# Patient Record
Sex: Female | Born: 1950
Health system: Southern US, Community
[De-identification: ages and names within clinical notes are randomized; demographics above are authoritative.]

## PROBLEM LIST (undated history)

## (undated) DIAGNOSIS — N200 Calculus of kidney: Secondary | ICD-10-CM

## (undated) DIAGNOSIS — F32A Depression, unspecified: Secondary | ICD-10-CM

## (undated) DIAGNOSIS — I10 Essential (primary) hypertension: Secondary | ICD-10-CM

## (undated) DIAGNOSIS — G4700 Insomnia, unspecified: Secondary | ICD-10-CM

## (undated) DIAGNOSIS — G43909 Migraine, unspecified, not intractable, without status migrainosus: Secondary | ICD-10-CM

## (undated) DIAGNOSIS — C801 Malignant (primary) neoplasm, unspecified: Secondary | ICD-10-CM

## (undated) DIAGNOSIS — S0300XA Dislocation of jaw, unspecified side, initial encounter: Secondary | ICD-10-CM

## (undated) DIAGNOSIS — C4490 Unspecified malignant neoplasm of skin, unspecified: Secondary | ICD-10-CM

## (undated) DIAGNOSIS — T7840XA Allergy, unspecified, initial encounter: Secondary | ICD-10-CM

## (undated) DIAGNOSIS — Z87442 Personal history of urinary calculi: Secondary | ICD-10-CM

## (undated) DIAGNOSIS — F419 Anxiety disorder, unspecified: Secondary | ICD-10-CM

## (undated) DIAGNOSIS — C50919 Malignant neoplasm of unspecified site of unspecified female breast: Secondary | ICD-10-CM

## (undated) HISTORY — DX: Malignant (primary) neoplasm, unspecified: C80.1

## (undated) HISTORY — PX: TUBAL LIGATION: SHX77

## (undated) HISTORY — DX: Insomnia, unspecified: G47.00

## (undated) HISTORY — DX: Dislocation of jaw, unspecified side, initial encounter: S03.00XA

## (undated) HISTORY — DX: Depression, unspecified: F32.A

## (undated) HISTORY — PX: SKIN LESION EXCISION: SHX2412

## (undated) HISTORY — DX: Calculus of kidney: N20.0

## (undated) HISTORY — DX: Allergy, unspecified, initial encounter: T78.40XA

## (undated) HISTORY — DX: Unspecified malignant neoplasm of skin, unspecified: C44.90

## (undated) HISTORY — PX: COSMETIC SURGERY: SHX468

## (undated) HISTORY — DX: Essential (primary) hypertension: I10

## (undated) HISTORY — DX: Anxiety disorder, unspecified: F41.9

## (undated) HISTORY — DX: Migraine, unspecified, not intractable, without status migrainosus: G43.909

---

## 2000-01-27 ENCOUNTER — Other Ambulatory Visit: Admission: RE | Admit: 2000-01-27 | Discharge: 2000-01-27 | Payer: Self-pay | Admitting: Family Medicine

## 2001-06-13 ENCOUNTER — Other Ambulatory Visit: Admission: RE | Admit: 2001-06-13 | Discharge: 2001-06-13 | Payer: Self-pay | Admitting: Family Medicine

## 2003-07-30 ENCOUNTER — Other Ambulatory Visit: Admission: RE | Admit: 2003-07-30 | Discharge: 2003-07-30 | Payer: Self-pay | Admitting: Family Medicine

## 2003-11-04 HISTORY — PX: HEMORRHOID SURGERY: SHX153

## 2003-11-09 ENCOUNTER — Ambulatory Visit (HOSPITAL_COMMUNITY): Admission: RE | Admit: 2003-11-09 | Discharge: 2003-11-09 | Payer: Self-pay | Admitting: Gastroenterology

## 2003-11-09 ENCOUNTER — Encounter: Payer: Self-pay | Admitting: Family Medicine

## 2003-11-09 ENCOUNTER — Encounter (INDEPENDENT_AMBULATORY_CARE_PROVIDER_SITE_OTHER): Payer: Self-pay | Admitting: Specialist

## 2003-11-10 ENCOUNTER — Encounter: Payer: Self-pay | Admitting: Family Medicine

## 2004-08-26 ENCOUNTER — Other Ambulatory Visit: Admission: RE | Admit: 2004-08-26 | Discharge: 2004-08-26 | Payer: Self-pay | Admitting: Family Medicine

## 2004-08-26 ENCOUNTER — Ambulatory Visit: Payer: Self-pay | Admitting: Family Medicine

## 2004-11-12 ENCOUNTER — Ambulatory Visit: Payer: Self-pay | Admitting: Family Medicine

## 2005-06-15 ENCOUNTER — Ambulatory Visit: Payer: Self-pay | Admitting: Family Medicine

## 2006-02-15 ENCOUNTER — Ambulatory Visit: Payer: Self-pay | Admitting: Family Medicine

## 2006-02-15 ENCOUNTER — Other Ambulatory Visit: Admission: RE | Admit: 2006-02-15 | Discharge: 2006-02-15 | Payer: Self-pay | Admitting: Family Medicine

## 2006-02-15 ENCOUNTER — Encounter: Payer: Self-pay | Admitting: Family Medicine

## 2007-01-28 ENCOUNTER — Encounter: Payer: Self-pay | Admitting: Family Medicine

## 2007-01-28 DIAGNOSIS — E7800 Pure hypercholesterolemia, unspecified: Secondary | ICD-10-CM | POA: Insufficient documentation

## 2007-01-28 DIAGNOSIS — J309 Allergic rhinitis, unspecified: Secondary | ICD-10-CM | POA: Insufficient documentation

## 2007-01-28 DIAGNOSIS — M26609 Unspecified temporomandibular joint disorder, unspecified side: Secondary | ICD-10-CM | POA: Insufficient documentation

## 2007-01-28 DIAGNOSIS — F419 Anxiety disorder, unspecified: Secondary | ICD-10-CM | POA: Insufficient documentation

## 2007-01-28 DIAGNOSIS — J45909 Unspecified asthma, uncomplicated: Secondary | ICD-10-CM | POA: Insufficient documentation

## 2007-01-28 DIAGNOSIS — Z87898 Personal history of other specified conditions: Secondary | ICD-10-CM | POA: Insufficient documentation

## 2007-02-17 ENCOUNTER — Ambulatory Visit: Payer: Self-pay | Admitting: Family Medicine

## 2007-02-28 LAB — CONVERTED CEMR LAB
ALT: 25 units/L (ref 0–35)
Albumin: 4.5 g/dL (ref 3.5–5.2)
Alkaline Phosphatase: 85 units/L (ref 39–117)
Basophils Absolute: 0 10*3/uL (ref 0.0–0.1)
Cholesterol: 234 mg/dL (ref 0–200)
Eosinophils Absolute: 0.1 10*3/uL (ref 0.0–0.6)
Eosinophils Relative: 1.4 % (ref 0.0–5.0)
HDL: 42.8 mg/dL (ref >39.0)
Lymphocytes Relative: 28 % (ref 12.0–46.0)
MCHC: 34.7 g/dL (ref 30.0–36.0)
MCV: 90.6 fL (ref 78.0–100.0)
Monocytes Relative: 11.3 % — ABNORMAL HIGH (ref 3.0–11.0)
Neutro Abs: 2.6 10*3/uL (ref 1.4–7.7)
Platelets: 326 10*3/uL (ref 150–400)
RBC: 4.33 M/uL (ref 3.87–5.11)
TSH: 0.95 microintl units/mL (ref 0.35–5.50)
Triglycerides: 158 mg/dL — ABNORMAL HIGH (ref 0–149)
WBC: 4.5 10*3/uL (ref 4.5–10.5)

## 2007-03-15 ENCOUNTER — Encounter: Payer: Self-pay | Admitting: Family Medicine

## 2007-03-18 ENCOUNTER — Encounter (INDEPENDENT_AMBULATORY_CARE_PROVIDER_SITE_OTHER): Payer: Self-pay | Admitting: *Deleted

## 2007-03-21 ENCOUNTER — Encounter (INDEPENDENT_AMBULATORY_CARE_PROVIDER_SITE_OTHER): Payer: Self-pay | Admitting: *Deleted

## 2007-04-04 ENCOUNTER — Telehealth: Payer: Self-pay | Admitting: Family Medicine

## 2007-05-05 ENCOUNTER — Ambulatory Visit: Payer: Self-pay | Admitting: Internal Medicine

## 2007-05-09 ENCOUNTER — Telehealth: Payer: Self-pay | Admitting: Internal Medicine

## 2007-05-09 ENCOUNTER — Telehealth: Payer: Self-pay | Admitting: Family Medicine

## 2007-05-09 LAB — CONVERTED CEMR LAB
Basophils Relative: 0.3 % (ref 0.0–1.0)
Eosinophils Absolute: 0.1 10*3/uL (ref 0.0–0.6)
Eosinophils Relative: 2.1 % (ref 0.0–5.0)
HCT: 39.7 % (ref 36.0–46.0)
Hemoglobin: 13.8 g/dL (ref 12.0–15.0)
Lymphocytes Relative: 29.8 % (ref 12.0–46.0)
MCV: 91 fL (ref 78.0–100.0)
Neutro Abs: 3.5 10*3/uL (ref 1.4–7.7)
Neutrophils Relative %: 58.5 % (ref 43.0–77.0)
WBC: 5.8 10*3/uL (ref 4.5–10.5)

## 2007-07-14 ENCOUNTER — Telehealth: Payer: Self-pay | Admitting: Family Medicine

## 2007-09-07 ENCOUNTER — Telehealth: Payer: Self-pay | Admitting: Family Medicine

## 2007-10-18 ENCOUNTER — Encounter: Payer: Self-pay | Admitting: Family Medicine

## 2007-10-31 ENCOUNTER — Telehealth: Payer: Self-pay | Admitting: Family Medicine

## 2007-11-01 ENCOUNTER — Ambulatory Visit: Payer: Self-pay | Admitting: Family Medicine

## 2007-11-08 ENCOUNTER — Ambulatory Visit: Payer: Self-pay | Admitting: Licensed Clinical Social Worker

## 2007-11-18 ENCOUNTER — Ambulatory Visit: Payer: Self-pay | Admitting: Licensed Clinical Social Worker

## 2008-01-02 ENCOUNTER — Ambulatory Visit: Payer: Self-pay | Admitting: Licensed Clinical Social Worker

## 2008-01-10 ENCOUNTER — Ambulatory Visit: Payer: Self-pay | Admitting: Licensed Clinical Social Worker

## 2008-01-12 ENCOUNTER — Encounter: Payer: Self-pay | Admitting: Family Medicine

## 2008-01-12 ENCOUNTER — Emergency Department (HOSPITAL_COMMUNITY): Admission: EM | Admit: 2008-01-12 | Discharge: 2008-01-12 | Payer: Self-pay | Admitting: Emergency Medicine

## 2008-01-15 ENCOUNTER — Telehealth: Payer: Self-pay | Admitting: Family Medicine

## 2008-01-16 ENCOUNTER — Telehealth: Payer: Self-pay | Admitting: Family Medicine

## 2008-01-20 ENCOUNTER — Ambulatory Visit: Payer: Self-pay | Admitting: Family Medicine

## 2008-01-30 ENCOUNTER — Telehealth: Payer: Self-pay | Admitting: Family Medicine

## 2008-02-01 ENCOUNTER — Telehealth (INDEPENDENT_AMBULATORY_CARE_PROVIDER_SITE_OTHER): Payer: Self-pay | Admitting: *Deleted

## 2008-03-13 ENCOUNTER — Telehealth (INDEPENDENT_AMBULATORY_CARE_PROVIDER_SITE_OTHER): Payer: Self-pay | Admitting: *Deleted

## 2008-03-17 ENCOUNTER — Encounter: Payer: Self-pay | Admitting: Family Medicine

## 2008-06-04 ENCOUNTER — Telehealth: Payer: Self-pay | Admitting: Family Medicine

## 2008-08-14 ENCOUNTER — Other Ambulatory Visit: Admission: RE | Admit: 2008-08-14 | Discharge: 2008-08-14 | Payer: Self-pay | Admitting: Family Medicine

## 2008-08-14 ENCOUNTER — Ambulatory Visit: Payer: Self-pay | Admitting: Family Medicine

## 2008-08-14 ENCOUNTER — Encounter: Payer: Self-pay | Admitting: Family Medicine

## 2008-08-14 DIAGNOSIS — R1030 Lower abdominal pain, unspecified: Secondary | ICD-10-CM | POA: Insufficient documentation

## 2008-08-14 DIAGNOSIS — M545 Low back pain, unspecified: Secondary | ICD-10-CM | POA: Insufficient documentation

## 2008-08-14 DIAGNOSIS — R109 Unspecified abdominal pain: Secondary | ICD-10-CM

## 2008-08-14 LAB — HM PAP SMEAR

## 2008-08-15 ENCOUNTER — Encounter: Admission: RE | Admit: 2008-08-15 | Discharge: 2008-08-15 | Payer: Self-pay | Admitting: Family Medicine

## 2008-08-17 ENCOUNTER — Encounter: Payer: Self-pay | Admitting: Family Medicine

## 2008-08-17 ENCOUNTER — Encounter (INDEPENDENT_AMBULATORY_CARE_PROVIDER_SITE_OTHER): Payer: Self-pay | Admitting: *Deleted

## 2008-10-01 ENCOUNTER — Encounter: Payer: Self-pay | Admitting: Family Medicine

## 2009-01-03 ENCOUNTER — Telehealth: Payer: Self-pay | Admitting: Family Medicine

## 2009-05-06 ENCOUNTER — Telehealth: Payer: Self-pay | Admitting: Family Medicine

## 2009-07-09 ENCOUNTER — Telehealth: Payer: Self-pay | Admitting: Family Medicine

## 2009-07-30 ENCOUNTER — Encounter: Payer: Self-pay | Admitting: Family Medicine

## 2009-08-05 ENCOUNTER — Encounter (INDEPENDENT_AMBULATORY_CARE_PROVIDER_SITE_OTHER): Payer: Self-pay | Admitting: *Deleted

## 2009-09-26 ENCOUNTER — Ambulatory Visit: Payer: Self-pay | Admitting: Family Medicine

## 2009-09-26 DIAGNOSIS — D485 Neoplasm of uncertain behavior of skin: Secondary | ICD-10-CM | POA: Insufficient documentation

## 2009-09-30 LAB — HM COLONOSCOPY

## 2009-10-01 LAB — CONVERTED CEMR LAB
ALT: 28 units/L (ref 0–35)
Albumin: 4.7 g/dL (ref 3.5–5.2)
Alkaline Phosphatase: 89 units/L (ref 39–117)
Basophils Relative: 0.7 % (ref 0.0–3.0)
Bilirubin, Direct: 0 mg/dL (ref 0.0–0.3)
CO2: 31 meq/L (ref 19–32)
Calcium: 9.6 mg/dL (ref 8.4–10.5)
Chloride: 102 meq/L (ref 96–112)
Cholesterol: 215 mg/dL — ABNORMAL HIGH (ref 0–200)
Creatinine, Ser: 0.7 mg/dL (ref 0.4–1.2)
Eosinophils Relative: 1.5 % (ref 0.0–5.0)
Hemoglobin: 13.9 g/dL (ref 12.0–15.0)
Lymphocytes Relative: 29.5 % (ref 12.0–46.0)
MCHC: 33.4 g/dL (ref 30.0–36.0)
MCV: 94 fL (ref 78.0–100.0)
Neutro Abs: 2.9 10*3/uL (ref 1.4–7.7)
Neutrophils Relative %: 58.1 % (ref 43.0–77.0)
RBC: 4.42 M/uL (ref 3.87–5.11)
Sodium: 141 meq/L (ref 135–145)
Total CHOL/HDL Ratio: 4
Total Protein: 7.5 g/dL (ref 6.0–8.3)
VLDL: 34 mg/dL (ref 0.0–40.0)
WBC: 5 10*3/uL (ref 4.5–10.5)

## 2010-01-28 ENCOUNTER — Telehealth: Payer: Self-pay | Admitting: Family Medicine

## 2010-04-07 ENCOUNTER — Ambulatory Visit: Payer: Self-pay | Admitting: Family Medicine

## 2010-04-08 LAB — CONVERTED CEMR LAB
ALT: 50 units/L — ABNORMAL HIGH (ref 0–35)
AST: 46 units/L — ABNORMAL HIGH (ref 0–37)
Direct LDL: 132.7 mg/dL
Total CHOL/HDL Ratio: 5
VLDL: 33.4 mg/dL (ref 0.0–40.0)

## 2010-04-23 ENCOUNTER — Telehealth (INDEPENDENT_AMBULATORY_CARE_PROVIDER_SITE_OTHER): Payer: Self-pay | Admitting: *Deleted

## 2010-05-23 ENCOUNTER — Telehealth: Payer: Self-pay | Admitting: Family Medicine

## 2010-08-05 NOTE — Miscellaneous (Signed)
Summary: mammogram results  Clinical Lists Changes  Observations: Added new observation of MAMMO DUE: 08/2010 (08/05/2009 9:21) Added new observation of MAMMOGRAM: normal (08/05/2009 9:21)      Preventive Care Screening  Mammogram:    Date:  08/05/2009    Next Due:  08/2010    Results:  normal

## 2010-08-05 NOTE — Progress Notes (Signed)
Summary: Zolpidem 10mg   Phone Note Refill Request Call back at [phone] Message from:  [facility] # [id number] on May 23, 2010 10:15 AM  Refills Requested: Medication #1:  AMBIEN 10 MG  TABS 1/2 by mouth at bedtime   Last Refilled: 04/14/2010 [facility] [address] [phone] electronically request refill on Zolpidem 10mg . Pt was given refill for 1 yr at 09/26/09. Denise at [facility] said since controlled med can only refill for 6 months at a time.Please advise.    Method Requested: Telephone to Pharmacy Initial call taken by: Lewanda Rife LPN,  May 23, 2010 10:17 AM  Follow-up for Phone Call        px written on EMR for call in  Follow-up by: Judith Part MD,  May 23, 2010 11:38 AM  Additional Follow-up for Phone Call Additional follow up Details #1::        Medication phoned to [facility] [phone] pharmacy as instructed. Lewanda Rife LPN  May 23, 2010 12:06 PM     Prescriptions: AMBIEN 10 MG  TABS (ZOLPIDEM TARTRATE) 1/2 by mouth at bedtime  #15 x 5   Entered and Authorized by:   Judith Part MD   Signed by:   Lewanda Rife LPN on 27/25/3664   Method used:   Telephoned to ...       [facility]  [address]  [phone]* (retail)       [address]       [place], [place]  [address]       Ph: [phone] or [phone]       Fax: [phone]   RxID:   [id number]

## 2010-08-05 NOTE — Assessment & Plan Note (Signed)
Summary: CPX   Vital Signs:  Patient profile:   60 year old female Height:      64.75 inches Weight:      140.75 pounds BMI:     23.69 Temp:     97.7 degrees F oral Pulse rate:   80 / minute Pulse rhythm:   regular BP sitting:   140 / 88  (left arm) Cuff size:   regular  Vitals Entered By: Lewanda Rife LPN (September 26, 2009 10:38 AM)  Serial Vital Signs/Assessments:  Time      Position  BP       Pulse  Resp  Temp     By                     135/85                         Judith Part MD  CC: complete physical with pap and breast exam LMP 2006   History of Present Illness: here for health mt exam and to rev chronic health problems  has been ok in general  no new problems - health is good   wt is up 10 lb with bmi of 23  bp 140/88 first check today  usual stress/ anx -- coming here   colonosc polyp 05--   due for lipids  diet is good - overall doing a lot better with that  walks daily -- and enjoys that at least 4 times per week   pap 2/10 - no gyn problems last 3 paps were neg   mam 1/11-- neg  self exam - no lumps or changes   dexa nl in 08 not taking ca and vit D   Td 05  skin spot to check on chest has had a skin cancer in the past   has had TMJ for years -- really painful / watches what she eats  does not have a bit plate  thinks it is stress related   needs a rescue inhaler to have on hand for colds   Allergies (verified): No Known Drug Allergies  Past History:  Past Surgical History: Last updated: 01/28/2007 Tubal ligation Colonoscopy- polyp, hemorrhoids (11/2003) Skin lesion removed  Family History: Last updated: 02/17/2007 Father: mild depression Mother: likely depression Siblings: cholesterol, ?OP MGM MI, breast ca GF stomach ca daughter colon polyps, kidney stones Sister- thyroid ca  Social History: Last updated: 11/01/2007 Marital Status: divorced after 23 years, has new partner non smoker Children: 2 Occupation:   Risk  Factors: Smoking Status: never (01/28/2007)  Past Medical History: Allergic rhinitis Anxiety Asthma- when she gets uri  TMJ migraines  past skin ca- ? basal cell  insomnia   GI - [facility]  Review of Systems General:  Denies fatigue, fever, loss of appetite, and malaise. Eyes:  Denies blurring and eye pain. CV:  Denies chest pain or discomfort, palpitations, shortness of breath with exertion, and swelling of feet. Resp:  Denies cough and wheezing. GI:  Denies abdominal pain, bloody stools, change in bowel habits, indigestion, and nausea. GU:  Denies abnormal vaginal bleeding, discharge, dysuria, and urinary frequency. MS:  Denies joint pain, joint redness, and joint swelling. Derm:  Denies itching, lesion(s), poor wound healing, and rash. Neuro:  Denies headaches, numbness, and tingling. Psych:  mood is ok . Endo:  Denies cold intolerance, excessive thirst, excessive urination, and heat intolerance. Heme:  Denies abnormal bruising and bleeding.  Physical  Exam  General:  Well-developed,well-nourished,in no acute distress; alert,appropriate and cooperative throughout examination Head:  normocephalic, atraumatic, and no abnormalities observed.   Eyes:  vision grossly intact, pupils equal, pupils round, and pupils reactive to light.  no conjunctival pallor, injection or icterus  Ears:  R ear normal and L ear normal.   Nose:  no nasal discharge.   Mouth:  pharynx pink and moist.   Neck:  supple with full rom and no masses or thyromegally, no JVD or carotid bruit  Chest Wall:  No deformities, masses, or tenderness noted. Breasts:  No mass, nodules, thickening, tenderness, bulging, retraction, inflamation, nipple discharge or skin changes noted.   Lungs:  Normal respiratory effort, chest expands symmetrically. Lungs are clear to auscultation, no crackles or wheezes. Heart:  Normal rate and regular rhythm. S1 and S2 normal without gallop, murmur, click, rub or other extra sounds. Abdomen:   Bowel sounds positive,abdomen soft and non-tender without masses, organomegaly or hernias noted. no renal bruits  Msk:  No deformity or scoliosis noted of thoracic or lumbar spine.  no acute joint change Pulses:  R and L carotid,radial,femoral,dorsalis pedis and posterior tibial pulses are full and equal bilaterally Extremities:  No clubbing, cyanosis, edema, or deformity noted with normal full range of motion of all joints.   Neurologic:  sensation intact to light touch, gait normal, and DTRs symmetrical and normal.   Skin:  3-4 mm raised smooth translucent skin lesion on mid chest  lentigos diffusely  Cervical Nodes:  No lymphadenopathy noted Axillary Nodes:  No palpable lymphadenopathy Inguinal Nodes:  No significant adenopathy Psych:  normal affect, talkative and pleasant    Impression & Recommendations:  Problem # 1:  HEALTH MAINTENANCE EXAM (ICD-V70.0) Assessment Comment Only  reviewed health habits including diet, exercise and skin cancer prevention reviewed health maintenance list and family history labs today   Orders: Venipuncture (57322) TLB-Lipid Panel (80061-LIPID) TLB-BMP (Basic Metabolic Panel-BMET) (80048-METABOL) TLB-CBC Platelet - w/Differential (85025-CBCD) TLB-Hepatic/Liver Function Pnl (80076-HEPATIC) TLB-TSH (Thyroid Stimulating Hormone) (84443-TSH)  Problem # 2:  Hx of HYPERCHOLESTEROLEMIA (ICD-272.0) Assessment: Unchanged  labs today has been fairly controlled with statin and diet lab today and adv  rev low sat fat diet  Her updated medication list for this problem includes:    Zocor 40 Mg Tabs (Simvastatin) .Marland Kitchen... Take one by mouth daily  Orders: Venipuncture (02542) TLB-Lipid Panel (80061-LIPID) TLB-BMP (Basic Metabolic Panel-BMET) (80048-METABOL) TLB-CBC Platelet - w/Differential (85025-CBCD) TLB-Hepatic/Liver Function Pnl (80076-HEPATIC) TLB-TSH (Thyroid Stimulating Hormone) (70623-JSE) Prescription Created Electronically [phone])  Labs  Reviewed: SGOT: 25 (02/17/2007)   SGPT: 25 (02/17/2007)   HDL:42.8 (02/17/2007)  LDL:DEL (02/17/2007)  Chol:234 (02/17/2007)  Trig:158 (02/17/2007)  Problem # 3:  ANXIETY (ICD-300.00) Assessment: Unchanged refil meds alprazolam is sparingly-- knows not to mix with ambien overall 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 1 by mouth at bedtime    Alprazolam 0.25 Mg Tabs (Alprazolam) .Marland Kitchen... Take one tablet by mouth twice a day as needed severe anxiety  Problem # 4:  NEOPLASM, SKIN, UNCERTAIN BEHAVIOR (ICD-238.2) Assessment: New lesion on chest is translucent and irregular- ref to derm prev hx of ? basal cell lesion Orders: Dermatology Referral (Derma)  Complete Medication List: 1)  Zocor 40 Mg Tabs (Simvastatin) .... Take one by mouth daily 2)  Ambien 10 Mg Tabs (Zolpidem tartrate) .... 1/2 by mouth at bedtime 3)  Zoloft 50 Mg Tabs (Sertraline  hcl) .... 1 by mouth at bedtime 4)  Tizanidine Hcl 4 Mg Tabs (Tizanidine hcl) .Marland Kitchen.. 1 by mouth at bedtime as needed 5)  Alprazolam 0.25 Mg Tabs (Alprazolam) .... Take one tablet by mouth twice a day as needed severe anxiety 6)  Proair Hfa 108 (90 Base) Mcg/act Aers (Albuterol sulfate) .... 2 puffs up to every 4 hours as needed wheeze  Patient Instructions: 1)  please pull old chart to find last colonoscopy and put on my desk  2)  the current recommendation for calcium intake is 1200-1500 mg daily with 1000 IIU of vitamin D  3)  keep up healthy diet and exercise 4)  we will do ref to derm at check out  5)  labs today  Prescriptions: ALPRAZOLAM 0.25 MG TABS (ALPRAZOLAM) take one tablet by mouth twice a day as needed severe anxiety  #30 x 0   Entered and Authorized by:   Judith Part MD   Signed by:   Judith Part MD on 09/26/2009   Method used:   Print then Give to Patient   RxID:   [id number] ZOLOFT 50 MG  TABS (SERTRALINE HCL) 1  by mouth at bedtime  #30 x 11   Entered and Authorized by:   Judith Part MD   Signed by:   Judith Part MD on 09/26/2009   Method used:   Electronically to        [facility]  [phone]* (retail)       [address]       [place], [place]  [address]       Ph: [phone] or [phone]       Fax: [phone]   RxID:   [id number] AMBIEN 10 MG  TABS (ZOLPIDEM TARTRATE) 1/2 by mouth at bedtime  #15 x 11   Entered and Authorized by:   Judith Part MD   Signed by:   Judith Part MD on 09/26/2009   Method used:   Print then Give to Patient   RxID:   [id number] PROAIR HFA 108 (90 BASE) MCG/ACT AERS (ALBUTEROL SULFATE) 2 puffs up to every 4 hours as needed wheeze  #1 mdi x 11   Entered and Authorized by:   Judith Part MD   Signed by:   Judith Part MD on 09/26/2009   Method used:   Electronically to        [facility]  [phone]* (retail)       [address]       [place], [place]  [address]       Ph: [phone] or [phone]       Fax: [phone]   RxID:   [id number]   Current Allergies (reviewed today): No known allergies

## 2010-08-05 NOTE — Procedures (Signed)
Summary: Colonoscopy w/ biopsies x2 by Dr.Jyothi Mann  Colonoscopy w/ biopsies x2 by Dr.Jyothi Mann   Imported By: [name] 09/30/2009 10:10:49  _____________________________________________________________________  External Attachment:    Type:   Image     Comment:   External Document  Appended Document: Colonoscopy w/ biopsies x2 by Dr.Jyothi [name]    Clinical Lists Changes  Observations: Added new observation of PAST MED HX: Allergic rhinitis Anxiety Asthma- when she gets uri  TMJ migraines  past skin ca- ? basal cell  insomnia   GI - [name] (09/30/2009 21:53) Added new observation of COLONNXTDUE: 11/2013 (09/30/2009 21:53) Added new observation of COLONOSCOPY: Hyperplastic Polyp (11/10/2003 21:54)       Preventive Care Screening  Colonoscopy:    Date:  11/10/2003    Next Due:  11/2013    Results:  Hyperplastic Polyp   Past History:  Past Medical History: Allergic rhinitis Anxiety Asthma- when she gets uri  TMJ migraines  past skin ca- ? basal cell  insomnia   GI - [name]

## 2010-08-05 NOTE — Progress Notes (Signed)
Summary: Zolpidem 10mg  refill  Phone Note Refill Request Call back at [phone] Message from:  [facility] [address] # [id number] on January 28, 2010 11:45 AM  Refills Requested: Medication #1:  AMBIEN 10 MG  TABS 1/2 by mouth at bedtime   Last Refilled: 11/27/2009 [facility] [address]  # [id number] electronically request refill for Zolpidem 10mg  Last refill date 11/27/09.Please advise.    Method Requested: Telephone to Pharmacy Initial call taken by: Lewanda Rife LPN,  January 28, 2010 11:46 AM  Follow-up for Phone Call        I have that I gave 15 (a months worth) with 11 ref on march 24/ 2011?  Follow-up by: Judith Part MD,  January 28, 2010 12:08 PM  Additional Follow-up for Phone Call Additional follow up Details #1::        Spoke with Lorene Dy at [facility] and she found the 09/2009 rx on hold. she will fill for pt.Lewanda Rife LPN  January 28, 2010 12:35 PM

## 2010-08-05 NOTE — Progress Notes (Signed)
Summary: Ambien  Phone Note Refill Request Message from:  [facility] on July 09, 2009 10:41 AM  Refills Requested: Medication #1:  AMBIEN 10 MG  TABS 1/2 to 1 by mouth at bedtime prn [facility]  [address]  #[id number]*   Last Fill Date:  05/04/2009   Pharmacy Phone:  [phone]   Method Requested: Telephone to Pharmacy Initial call taken by: Delilah Shan CMA (AAMA),  July 09, 2009 10:41 AM  Follow-up for Phone Call        px written on EMR for call in  Follow-up by: Judith Part MD,  July 09, 2009 12:20 PM  Additional Follow-up for Phone Call Additional follow up Details #1::        [facility] Additional Follow-up by: Lowella Petties CMA,  July 09, 2009 12:38 PM    Prescriptions: AMBIEN 10 MG  TABS (ZOLPIDEM TARTRATE) 1/2 to 1 by mouth at bedtime prn  #30 x 3   Entered and Authorized by:   Judith Part MD   Signed by:   Lowella Petties CMA on 07/09/2009   Method used:   Telephoned to ...       [facility]  [address]  [phone]* (retail)       [address]       [place], [place]  [address]       Ph: [phone] or [phone]       Fax: [phone]   RxID:   [id number]

## 2010-08-05 NOTE — Progress Notes (Signed)
Summary: No showed for  the Derm appt scheduled in March 2011  Phone Note Outgoing Call   Summary of Call: Pt had a scheduled appt to the Dermologist..Marland KitchenNo showed.Daine Gip  April 23, 2010 2:35 PM Initial call taken by: Daine Gip,  April 23, 2010 2:35 PM  Follow-up for Phone Call        please check in with her to see what happened Follow-up by: Judith Part MD,  April 23, 2010 3:11 PM  Additional Follow-up for Phone Call Additional follow up Details #1::        Sp w/ pt, not interested in going to the Dermotogist, asked for a reason. Says she just did not want to go.Daine Gip  April 24, 2010 9:01 AM  I do think she should have lesion on chest checked out by derm -- but of course that is her choice  --MT  Additional Follow-up by: Daine Gip,  April 24, 2010 9:01 AM    Additional Follow-up for Phone Call Additional follow up Details #2::    Spoke w/ pt told her the importance of this appt per Dr. Milinda Antis. Says she will call and reschedule the appt. Did not want the office to do so.Daine Gip  May 09, 2010 10:22 AM  Follow-up by: Daine Gip,  May 09, 2010 10:22 AM

## 2010-09-18 ENCOUNTER — Encounter: Payer: Self-pay | Admitting: Family Medicine

## 2010-09-18 LAB — HM MAMMOGRAPHY: HM Mammogram: NORMAL

## 2010-09-22 ENCOUNTER — Encounter: Payer: Self-pay | Admitting: Family Medicine

## 2010-10-02 NOTE — Letter (Signed)
Summary: Results Follow up Letter  [place] [facility]  [address] [place], [place] [address]   Phone: [phone]  Fax: [phone]    09/22/2010 MRN: [id number]    Taylor Ewing [address] [place], [place]  [address]    Dear Ms. Funez,  The following are the results of your recent test(s):  Test         Result    Pap Smear:        Normal _____  Not Normal _____ Comments: ______________________________________________________ Cholesterol: LDL(Bad cholesterol):         Your goal is less than:         HDL (Good cholesterol):       Your goal is more than: Comments:  ______________________________________________________ Mammogram:        Normal __X__  Not Normal _____ Comments:Repeat in one year.  ___________________________________________________________________ Hemoccult:        Normal _____  Not normal _______ Comments:    _____________________________________________________________________ Other Tests:    We routinely do not discuss normal results over the telephone.  If you desire a copy of the results, or you have any questions about this information we can discuss them at your next office visit.   Sincerely,    Idamae Schuller Chesley Veasey,MD  MT/ri

## 2010-11-20 ENCOUNTER — Other Ambulatory Visit: Payer: Self-pay | Admitting: Family Medicine

## 2010-11-20 NOTE — Telephone Encounter (Signed)
Medication phoned to [facility] as instructed.

## 2010-11-20 NOTE — Telephone Encounter (Signed)
Px written for call in   

## 2010-11-21 NOTE — Op Note (Signed)
NAME:  Taylor Ewing, Taylor Ewing                        ACCOUNT NO.:  [id number]   MEDICAL RECORD NO.:  [id number]                   PATIENT TYPE:  AMB   LOCATION:  ENDO                                 FACILITY:  [facility]   PHYSICIAN:  Anselmo Rod, M.D.               DATE OF BIRTH:  03/20/1951   DATE OF PROCEDURE:  11/10/2003  DATE OF DISCHARGE:                                 OPERATIVE REPORT   PROCEDURE PERFORMED:  Colonoscopy with biopsies x2.   ENDOSCOPIST:  Anselmo Rod, M.D.   INSTRUMENT USED:  Pediatric adjustable Olympus colonoscope.   INDICATION FOR PROCEDURE:  Occasional BRBPR in a 60 year old female  undergoing a screening colonoscopy.  Rule out colonic polyps, masses, etc.   PREPROCEDURE PHYSICAL:  Informed consent was procured from the patient.  The  patient was fasted for eight hours prior to the procedure and prepped with a  bottle of magnesium citrate and a gallon of GoLYTELY the night prior to the  procedure.   PREPROCEDURE PHYSICAL:  VITAL SIGNS:  The patient had stable vital signs.  NECK:  Supple.  CHEST:  Clear to auscultation.  S1, S2 regular.  ABDOMEN:  Soft with normal bowel sounds.   DESCRIPTION OF PROCEDURE:  The patient was placed in the left lateral  decubitus position and sedated with 50 mg of Demerol and 6 mg of Versed in  slow incremental doses.  Once the patient was adequately sedate and  maintained on low-flow oxygen and continuous cardiac monitoring, the Olympus  video colonoscope was advanced from the rectum to the cecum without  difficulty.  The patient had a small sessile polyp in the rectosigmoid colon  that was biopsied for pathology.  The rest of the exam was normal.  Small  internal hemorrhoids were seen on retroflexion in the rectum.  The  appendiceal orifice and the ileocecal valve were clearly visualized and  photographed.  The patient tolerated the procedure well without immediate  complications.   IMPRESSION:  1. Small internal  hemorrhoids.  2. Small sessile polyp, biopsied from the rectosigmoid colon.  3. Otherwise normal colonoscopy up to the cecum.   RECOMMENDATIONS:  1. Continue a high-fiber diet with liberal fluid intake.  2. Repeat CRC screening depending on pathology results.  3. Outpatient follow-up as the need arises in the future.                                               Anselmo Rod, M.D.    JNM/MEDQ  D:  11/09/2003  T:  11/10/2003  Job:  [id number]   cc:   Marne A. Milinda Antis, M.D. [facility]

## 2010-12-01 ENCOUNTER — Other Ambulatory Visit: Payer: Self-pay | Admitting: Family Medicine

## 2010-12-03 NOTE — Telephone Encounter (Signed)
Medication electronically sent in on 12/01/10.

## 2010-12-03 NOTE — Telephone Encounter (Signed)
Pt must call for appt. 

## 2011-01-01 ENCOUNTER — Other Ambulatory Visit: Payer: Self-pay | Admitting: Dermatology

## 2011-01-13 ENCOUNTER — Other Ambulatory Visit: Payer: Self-pay | Admitting: Family Medicine

## 2011-02-18 ENCOUNTER — Other Ambulatory Visit: Payer: Self-pay | Admitting: *Deleted

## 2011-02-18 MED ORDER — ZOLPIDEM TARTRATE 10 MG PO TABS: 5.0000 mg | ORAL_TABLET | Freq: Every evening | ORAL | Status: DC | PRN

## 2011-02-18 MED ORDER — SERTRALINE HCL 50 MG PO TABS: 50.0000 mg | ORAL_TABLET | Freq: Every day | ORAL | Status: DC

## 2011-02-18 MED ORDER — SIMVASTATIN 40 MG PO TABS: 40.0000 mg | ORAL_TABLET | Freq: Every day | ORAL | Status: DC

## 2011-02-18 NOTE — Telephone Encounter (Signed)
Pt is asking for new 90 day written scripts to send to mail order pharmacy.  Please call when ready.

## 2011-02-18 NOTE — Telephone Encounter (Signed)
Px printed for pick up in IN box  

## 2011-02-18 NOTE — Telephone Encounter (Signed)
Left v/m for pt to call back. Prescription left at front desk.  

## 2011-02-19 NOTE — Telephone Encounter (Signed)
Patient notified as instructed by telephone. 

## 2011-04-02 LAB — POCT I-STAT, CHEM 8
BUN: 11
Potassium: 4.2
Sodium: 140
TCO2: 23

## 2011-04-02 LAB — POCT CARDIAC MARKERS
CKMB, poc: 1.4
Myoglobin, poc: 315
Operator id: 146091

## 2011-04-02 LAB — DIFFERENTIAL
Basophils Absolute: 0
Eosinophils Relative: 1
Lymphocytes Relative: 26
Neutro Abs: 4.4
Neutrophils Relative %: 64

## 2011-04-02 LAB — CBC
HCT: 41.3
Platelets: 387
RDW: 12.7
WBC: 6.9

## 2011-05-22 ENCOUNTER — Other Ambulatory Visit: Payer: Self-pay | Admitting: Family Medicine

## 2011-05-22 NOTE — Telephone Encounter (Signed)
Px written for call in   

## 2011-05-25 NOTE — Telephone Encounter (Signed)
Medication phoned to [facility] as instructed.

## 2011-08-24 ENCOUNTER — Other Ambulatory Visit: Payer: Self-pay | Admitting: Family Medicine

## 2011-08-25 NOTE — Telephone Encounter (Signed)
?   No visit since 3/11 ? Confirm, if so schedule f/u Px written for call in

## 2011-08-25 NOTE — Telephone Encounter (Signed)
Medication phoned to [facility] pharmacy as instructed. Jamie please schedule f/u appt for pt.

## 2011-08-25 NOTE — Telephone Encounter (Signed)
Received refill request electronically from pharmacy. Is it okay to refill medication? 

## 2011-08-26 NOTE — Telephone Encounter (Signed)
Spoke with pt and scheduled at f/u

## 2011-09-10 ENCOUNTER — Ambulatory Visit: Payer: BC Managed Care – PPO | Admitting: Family Medicine

## 2011-09-10 VITALS — BP 181/98 | HR 106 | Temp 98.1°F | Resp 20 | Ht 64.5 in | Wt 141.8 lb

## 2011-09-10 DIAGNOSIS — S81009A Unspecified open wound, unspecified knee, initial encounter: Secondary | ICD-10-CM

## 2011-09-10 DIAGNOSIS — S91009A Unspecified open wound, unspecified ankle, initial encounter: Secondary | ICD-10-CM

## 2011-09-10 DIAGNOSIS — S81802A Unspecified open wound, left lower leg, initial encounter: Secondary | ICD-10-CM

## 2011-09-10 NOTE — Progress Notes (Signed)
  Subjective:    Patient ID: Taylor Ewing, female    DOB: [birth year], 61 y.o.   MRN: [id number]  HPI 61 yo female here for dog bite.  Walking outside and passed dog in front of a house and ran at her and bit her left lower leg. Large mixed breed.   No person with the dog and didn't recognize the dog.   She called the police who called animal control.  She hasn't heard from them yet to see if they captured it or contacted the owner to see if it is up to date on shots.   Has not yet cleaned or treated the wound.    Review of Systems Negative except as per HPI     Objective:   Physical Exam  Constitutional: She appears well-developed.  Pulmonary/Chest: Effort normal.  Neurological: She is alert.    Left lower lateral leg - broken skin with edema surrounding it.  No bleeding.  Tender to palpation.       Assessment & Plan:  Dog bite, unknown rabies status.  Advised patient to wait remainder of day today to hear from animal control about dog's status.  If she doesn't hear by tomorrow or status remains unknown then go to [facility] ED for rabies series.  Patient agrees and understands.

## 2011-09-24 ENCOUNTER — Encounter: Payer: Self-pay | Admitting: Family Medicine

## 2011-09-28 ENCOUNTER — Encounter: Payer: Self-pay | Admitting: Family Medicine

## 2011-09-28 ENCOUNTER — Ambulatory Visit (INDEPENDENT_AMBULATORY_CARE_PROVIDER_SITE_OTHER): Payer: Self-pay | Admitting: Family Medicine

## 2011-09-28 VITALS — BP 132/82 | HR 71 | Temp 97.8°F | Ht 64.5 in | Wt 145.0 lb

## 2011-09-28 DIAGNOSIS — F411 Generalized anxiety disorder: Secondary | ICD-10-CM

## 2011-09-28 DIAGNOSIS — E7800 Pure hypercholesterolemia, unspecified: Secondary | ICD-10-CM

## 2011-09-28 DIAGNOSIS — Z1239 Encounter for other screening for malignant neoplasm of breast: Secondary | ICD-10-CM | POA: Insufficient documentation

## 2011-09-28 LAB — LIPID PANEL
HDL: 45.9 mg/dL (ref >39.00)
Total CHOL/HDL Ratio: 5
Triglycerides: 233 mg/dL — ABNORMAL HIGH (ref 0.0–149.0)
VLDL: 46.6 mg/dL — ABNORMAL HIGH (ref 0.0–40.0)

## 2011-09-28 LAB — COMPREHENSIVE METABOLIC PANEL
ALT: 31 U/L (ref 0–35)
AST: 27 U/L (ref 0–37)
Alkaline Phosphatase: 81 U/L (ref 39–117)
Calcium: 9.4 mg/dL (ref 8.4–10.5)
Chloride: 106 mEq/L (ref 96–112)
Creatinine, Ser: 0.8 mg/dL (ref 0.4–1.2)

## 2011-09-28 LAB — LDL CHOLESTEROL, DIRECT: Direct LDL: 145.2 mg/dL

## 2011-09-28 MED ORDER — SIMVASTATIN 40 MG PO TABS: 40.0000 mg | ORAL_TABLET | Freq: Every day | ORAL | Status: DC

## 2011-09-28 MED ORDER — SERTRALINE HCL 50 MG PO TABS: 50.0000 mg | ORAL_TABLET | Freq: Every day | ORAL | Status: DC

## 2011-09-28 NOTE — Assessment & Plan Note (Signed)
Pt is overdue on her yearly mammo She wants to schedule this herself Disc imp of self exams  No reported problems

## 2011-09-28 NOTE — Assessment & Plan Note (Signed)
Due for lab today On simvastatin and diet (no trouble with 40 mg- long term dose) Disc low sat fat diet  refil med done

## 2011-09-28 NOTE — Patient Instructions (Signed)
Labs today Avoid red meat/ fried foods/ egg yolks/ fatty breakfast meats/ butter, cheese and high fat dairy/ and shellfish   Aim to start exercise 5 days per week for 30 minutes Be sure to schedule your own mammogram  Will send px to your pharmacy

## 2011-09-28 NOTE — Assessment & Plan Note (Signed)
Doing well with zoloft without change This was refilled Disc imp of exercise and good health habits and sleep

## 2011-09-28 NOTE — Progress Notes (Signed)
Subjective:    Patient ID: Taylor Ewing, female    DOB: Mar 15, 1951, 61 y.o.   MRN: [id number]  HPI Here for follow up - doing pretty well overall  Nothing new going on   Had a dog bite several weeks ago  Wound is healing ok  No rabies exposure  Was treated with an abx   Wt is up 4 lb with bmi of 24   Needs to check cholesterol today Is on simvastatin without problems  Does try to eat a low sat fat diet    Still taking zoloft for anxiety  Symptoms are well controlled Walking for exercise- helps too - needs to do more often  Overall stress is about the same    Did not get flu shot this year   Is due for her mammo yearly  She will schedule her own  No breast pain or d/c No lumps on self exam   Patient Active Problem List  Diagnoses  . NEOPLASM, SKIN, UNCERTAIN BEHAVIOR  . HYPERCHOLESTEROLEMIA  . ANXIETY  . ALLERGIC RHINITIS  . ASTHMA  . TMJ SYNDROME  . LOW BACK PAIN, CHRONIC  . PELVIC PAIN, CHRONIC  . MIGRAINES, HX OF   Past Medical History  Diagnosis Date  . Allergy   . Anxiety   . Asthma   . TMJ (dislocation of temporomandibular joint)   . Migraines   . Cancer     skin, basal cell  . Insomnia    Past Surgical History  Procedure Date  . Tubal ligation   . Skin lesion excision   . Hemorrhoid surgery 11/2003   History  Substance Use Topics  . Smoking status: Never Smoker   . Smokeless tobacco: Not on file  . Alcohol Use: Yes     1-2 weekly   Family History  Problem Relation Age of Onset  . Depression Mother   . Depression Father   . Hyperlipidemia Sister   . Cancer Sister     thyroid  . Hyperlipidemia Brother   . Cancer Maternal Grandmother     breast  . Heart attack Maternal Grandmother   . Nephrolithiasis Daughter   . Colon polyps Daughter   . Cancer Maternal Grandfather     stomach   No Known Allergies Current Outpatient Prescriptions on File Prior to Visit  Medication Sig Dispense Refill  . albuterol (PROVENTIL HFA;VENTOLIN  HFA) 108 (90 BASE) MCG/ACT inhaler Inhale 2 puffs into the lungs every 4 (four) hours as needed.      Marland Kitchen tiZANidine (ZANAFLEX) 4 MG tablet Take 4 mg by mouth at bedtime.      Marland Kitchen zolpidem (AMBIEN) 10 MG tablet TAKE ONE-HALF TABLET BY MOUTH AT BEDTIME AS NEEDED FOR SLEEP  45 tablet  0      Review of Systems Review of Systems  Constitutional: Negative for fever, appetite change, fatigue and unexpected weight change.  Eyes: Negative for pain and visual disturbance.  Respiratory: Negative for cough and shortness of breath.   Cardiovascular: Negative for cp or palpitations    Gastrointestinal: Negative for nausea, diarrhea and constipation.  Genitourinary: Negative for urgency and frequency.  Skin: Negative for pallor or rash   Neurological: Negative for weakness, light-headedness, numbness and headaches.  Hematological: Negative for adenopathy. Does not bruise/bleed easily.  Psychiatric/Behavioral: Negative for dysphoric mood. The patient is not anxious- that is under good control        Objective:   Physical Exam  Constitutional: She appears well-developed and well-nourished. No  distress.  HENT:  Head: Normocephalic and atraumatic.  Mouth/Throat: Oropharynx is clear and moist.  Eyes: Conjunctivae and EOM are normal. Pupils are equal, round, and reactive to light. No scleral icterus.  Neck: Normal range of motion. Neck supple. No JVD present. Carotid bruit is not present. No thyromegaly present.  Cardiovascular: Normal rate, regular rhythm, normal heart sounds and intact distal pulses.  Exam reveals no gallop.   Pulmonary/Chest: Effort normal and breath sounds normal. No respiratory distress. She has no wheezes.  Abdominal: Soft. Bowel sounds are normal. She exhibits no distension, no abdominal bruit and no mass. There is no tenderness.  Musculoskeletal: She exhibits no edema.  Lymphadenopathy:    She has no cervical adenopathy.  Neurological: She is alert. She has normal reflexes.    Skin: Skin is warm and dry. No rash noted. No erythema. No pallor.  Psychiatric: She has a normal mood and affect.          Assessment & Plan:

## 2011-10-13 ENCOUNTER — Encounter: Payer: Self-pay | Admitting: Family Medicine

## 2011-10-14 ENCOUNTER — Encounter: Payer: Self-pay | Admitting: *Deleted

## 2011-11-21 ENCOUNTER — Other Ambulatory Visit: Payer: Self-pay | Admitting: Family Medicine

## 2011-11-23 NOTE — Telephone Encounter (Signed)
Px written for call in   

## 2011-11-23 NOTE — Telephone Encounter (Signed)
Rx called to [facility]. 

## 2012-02-10 ENCOUNTER — Encounter: Payer: Self-pay | Admitting: Family Medicine

## 2012-02-10 ENCOUNTER — Ambulatory Visit (INDEPENDENT_AMBULATORY_CARE_PROVIDER_SITE_OTHER): Payer: Self-pay | Admitting: Family Medicine

## 2012-02-10 VITALS — BP 154/90 | HR 78 | Temp 97.9°F | Ht 64.0 in | Wt 146.2 lb

## 2012-02-10 DIAGNOSIS — E7800 Pure hypercholesterolemia, unspecified: Secondary | ICD-10-CM

## 2012-02-10 DIAGNOSIS — I10 Essential (primary) hypertension: Secondary | ICD-10-CM

## 2012-02-10 LAB — COMPREHENSIVE METABOLIC PANEL
AST: 35 U/L (ref 0–37)
Albumin: 4.7 g/dL (ref 3.5–5.2)
Alkaline Phosphatase: 83 U/L (ref 39–117)
BUN: 16 mg/dL (ref 6–23)
Creatinine, Ser: 0.7 mg/dL (ref 0.4–1.2)
Glucose, Bld: 89 mg/dL (ref 70–99)
Potassium: 4.4 mEq/L (ref 3.5–5.1)

## 2012-02-10 LAB — CBC WITH DIFFERENTIAL/PLATELET
Basophils Absolute: 0 10*3/uL (ref 0.0–0.1)
Eosinophils Relative: 1.4 % (ref 0.0–5.0)
HCT: 40.2 % (ref 36.0–46.0)
Lymphocytes Relative: 27.1 % (ref 12.0–46.0)
Monocytes Relative: 10.6 % (ref 3.0–12.0)
Neutrophils Relative %: 60.3 % (ref 43.0–77.0)
Platelets: 297 10*3/uL (ref 150.0–400.0)
RDW: 13 % (ref 11.5–14.6)
WBC: 5.9 10*3/uL (ref 4.5–10.5)

## 2012-02-10 LAB — LIPID PANEL
Cholesterol: 179 mg/dL (ref 0–200)
Triglycerides: 208 mg/dL — ABNORMAL HIGH (ref 0.0–149.0)
VLDL: 41.6 mg/dL — ABNORMAL HIGH (ref 0.0–40.0)

## 2012-02-10 LAB — TSH: TSH: 0.75 u[IU]/mL (ref 0.35–5.50)

## 2012-02-10 MED ORDER — LISINOPRIL 10 MG PO TABS: 10.0000 mg | ORAL_TABLET | Freq: Every day | ORAL | Status: DC

## 2012-02-10 NOTE — Progress Notes (Signed)
Subjective:    Patient ID: Taylor Ewing, female    DOB: [birth year], 61 y.o.   MRN: [id number]  HPI Here for f/u of chronic problems  Wt is up 1 lb with good bmi of 25  Is feeling ok overall  Wanted to have her bp checked No prior hx of high bp  Was on vacation with relatives - checking for fun -- 170/100, re check later the same  When she got home checked at [facility] 155/100 BP Readings from Last 3 Encounters:  02/10/12 140/88  09/28/11 132/82  09/10/11 181/98    Not really stressed lately more than usual (her job) Exercise -now getting better, walking 35-40 minutes -brisk pace  No headaches or ankle swelling  At times she will get a strange sensation going up her body -- like "internal pressure" L arm will tingle and get cold (this has happened before and was worked up for it in the past)  Parents healthy in their 32s ? If HTN in family  Medicine wise - she takes NEEM- in capsule form (similar to echinasia) - takes for bleeding gums    Lipids - on zocor  Lab Results  Component Value Date   CHOL 224* 09/28/2011   CHOL 205* 04/07/2010   CHOL 215* 09/26/2009   Lab Results  Component Value Date   HDL 45.90 09/28/2011   HDL 86.57 04/07/2010   HDL 84.69 09/26/2009   No results found for this basename: LDLCALC   Lab Results  Component Value Date   TRIG 233.0* 09/28/2011   TRIG 167.0* 04/07/2010   TRIG 170.0* 09/26/2009   Lab Results  Component Value Date   CHOLHDL 5 09/28/2011   CHOLHDL 5 04/07/2010   CHOLHDL 4 09/26/2009   Lab Results  Component Value Date   LDLDIRECT 145.2 09/28/2011   LDLDIRECT 132.7 04/07/2010   LDLDIRECT 138.1 09/26/2009     Diet- has been working on that and taking med regularly  Patient Active Problem List  Diagnosis  . NEOPLASM, SKIN, UNCERTAIN BEHAVIOR  . HYPERCHOLESTEROLEMIA  . ANXIETY  . ALLERGIC RHINITIS  . ASTHMA  . TMJ SYNDROME  . LOW BACK PAIN, CHRONIC  . PELVIC PAIN, CHRONIC  . MIGRAINES, HX OF  . Breast cancer screening  .  Essential hypertension   Past Medical History  Diagnosis Date  . Allergy   . Anxiety   . Asthma   . TMJ (dislocation of temporomandibular joint)   . Migraines   . Cancer     skin, basal cell  . Insomnia    Past Surgical History  Procedure Date  . Tubal ligation   . Skin lesion excision   . Hemorrhoid surgery 11/2003   History  Substance Use Topics  . Smoking status: Never Smoker   . Smokeless tobacco: Not on file  . Alcohol Use: Yes     1-2 weekly   Family History  Problem Relation Age of Onset  . Depression Mother   . Depression Father   . Hyperlipidemia Sister   . Cancer Sister     thyroid  . Hyperlipidemia Brother   . Cancer Maternal Grandmother     breast  . Heart attack Maternal Grandmother   . Nephrolithiasis Daughter   . Colon polyps Daughter   . Cancer Maternal Grandfather     stomach   No Known Allergies Current Outpatient Prescriptions on File Prior to Visit  Medication Sig Dispense Refill  . albuterol (PROVENTIL HFA;VENTOLIN HFA) 108 (90 BASE) MCG/ACT inhaler  Inhale 2 puffs into the lungs every 4 (four) hours as needed.      . sertraline (ZOLOFT) 50 MG tablet Take 1 tablet (50 mg total) by mouth daily.  90 tablet  3  . simvastatin (ZOCOR) 40 MG tablet Take 1 tablet (40 mg total) by mouth at bedtime.  90 tablet  3  . tiZANidine (ZANAFLEX) 4 MG tablet Take 4 mg by mouth at bedtime.      Marland Kitchen zolpidem (AMBIEN) 10 MG tablet TAKE ONE-HALF TABLET BY MOUTH AT BEDTIME AS NEEDED FOR SLEEP  45 tablet  0  . lisinopril (PRINIVIL,ZESTRIL) 10 MG tablet Take 1 tablet (10 mg total) by mouth daily.  30 tablet  3         Review of Systems Review of Systems  Constitutional: Negative for fever, appetite change, fatigue and unexpected weight change.  Eyes: Negative for pain and visual disturbance.  Respiratory: Negative for cough and shortness of breath.   Cardiovascular: Negative for cp or palpitations    Gastrointestinal: Negative for nausea, diarrhea and  constipation.  Genitourinary: Negative for urgency and frequency.  Skin: Negative for pallor or rash   Neurological: Negative for weakness, light-headedness, numbness and headaches.  Hematological: Negative for adenopathy. Does not bruise/bleed easily.  Psychiatric/Behavioral: Negative for dysphoric mood. The patient is not nervous/anxious.         Objective:   Physical Exam  Constitutional: She appears well-developed and well-nourished. No distress.  HENT:  Head: Normocephalic and atraumatic.  Mouth/Throat: Oropharynx is clear and moist.  Eyes: Conjunctivae and EOM are normal. Pupils are equal, round, and reactive to light. No scleral icterus.  Neck: Normal range of motion. Neck supple. No JVD present. Carotid bruit is not present. No thyromegaly present.  Cardiovascular: Normal rate, regular rhythm, normal heart sounds and intact distal pulses.  Exam reveals no gallop.   No murmur heard. Pulmonary/Chest: Effort normal and breath sounds normal. No respiratory distress. She has no wheezes. She has no rales.  Abdominal: Bowel sounds are normal. She exhibits no distension, no abdominal bruit and no mass. There is no tenderness.  Musculoskeletal: She exhibits no edema and no tenderness.  Lymphadenopathy:    She has no cervical adenopathy.  Neurological: She is alert. She has normal reflexes. No cranial nerve deficit. She exhibits normal muscle tone. Coordination normal.  Skin: Skin is warm and dry. No rash noted. No erythema. No pallor.  Psychiatric: She has a normal mood and affect.          Assessment & Plan:

## 2012-02-10 NOTE — Patient Instructions (Signed)
Eat less sodium Drink more water  Exercise regularly  Labs today Start lisinopril 10 mg -if side effects stop it and call  OMRON regular cuff for the arm -- is the brand I like  Follow up in 2-4 weeks

## 2012-02-10 NOTE — Assessment & Plan Note (Signed)
Labs today  More compliant with her meds Rev low sat fat diet

## 2012-02-10 NOTE — Assessment & Plan Note (Signed)
New essential HTN -no signs of 2ndary source but will check labs today Start lisinopril 10 mg - disc poss side eff incl hypotension-will update Disc lifestyle change in detail  F/u 2-4 weeks

## 2012-02-14 ENCOUNTER — Encounter: Payer: Self-pay | Admitting: Family Medicine

## 2012-02-17 ENCOUNTER — Encounter (HOSPITAL_BASED_OUTPATIENT_CLINIC_OR_DEPARTMENT_OTHER): Payer: Self-pay | Admitting: *Deleted

## 2012-02-17 ENCOUNTER — Emergency Department (HOSPITAL_BASED_OUTPATIENT_CLINIC_OR_DEPARTMENT_OTHER)
Admission: EM | Admit: 2012-02-17 | Discharge: 2012-02-18 | Disposition: A | Discharge status: Home / Self Care | Payer: BC Managed Care – PPO | Attending: Emergency Medicine | Admitting: Emergency Medicine

## 2012-02-17 DIAGNOSIS — N12 Tubulo-interstitial nephritis, not specified as acute or chronic: Secondary | ICD-10-CM | POA: Insufficient documentation

## 2012-02-17 DIAGNOSIS — J45909 Unspecified asthma, uncomplicated: Secondary | ICD-10-CM | POA: Insufficient documentation

## 2012-02-17 DIAGNOSIS — F411 Generalized anxiety disorder: Secondary | ICD-10-CM | POA: Insufficient documentation

## 2012-02-17 DIAGNOSIS — Z79899 Other long term (current) drug therapy: Secondary | ICD-10-CM | POA: Insufficient documentation

## 2012-02-17 DIAGNOSIS — I10 Essential (primary) hypertension: Secondary | ICD-10-CM | POA: Insufficient documentation

## 2012-02-17 LAB — URINALYSIS, ROUTINE W REFLEX MICROSCOPIC
Bilirubin Urine: NEGATIVE
Glucose, UA: NEGATIVE mg/dL
Hgb urine dipstick: NEGATIVE
Specific Gravity, Urine: 1.015 (ref 1.005–1.030)

## 2012-02-17 LAB — URINE MICROSCOPIC-ADD ON

## 2012-02-17 NOTE — ED Notes (Signed)
C/o right flank pain since this AM with nausea. Pt states that pain comes and goes, denies any urinary sxs.

## 2012-02-18 ENCOUNTER — Emergency Department (HOSPITAL_BASED_OUTPATIENT_CLINIC_OR_DEPARTMENT_OTHER): Payer: BC Managed Care – PPO

## 2012-02-18 MED ORDER — HYDROCODONE-ACETAMINOPHEN 5-325 MG PO TABS: 1.0000 | ORAL_TABLET | Freq: Four times a day (QID) | ORAL | Status: AC | PRN

## 2012-02-18 MED ORDER — KETOROLAC TROMETHAMINE 60 MG/2ML IM SOLN
60.0000 mg | Freq: Once | INTRAMUSCULAR | Status: AC
  Administered 2012-02-18: 60 mg via INTRAMUSCULAR
  Filled 2012-02-18: qty 2

## 2012-02-18 MED ORDER — CIPROFLOXACIN HCL 500 MG PO TABS: 500.0000 mg | ORAL_TABLET | Freq: Two times a day (BID) | ORAL | Status: AC

## 2012-02-18 MED ORDER — CIPROFLOXACIN HCL 500 MG PO TABS
500.0000 mg | ORAL_TABLET | Freq: Once | ORAL | Status: AC
  Administered 2012-02-18: 500 mg via ORAL
  Filled 2012-02-18: qty 1

## 2012-02-18 NOTE — ED Provider Notes (Signed)
History     CSN: [id number]  Arrival date & time 02/17/12  2255   First MD Initiated Contact with Patient 02/18/12 0100      Chief Complaint  Patient presents with  . Flank Pain    (Consider location/radiation/quality/duration/timing/severity/associated sxs/prior treatment) HPI This is a 61 year old white female with no history of renal stones. She complains of pain in the right flank it began yesterday morning. The pain has been intermittent. It is moderate to severe at its worst. There does not appear to be any exacerbating or mitigating factors. She's had no nausea, vomiting, diarrhea, fever, chills, dysuria, hematuria, vaginal discharge or vaginal bleeding. She does have a family history of nephrolithiasis.  Past Medical History  Diagnosis Date  . Allergy   . Anxiety   . Asthma   . TMJ (dislocation of temporomandibular joint)   . Migraines   . Cancer     skin, basal cell  . Insomnia   . Essential hypertension     Past Surgical History  Procedure Date  . Tubal ligation   . Skin lesion excision   . Hemorrhoid surgery 11/2003    Family History  Problem Relation Age of Onset  . Depression Mother   . Depression Father   . Hyperlipidemia Sister   . Cancer Sister     thyroid  . Hyperlipidemia Brother   . Cancer Maternal Grandmother     breast  . Heart attack Maternal Grandmother   . Nephrolithiasis Daughter   . Colon polyps Daughter   . Cancer Maternal Grandfather     stomach    History  Substance Use Topics  . Smoking status: Never Smoker   . Smokeless tobacco: Not on file  . Alcohol Use: Yes     1-2 weekly    OB History    Grav Para Term Preterm Abortions TAB SAB Ect Mult Living                  Review of Systems  All other systems reviewed and are negative.    Allergies  Review of patient's allergies indicates no known allergies.  Home Medications   Current Outpatient Rx  Name Route Sig Dispense Refill  . ALBUTEROL SULFATE HFA 108 (90  BASE) MCG/ACT IN AERS Inhalation Inhale 2 puffs into the lungs every 4 (four) hours as needed.    Marland Kitchen LISINOPRIL 10 MG PO TABS Oral Take 1 tablet (10 mg total) by mouth daily. 30 tablet 3  . SERTRALINE HCL 50 MG PO TABS Oral Take 1 tablet (50 mg total) by mouth daily. 90 tablet 3  . SIMVASTATIN 40 MG PO TABS Oral Take 1 tablet (40 mg total) by mouth at bedtime. 90 tablet 3  . ZOLPIDEM TARTRATE 10 MG PO TABS  TAKE ONE-HALF TABLET BY MOUTH AT BEDTIME AS NEEDED FOR SLEEP 45 tablet 0  . TIZANIDINE HCL 4 MG PO TABS Oral Take 4 mg by mouth at bedtime.      BP 155/93  Pulse 85  Temp 98.8 F (37.1 C) (Oral)  Resp 22  Ht 5\' 4"  (1.626 m)  Wt 144 lb (65.318 kg)  BMI 24.72 kg/m2  SpO2 100%  Physical Exam General: Well-developed, well-nourished female in no acute distress; appearance consistent with age of record HENT: normocephalic, atraumatic Eyes: pupils equal round and reactive to light; extraocular muscles intact Neck: supple Heart: regular rate and rhythm Lungs: clear to auscultation bilaterally Abdomen: soft; nondistended; nontender; no masses or hepatosplenomegaly; bowel sounds present GU:  Mild right flank tenderness Extremities: No deformity; full range of motion Neurologic: Awake, alert and oriented; motor function intact in all extremities and symmetric; no facial droop Skin: Warm and dry     ED Course  Procedures (including critical care time)     MDM   Nursing notes and vitals signs, including pulse oximetry, reviewed.  Summary of this visit's results, reviewed by myself:  Labs:  Results for orders placed during the hospital encounter of 02/17/12  URINALYSIS, ROUTINE W REFLEX MICROSCOPIC      Component Value Range   Color, Urine YELLOW  YELLOW   APPearance CLOUDY (*) CLEAR   Specific Gravity, Urine 1.015  1.005 - 1.030   pH 8.5 (*) 5.0 - 8.0   Glucose, UA NEGATIVE  NEGATIVE mg/dL   Hgb urine dipstick NEGATIVE  NEGATIVE   Bilirubin Urine NEGATIVE  NEGATIVE    Ketones, ur NEGATIVE  NEGATIVE mg/dL   Protein, ur NEGATIVE  NEGATIVE mg/dL   Urobilinogen, UA 0.2  0.0 - 1.0 mg/dL   Nitrite NEGATIVE  NEGATIVE   Leukocytes, UA MODERATE (*) NEGATIVE  URINE MICROSCOPIC-ADD ON      Component Value Range   Squamous Epithelial / LPF FEW (*) RARE   WBC, UA 7-10  <3 WBC/hpf   RBC / HPF 0-2  <3 RBC/hpf   Bacteria, UA MANY (*) RARE    Imaging Studies: Ct Abdomen Pelvis Wo Contrast  02/18/2012  *RADIOLOGY REPORT*  Clinical Data: Right flank pain.  Renal stone.  Bilateral flank pain.  CT ABDOMEN AND PELVIS WITHOUT CONTRAST  Technique:  Multidetector CT imaging of the abdomen and pelvis was performed following the standard protocol without intravenous contrast.  Comparison: None.  Findings: Lung bases clear.  Small hiatal hernia. Unenhanced CT was performed per clinician order.  Lack of IV contrast limits sensitivity and specificity, especially for evaluation of abdominal/pelvic solid viscera.  Liver, spleen, gallbladder, pancreas, common bile duct and stomach grossly appear within normal limits.  Small bowel mesentery appears normal.  Prominent stool burden is present in the cecum.  Normal appendix.  Physiologic appearance of the uterus and adnexa.  Urinary bladder appears normal.  Fecalization of small bowel in the anatomic pelvis suggests stasis.  Redundant sigmoid colon.  No colonic inflammatory changes or obstruction.  No adenopathy.  The left kidney shows interpolar and lower polar nonobstructing calculi.  Largest stone is in the interpolar region measuring 4 mm.  The right kidney is normal.  Both ureters appear within normal limits.  Mild abdominal aortic atherosclerosis.  No aggressive osseous lesions.  IMPRESSION: No acute abnormality.  Nonobstructing left renal collecting system calculi.  Original Report Authenticated By: Andreas Newport, M.D.   1:55 AM We will treat for pyelonephritis given patient's flank pain.        Hanley Seamen, MD 02/18/12 [phone]

## 2012-02-19 LAB — URINE CULTURE

## 2012-02-23 ENCOUNTER — Other Ambulatory Visit: Payer: Self-pay | Admitting: Family Medicine

## 2012-02-24 ENCOUNTER — Other Ambulatory Visit: Payer: Self-pay

## 2012-02-24 MED ORDER — ZOLPIDEM TARTRATE 10 MG PO TABS: 5.0000 mg | ORAL_TABLET | Freq: Every evening | ORAL | Status: DC | PRN

## 2012-02-24 NOTE — Telephone Encounter (Signed)
Px written for call in   

## 2012-02-24 NOTE — Telephone Encounter (Signed)
Last OV was 02/10/12. Ok to refill?

## 2012-02-25 NOTE — Telephone Encounter (Signed)
Rx called in as directed.   

## 2012-03-02 ENCOUNTER — Encounter: Payer: Self-pay | Admitting: Family Medicine

## 2012-03-02 ENCOUNTER — Ambulatory Visit (INDEPENDENT_AMBULATORY_CARE_PROVIDER_SITE_OTHER): Payer: BC Managed Care – PPO | Admitting: Family Medicine

## 2012-03-02 VITALS — BP 130/72 | HR 69 | Temp 98.1°F | Ht 64.0 in | Wt 143.2 lb

## 2012-03-02 DIAGNOSIS — N200 Calculus of kidney: Secondary | ICD-10-CM

## 2012-03-02 DIAGNOSIS — E7800 Pure hypercholesterolemia, unspecified: Secondary | ICD-10-CM

## 2012-03-02 DIAGNOSIS — I10 Essential (primary) hypertension: Secondary | ICD-10-CM

## 2012-03-02 HISTORY — DX: Calculus of kidney: N20.0

## 2012-03-02 NOTE — Patient Instructions (Addendum)
Stay on current medicine Blood pressure is better  Keep up the good health habits - cholesterol is good too  Keep hydrated for kidney stones  See you in December for visit

## 2012-03-02 NOTE — Assessment & Plan Note (Signed)
Passed one on R - ER visit- doing well now  Has some in L kidney  Also fam hx Disc imp of hydration If more problems will ref to urol - for now, obs

## 2012-03-02 NOTE — Progress Notes (Signed)
Subjective:    Patient ID: Taylor Ewing, female    DOB: 04-21-1951, 61 y.o.   MRN: [id number]  HPI Here for f/u of essential HTN dx at last visit  Started lisinopril bp is stable today  No cp or palpitations or headaches or edema  No side effects to medicines -- likes it and no side effects  BP Readings from Last 3 Encounters:  03/02/12 130/72  02/17/12 155/93  02/10/12 154/90    At home- bp are better  Better today  Does feel a bit better with lower bp   Had kidney stones and infection 2 wk ago - went to ER Was treated  She passed stone on the R side  Some in L kidney Does not see a urologist  She is trying to stay much more hydrated  (her father and daughter has them)     Chemistry      Component Value Date/Time   NA 140 02/10/2012 1253   K 4.4 02/10/2012 1253   CL 104 02/10/2012 1253   CO2 29 02/10/2012 1253   BUN 16 02/10/2012 1253   CREATININE 0.7 02/10/2012 1253      Component Value Date/Time   CALCIUM 9.3 02/10/2012 1253   ALKPHOS 83 02/10/2012 1253   AST 35 02/10/2012 1253   ALT 41* 02/10/2012 1253   BILITOT 0.7 02/10/2012 1253       Has hyperlipidemia  On statin and diet Lab Results  Component Value Date   CHOL 179 02/10/2012   CHOL 224* 09/28/2011   CHOL 205* 04/07/2010   Lab Results  Component Value Date   HDL 49.90 02/10/2012   HDL 45.90 09/28/2011   HDL 30.86 04/07/2010   No results found for this basename: LDLCALC   Lab Results  Component Value Date   TRIG 208.0* 02/10/2012   TRIG 233.0* 09/28/2011   TRIG 167.0* 04/07/2010   Lab Results  Component Value Date   CHOLHDL 4 02/10/2012   CHOLHDL 5 09/28/2011   CHOLHDL 5 04/07/2010   Lab Results  Component Value Date   LDLDIRECT 103.2 02/10/2012   LDLDIRECT 145.2 09/28/2011   LDLDIRECT 132.7 04/07/2010    Taking the simvastatin   Patient Active Problem List  Diagnosis  . NEOPLASM, SKIN, UNCERTAIN BEHAVIOR  . HYPERCHOLESTEROLEMIA  . ANXIETY  . ALLERGIC RHINITIS  . ASTHMA  . TMJ SYNDROME  . LOW BACK PAIN,  CHRONIC  . PELVIC PAIN, CHRONIC  . MIGRAINES, HX OF  . Breast cancer screening  . Essential hypertension   Past Medical History  Diagnosis Date  . Allergy   . Anxiety   . Asthma   . TMJ (dislocation of temporomandibular joint)   . Migraines   . Cancer     skin, basal cell  . Insomnia   . Essential hypertension    Past Surgical History  Procedure Date  . Tubal ligation   . Skin lesion excision   . Hemorrhoid surgery 11/2003   History  Substance Use Topics  . Smoking status: Never Smoker   . Smokeless tobacco: Not on file  . Alcohol Use: Yes     1-2 weekly   Family History  Problem Relation Age of Onset  . Depression Mother   . Depression Father   . Hyperlipidemia Sister   . Cancer Sister     thyroid  . Hyperlipidemia Brother   . Cancer Maternal Grandmother     breast  . Heart attack Maternal Grandmother   . Nephrolithiasis Daughter   .  Colon polyps Daughter   . Cancer Maternal Grandfather     stomach   No Known Allergies Current Outpatient Prescriptions on File Prior to Visit  Medication Sig Dispense Refill  . albuterol (PROVENTIL HFA;VENTOLIN HFA) 108 (90 BASE) MCG/ACT inhaler Inhale 2 puffs into the lungs every 4 (four) hours as needed.      Marland Kitchen lisinopril (PRINIVIL,ZESTRIL) 10 MG tablet Take 1 tablet (10 mg total) by mouth daily.  30 tablet  3  . sertraline (ZOLOFT) 50 MG tablet Take 1 tablet (50 mg total) by mouth daily.  90 tablet  3  . simvastatin (ZOCOR) 40 MG tablet Take 1 tablet (40 mg total) by mouth at bedtime.  90 tablet  3  . tiZANidine (ZANAFLEX) 4 MG tablet Take 4 mg by mouth at bedtime.      Marland Kitchen zolpidem (AMBIEN) 10 MG tablet Take 0.5 tablets (5 mg total) by mouth at bedtime as needed for sleep.  45 tablet  0     Review of Systems Review of Systems  Constitutional: Negative for fever, appetite change, fatigue and unexpected weight change.  Eyes: Negative for pain and visual disturbance.  Respiratory: Negative for cough and shortness of breath.    Cardiovascular: Negative for cp or palpitations    Gastrointestinal: Negative for nausea, diarrhea and constipation.  Genitourinary: Negative for urgency and frequency.  Skin: Negative for pallor or rash   Neurological: Negative for weakness, light-headedness, numbness and headaches.  Hematological: Negative for adenopathy. Does not bruise/bleed easily.  Psychiatric/Behavioral: Negative for dysphoric mood. The patient is not nervous/anxious.         Objective:   Physical Exam  Constitutional: She appears well-developed and well-nourished. No distress.  HENT:  Head: Normocephalic and atraumatic.  Mouth/Throat: Oropharynx is clear and moist.  Eyes: Conjunctivae and EOM are normal. Pupils are equal, round, and reactive to light. No scleral icterus.  Neck: Normal range of motion. Neck supple. No JVD present. Carotid bruit is not present. No thyromegaly present.  Cardiovascular: Normal rate, regular rhythm, normal heart sounds and intact distal pulses.  Exam reveals no gallop.   Pulmonary/Chest: Effort normal and breath sounds normal. No respiratory distress. She has no wheezes.  Abdominal: Soft. Bowel sounds are normal. She exhibits no distension, no abdominal bruit and no mass. There is no tenderness.  Musculoskeletal: She exhibits no edema.  Lymphadenopathy:    She has no cervical adenopathy.  Neurological: She is alert. She has normal reflexes. No cranial nerve deficit. She exhibits normal muscle tone. Coordination normal.  Skin: Skin is warm and dry. No rash noted. No erythema. No pallor.  Psychiatric: She has a normal mood and affect.          Assessment & Plan:

## 2012-03-02 NOTE — Assessment & Plan Note (Signed)
Lipids improved Commended better habits No problems with simvastatin Disc goals for lipids and reasons to control them Rev labs with pt Rev low sat fat diet in detail

## 2012-03-02 NOTE — Assessment & Plan Note (Signed)
Blood pressure is better here and at home Doing well with lisinopril  Good health habits bp in fair control at this time  No changes needed  Disc lifstyle change with low sodium diet and exercise

## 2012-05-22 ENCOUNTER — Other Ambulatory Visit: Payer: Self-pay | Admitting: Family Medicine

## 2012-05-23 NOTE — Telephone Encounter (Signed)
Px written for call in   

## 2012-05-23 NOTE — Telephone Encounter (Signed)
Rx called in as prescribed 

## 2012-05-23 NOTE — Telephone Encounter (Signed)
Ok to refill 

## 2012-06-07 ENCOUNTER — Other Ambulatory Visit: Payer: Self-pay

## 2012-06-15 ENCOUNTER — Encounter: Payer: Self-pay | Admitting: Family Medicine

## 2012-06-15 ENCOUNTER — Other Ambulatory Visit (HOSPITAL_COMMUNITY)
Admission: RE | Admit: 2012-06-15 | Discharge: 2012-06-15 | Disposition: A | Discharge status: Home / Self Care | Payer: BC Managed Care – PPO | Source: Ambulatory Visit | Attending: Family Medicine | Admitting: Family Medicine

## 2012-06-15 ENCOUNTER — Ambulatory Visit (INDEPENDENT_AMBULATORY_CARE_PROVIDER_SITE_OTHER): Payer: BC Managed Care – PPO | Admitting: Family Medicine

## 2012-06-15 VITALS — BP 130/80 | HR 80 | Temp 97.8°F | Ht 64.25 in | Wt 143.2 lb

## 2012-06-15 DIAGNOSIS — Z1151 Encounter for screening for human papillomavirus (HPV): Secondary | ICD-10-CM | POA: Insufficient documentation

## 2012-06-15 DIAGNOSIS — Z23 Encounter for immunization: Secondary | ICD-10-CM

## 2012-06-15 DIAGNOSIS — Z01419 Encounter for gynecological examination (general) (routine) without abnormal findings: Secondary | ICD-10-CM | POA: Insufficient documentation

## 2012-06-15 DIAGNOSIS — E7800 Pure hypercholesterolemia, unspecified: Secondary | ICD-10-CM

## 2012-06-15 DIAGNOSIS — Z0000 Encounter for general adult medical examination without abnormal findings: Secondary | ICD-10-CM | POA: Insufficient documentation

## 2012-06-15 DIAGNOSIS — I10 Essential (primary) hypertension: Secondary | ICD-10-CM

## 2012-06-15 MED ORDER — LISINOPRIL 10 MG PO TABS: 10.0000 mg | ORAL_TABLET | Freq: Every day | ORAL | Status: DC

## 2012-06-15 NOTE — Assessment & Plan Note (Signed)
Reviewed health habits including diet and exercise and skin cancer prevention Also reviewed health mt list, fam hx and immunizations  Flu vaccine today Chol/  cmet today- other labs ok in aug

## 2012-06-15 NOTE — Assessment & Plan Note (Signed)
bp in fair control at this time  No changes needed  Disc lifstyle change with low sodium diet and exercise  Was better on 2nd check after sitting

## 2012-06-15 NOTE — Assessment & Plan Note (Signed)
Re check today zocor and diet Rev low satfat diet

## 2012-06-15 NOTE — Progress Notes (Signed)
Subjective:    Patient ID: Taylor Ewing, female    DOB: 11/21/1950, 61 y.o.   MRN: [id number]  HPI Here for health maintenance exam and to review chronic medical problems    Has been feeling pretty good overall  Had a death in the family this week - has been a hard week  Is dealing with it ok   Pap 2/10 Coming up on 3 years for pap No gyn issues or problems   Declines flu vaccine in past-will get this year    mammo 4/13- at [facility]  Self exam-no lumps or changes   colonosc 3/11- daughter had colon polyps  ? 5 year recall    bp is up today on first check Not high outside the office  No cp or palpitations or headaches or edema  No side effects to medicines  BP Readings from Last 3 Encounters:  06/15/12 162/84  03/02/12 130/72  02/17/12 155/93     Wt is stable with bmi of 24  Lipid Lab Results  Component Value Date   CHOL 179 02/10/2012   CHOL 224* 09/28/2011   CHOL 205* 04/07/2010   Lab Results  Component Value Date   HDL 49.90 02/10/2012   HDL 45.90 09/28/2011   HDL 40.98 04/07/2010   No results found for this basename: LDLCALC   Lab Results  Component Value Date   TRIG 208.0* 02/10/2012   TRIG 233.0* 09/28/2011   TRIG 167.0* 04/07/2010   Lab Results  Component Value Date   CHOLHDL 4 02/10/2012   CHOLHDL 5 09/28/2011   CHOLHDL 5 04/07/2010   Lab Results  Component Value Date   LDLDIRECT 103.2 02/10/2012   LDLDIRECT 145.2 09/28/2011   LDLDIRECT 132.7 04/07/2010   is on zocor and diet  Due for labs  No missed zocor doses or lisinopril doses  Some exercise- likes to walk - not as much lately weather wise   Mood is ok  Not down  Not lacking motivation   Patient Active Problem List  Diagnosis  . NEOPLASM, SKIN, UNCERTAIN BEHAVIOR  . HYPERCHOLESTEROLEMIA  . ANXIETY  . ALLERGIC RHINITIS  . ASTHMA  . TMJ SYNDROME  . LOW BACK PAIN, CHRONIC  . PELVIC PAIN, CHRONIC  . MIGRAINES, HX OF  . Breast cancer screening  . Essential hypertension  . Kidney stones   . Routine general medical examination at a health care facility   Past Medical History  Diagnosis Date  . Allergy   . Anxiety   . Asthma   . TMJ (dislocation of temporomandibular joint)   . Migraines   . Cancer     skin, basal cell  . Insomnia   . Essential hypertension   . Kidney stones 03/02/2012   Past Surgical History  Procedure Date  . Tubal ligation   . Skin lesion excision   . Hemorrhoid surgery 11/2003   History  Substance Use Topics  . Smoking status: Never Smoker   . Smokeless tobacco: Not on file  . Alcohol Use: Yes     Comment: 1-2 weekly   Family History  Problem Relation Age of Onset  . Depression Mother   . Depression Father   . Hyperlipidemia Sister   . Cancer Sister     thyroid  . Hyperlipidemia Brother   . Cancer Maternal Grandmother     breast  . Heart attack Maternal Grandmother   . Nephrolithiasis Daughter   . Colon polyps Daughter   . Cancer Maternal Grandfather  stomach   No Known Allergies Current Outpatient Prescriptions on File Prior to Visit  Medication Sig Dispense Refill  . albuterol (PROVENTIL HFA;VENTOLIN HFA) 108 (90 BASE) MCG/ACT inhaler Inhale 2 puffs into the lungs every 4 (four) hours as needed.      Marland Kitchen lisinopril (PRINIVIL,ZESTRIL) 10 MG tablet Take 1 tablet (10 mg total) by mouth daily.  30 tablet  3  . sertraline (ZOLOFT) 50 MG tablet Take 1 tablet (50 mg total) by mouth daily.  90 tablet  3  . simvastatin (ZOCOR) 40 MG tablet Take 1 tablet (40 mg total) by mouth at bedtime.  90 tablet  3  . tiZANidine (ZANAFLEX) 4 MG tablet Take 4 mg by mouth as needed.       . zolpidem (AMBIEN) 10 MG tablet TAKE ONE-HALF TABLET BY MOUTH AT BEDTIME AS NEEDED FOR SLEEP.  45 tablet  0     Review of Systems Review of Systems  Constitutional: Negative for fever, appetite change, fatigue and unexpected weight change.  Eyes: Negative for pain and visual disturbance.  Respiratory: Negative for cough and shortness of breath.    Cardiovascular: Negative for cp or palpitations    Gastrointestinal: Negative for nausea, diarrhea and constipation.  Genitourinary: Negative for urgency and frequency.  Skin: Negative for pallor or rash   Neurological: Negative for weakness, light-headedness, numbness and headaches.  Hematological: Negative for adenopathy. Does not bruise/bleed easily.  Psychiatric/Behavioral: Negative for dysphoric mood. The patient is not nervous/anxious.         Objective:   Physical Exam  Constitutional: She appears well-developed and well-nourished. No distress.  HENT:  Head: Normocephalic and atraumatic.  Right Ear: External ear normal.  Left Ear: External ear normal.  Nose: Nose normal.  Mouth/Throat: Oropharynx is clear and moist.  Eyes: Conjunctivae normal and EOM are normal. Pupils are equal, round, and reactive to light. Right eye exhibits no discharge. Left eye exhibits no discharge.  Neck: Normal range of motion. Neck supple. No JVD present. Carotid bruit is not present. No thyromegaly present.  Cardiovascular: Normal rate, regular rhythm, normal heart sounds and intact distal pulses.  Exam reveals no gallop.   Pulmonary/Chest: Effort normal and breath sounds normal. No respiratory distress. She has no wheezes. She has no rales.  Abdominal: Soft. Bowel sounds are normal. She exhibits no distension, no abdominal bruit and no mass. There is no tenderness.  Genitourinary: Vagina normal and uterus normal. No breast swelling, tenderness, discharge or bleeding. There is no rash or tenderness on the right labia. There is no rash or tenderness on the left labia. Uterus is not enlarged and not tender. Cervix exhibits no motion tenderness, no discharge and no friability. Right adnexum displays no mass, no tenderness and no fullness. Left adnexum displays no mass, no tenderness and no fullness. No bleeding around the vagina. No vaginal discharge found.       Breast exam: No mass, nodules, thickening,  tenderness, bulging, retraction, inflamation, nipple discharge or skin changes noted.  No axillary or clavicular LA.  Chaperoned exam.    Musculoskeletal: Normal range of motion. She exhibits no edema and no tenderness.  Lymphadenopathy:    She has no cervical adenopathy.  Neurological: She is alert. She has normal reflexes. No cranial nerve deficit. She exhibits normal muscle tone. Coordination normal.  Skin: Skin is warm and dry. No rash noted. No erythema. No pallor.  Psychiatric: She has a normal mood and affect.          Assessment &  Plan:

## 2012-06-15 NOTE — Patient Instructions (Addendum)
Flu vaccine today  Lab today  Pap smear today

## 2012-06-15 NOTE — Assessment & Plan Note (Signed)
3 year pap and exam today-no problems

## 2012-06-16 LAB — LIPID PANEL
Cholesterol: 181 mg/dL (ref 0–200)
HDL: 46.3 mg/dL (ref >39.00)
Triglycerides: 318 mg/dL — ABNORMAL HIGH (ref 0.0–149.0)
VLDL: 63.6 mg/dL — ABNORMAL HIGH (ref 0.0–40.0)

## 2012-06-16 LAB — COMPREHENSIVE METABOLIC PANEL
AST: 34 U/L (ref 0–37)
BUN: 17 mg/dL (ref 6–23)
Calcium: 10 mg/dL (ref 8.4–10.5)
Chloride: 103 mEq/L (ref 96–112)
Creatinine, Ser: 0.9 mg/dL (ref 0.4–1.2)
Total Bilirubin: 0.5 mg/dL (ref 0.3–1.2)

## 2012-06-20 ENCOUNTER — Encounter: Payer: Self-pay | Admitting: *Deleted

## 2012-06-22 ENCOUNTER — Encounter: Payer: Self-pay | Admitting: *Deleted

## 2012-06-23 ENCOUNTER — Telehealth: Payer: Self-pay

## 2012-06-23 NOTE — Telephone Encounter (Signed)
Pt left v/m that she did receive lab results; pt concerned triglycerides were elevated; pt said she did not fast for test. Pt also wondered why retested blood work in 4 months due to being charged for same blood test so close together.Please advise.

## 2012-06-23 NOTE — Telephone Encounter (Signed)
Patient notified as instructed by telephone. 

## 2012-06-23 NOTE — Telephone Encounter (Signed)
The not fasting would explain increased triglycerides - next time we will make sure to check fasting  The labs were done automatically because she had a physical - will make sure not to do that again  Also - 2 draws ago chol was up when not taking zocor ever single day- and I wanted to make sure the numbers were stable- from chol and liver- after that many days on a stable dose

## 2012-06-23 NOTE — Telephone Encounter (Signed)
Left voicemail requesting pt to call office, will try to call back later 

## 2012-08-22 ENCOUNTER — Other Ambulatory Visit: Payer: Self-pay | Admitting: Family Medicine

## 2012-08-22 NOTE — Telephone Encounter (Signed)
Rx called in as prescribed 

## 2012-08-22 NOTE — Telephone Encounter (Signed)
Px written for call in   

## 2012-08-22 NOTE — Telephone Encounter (Signed)
Ok to refill 

## 2012-10-05 ENCOUNTER — Other Ambulatory Visit: Payer: Self-pay | Admitting: Family Medicine

## 2012-10-05 NOTE — Telephone Encounter (Signed)
Pt left v/m has been awhile since used [employer] inhaler; developed tightness in chest with some trouble breathing, non productive cough and no fever. Pt request refill of proair inhaler.Please advise.

## 2012-10-05 NOTE — Telephone Encounter (Signed)
Rx sent to pharmacy and pt advise to f/u if no improvement

## 2012-10-05 NOTE — Telephone Encounter (Signed)
She can have 3 refils - and ask her to follow up if not improving

## 2012-10-20 ENCOUNTER — Encounter: Payer: Self-pay | Admitting: *Deleted

## 2012-10-20 ENCOUNTER — Encounter: Payer: Self-pay | Admitting: Family Medicine

## 2012-11-08 ENCOUNTER — Other Ambulatory Visit: Payer: Self-pay | Admitting: Family Medicine

## 2012-12-25 ENCOUNTER — Other Ambulatory Visit: Payer: Self-pay | Admitting: Family Medicine

## 2012-12-26 NOTE — Telephone Encounter (Signed)
Electronic refill request, Dr. Tower not in office, please advise  

## 2013-02-14 ENCOUNTER — Other Ambulatory Visit: Payer: Self-pay | Admitting: Family Medicine

## 2013-02-15 NOTE — Telephone Encounter (Signed)
Rx called in as prescribed 

## 2013-02-15 NOTE — Telephone Encounter (Signed)
Px written for call in   

## 2013-02-15 NOTE — Telephone Encounter (Signed)
Electronic refill request, please advise  

## 2013-03-09 ENCOUNTER — Other Ambulatory Visit: Payer: Self-pay | Admitting: Dermatology

## 2013-05-14 ENCOUNTER — Other Ambulatory Visit: Payer: Self-pay | Admitting: Family Medicine

## 2013-05-15 NOTE — Telephone Encounter (Signed)
Please schedule a winter PE and refill until then, thanks

## 2013-05-15 NOTE — Telephone Encounter (Signed)
Electronic refill request, no recent/future appt., please advise  

## 2013-05-16 NOTE — Telephone Encounter (Signed)
CPE scheduled and med refilled until then 

## 2013-05-24 ENCOUNTER — Other Ambulatory Visit: Payer: Self-pay | Admitting: Family Medicine

## 2013-05-24 MED ORDER — ZOLPIDEM TARTRATE 10 MG PO TABS: ORAL_TABLET | ORAL | Status: DC

## 2013-05-24 NOTE — Telephone Encounter (Signed)
Px written for call in   

## 2013-05-24 NOTE — Telephone Encounter (Signed)
Fax refill request, please advise  

## 2013-05-25 NOTE — Telephone Encounter (Signed)
Rx called in as prescribed 

## 2013-06-19 ENCOUNTER — Encounter: Payer: Self-pay | Admitting: Radiology

## 2013-06-20 ENCOUNTER — Encounter: Payer: Self-pay | Admitting: Family Medicine

## 2013-06-20 ENCOUNTER — Ambulatory Visit (INDEPENDENT_AMBULATORY_CARE_PROVIDER_SITE_OTHER): Payer: BC Managed Care – PPO | Admitting: Family Medicine

## 2013-06-20 VITALS — BP 128/82 | HR 75 | Temp 98.1°F | Ht 64.5 in | Wt 142.8 lb

## 2013-06-20 DIAGNOSIS — Z0000 Encounter for general adult medical examination without abnormal findings: Secondary | ICD-10-CM

## 2013-06-20 DIAGNOSIS — E7800 Pure hypercholesterolemia, unspecified: Secondary | ICD-10-CM

## 2013-06-20 DIAGNOSIS — I10 Essential (primary) hypertension: Secondary | ICD-10-CM

## 2013-06-20 MED ORDER — LISINOPRIL 10 MG PO TABS: 10.0000 mg | ORAL_TABLET | Freq: Every day | ORAL | Status: DC

## 2013-06-20 MED ORDER — SERTRALINE HCL 50 MG PO TABS: 50.0000 mg | ORAL_TABLET | Freq: Every day | ORAL | Status: DC

## 2013-06-20 MED ORDER — SIMVASTATIN 40 MG PO TABS: 40.0000 mg | ORAL_TABLET | Freq: Every day | ORAL | Status: DC

## 2013-06-20 NOTE — Progress Notes (Signed)
Pre-visit discussion using our clinic review tool. No additional management support is needed unless otherwise documented below in the visit note.  

## 2013-06-20 NOTE — Patient Instructions (Addendum)
If you are interested in a shingles/zoster vaccine - call your insurance to check on coverage,( you should not get it within 1 month of other vaccines) , then call us for a prescription  for it to take to a pharmacy that gives the shot , or make a nurse visit to get it here depending on your coverage Get your annual flu shot at a pharmacy or the health department  Labs today Take care of yourself  Make your own mammogram appt. For April

## 2013-06-20 NOTE — Progress Notes (Signed)
   Subjective:    Patient ID: Taylor Ewing, female    DOB: [birth year], 62 y.o.   MRN: [id number]  HPI Here for health maintenance exam and to review chronic medical problems    Nothing new going on   Wt is down 1 lb with bmi of 24  Does well with wt mt - she tries to watch diet  Also walks for exercise   Needs to update controlled subst agreement   Zoster status-? About insurance coverage   Flu shot- will get that at a pharmacy- will be less expensive   Td 1/05  Mammogram 4/14 Self exam- no lumps or changes   Pap 12/13 nl  Does not see gyn  No abn paps or gyn problems   colonosc 3/11 -she thinks she had a 10 year recall  Needs lab today- for cholesterol and wellness   Mood -has been ok / has ups and downs but stays motivated   She still takes [place] for sleep -and it works well - takes 1/2 of a 10 mg - cannot sleep without it     Chemistry      Component Value Date/Time   NA 139 06/15/2012 1512   K 4.1 06/15/2012 1512   CL 103 06/15/2012 1512   CO2 28 06/15/2012 1512   BUN 17 06/15/2012 1512   CREATININE 0.9 06/15/2012 1512      Component Value Date/Time   CALCIUM 10.0 06/15/2012 1512   ALKPHOS 89 06/15/2012 1512   AST 34 06/15/2012 1512   ALT 35 06/15/2012 1512   BILITOT 0.5 06/15/2012 1512      Lab Results  Component Value Date   CHOL 181 06/15/2012   HDL 46.30 06/15/2012   LDLDIRECT 108.8 06/15/2012   TRIG 318.0* 06/15/2012   CHOLHDL 4 06/15/2012        Review of Systems     Objective:   Physical Exam  Constitutional: She appears well-developed and well-nourished. No distress.  HENT:  Head: Normocephalic and atraumatic.  Right Ear: External ear normal.  Left Ear: External ear normal.  Mouth/Throat: Oropharynx is clear and moist.  Eyes: Conjunctivae and EOM are normal. Pupils are equal, round, and reactive to light. No scleral icterus.  Neck: Normal range of motion. Neck supple. No JVD present. Carotid bruit is not present. No  thyromegaly present.  Cardiovascular: Normal rate, regular rhythm, normal heart sounds and intact distal pulses.  Exam reveals no gallop.   Pulmonary/Chest: Effort normal and breath sounds normal. No respiratory distress. She has no wheezes. She exhibits no tenderness.  Abdominal: Soft. Bowel sounds are normal. She exhibits no distension, no abdominal bruit and no mass. There is no tenderness.  Genitourinary: No breast swelling, tenderness, discharge or bleeding.  Breast exam: No mass, nodules, thickening, tenderness, bulging, retraction, inflamation, nipple discharge or skin changes noted.  No axillary or clavicular LA.    Musculoskeletal: Normal range of motion. She exhibits no edema and no tenderness.  Lymphadenopathy:    She has no cervical adenopathy.  Neurological: She is alert. She has normal reflexes. No cranial nerve deficit. She exhibits normal muscle tone. Coordination normal.  Skin: Skin is warm and dry. No rash noted. No erythema. No pallor.  [place] diffusely   Psychiatric: She has a normal mood and affect.          Assessment & Plan:

## 2013-06-21 LAB — COMPREHENSIVE METABOLIC PANEL
Albumin: 5.1 g/dL (ref 3.5–5.2)
BUN: 18 mg/dL (ref 6–23)
Calcium: 9.9 mg/dL (ref 8.4–10.5)
Chloride: 105 mEq/L (ref 96–112)
GFR: 71.44 mL/min (ref >60.00)
Glucose, Bld: 83 mg/dL (ref 70–99)
Potassium: 4 mEq/L (ref 3.5–5.1)

## 2013-06-21 LAB — LIPID PANEL: Cholesterol: 192 mg/dL (ref 0–200)

## 2013-06-21 LAB — CBC WITH DIFFERENTIAL/PLATELET
Basophils Relative: 0.7 % (ref 0.0–3.0)
Eosinophils Relative: 1.4 % (ref 0.0–5.0)
Hemoglobin: 13.5 g/dL (ref 12.0–15.0)
Lymphocytes Relative: 26.6 % (ref 12.0–46.0)
MCV: 89.8 fl (ref 78.0–100.0)
Monocytes Absolute: 0.7 10*3/uL (ref 0.1–1.0)
Neutrophils Relative %: 61.2 % (ref 43.0–77.0)
RBC: 4.41 Mil/uL (ref 3.87–5.11)
WBC: 6.7 10*3/uL (ref 4.5–10.5)

## 2013-06-21 LAB — TSH: TSH: 0.82 u[IU]/mL (ref 0.35–5.50)

## 2013-06-21 NOTE — Assessment & Plan Note (Signed)
Reviewed health habits including diet and exercise and skin cancer prevention Reviewed appropriate screening tests for age  Also reviewed health mt list, fam hx and immunization status , as well as social and family history    See HPI Wellness lab today She will schedule her own mammogram

## 2013-06-21 NOTE — Assessment & Plan Note (Signed)
BP: 128/82 mmHg  bp in fair control at this time  No changes needed Disc lifstyle change with low sodium diet and exercise   Labs today

## 2013-06-21 NOTE — Assessment & Plan Note (Signed)
Disc goals for lipids and reasons to control them Rev labs with pt from last time- lipid today Rev low sat fat diet in detail

## 2013-06-23 ENCOUNTER — Encounter: Payer: Self-pay | Admitting: *Deleted

## 2013-07-19 ENCOUNTER — Encounter: Payer: Self-pay | Admitting: Family Medicine

## 2013-08-23 ENCOUNTER — Other Ambulatory Visit: Payer: Self-pay | Admitting: *Deleted

## 2013-08-23 MED ORDER — ZOLPIDEM TARTRATE 10 MG PO TABS: ORAL_TABLET | ORAL | Status: DC

## 2013-08-23 NOTE — Telephone Encounter (Signed)
Rx called in as prescribed 

## 2013-08-23 NOTE — Telephone Encounter (Signed)
Px written for call in   

## 2013-08-23 NOTE — Telephone Encounter (Signed)
Fax refill request, please advise  

## 2013-11-21 ENCOUNTER — Other Ambulatory Visit: Payer: Self-pay | Admitting: *Deleted

## 2013-11-21 MED ORDER — ZOLPIDEM TARTRATE 10 MG PO TABS: ORAL_TABLET | ORAL | Status: DC

## 2013-11-21 NOTE — Telephone Encounter (Signed)
Received faxed refill request from pharmacy. Last refill 08/23/13 #45, last office visit 06/20/13. Is it okay to refill medication?

## 2013-11-21 NOTE — Telephone Encounter (Signed)
Px written for call in   

## 2013-11-21 NOTE — Telephone Encounter (Signed)
Rx called in as prescribed 

## 2013-11-24 NOTE — Telephone Encounter (Signed)
Pt called rx not at [facility] [address]; spoke with kimberly at [facility] and she will transfer from [facility]. Updated pt' preferred pharmacy list to [facility]. Pt notified.

## 2014-01-15 ENCOUNTER — Encounter: Payer: Self-pay | Admitting: Family Medicine

## 2014-02-07 ENCOUNTER — Encounter: Payer: Self-pay | Admitting: Gastroenterology

## 2014-02-21 ENCOUNTER — Other Ambulatory Visit: Payer: Self-pay | Admitting: Family Medicine

## 2014-05-13 ENCOUNTER — Other Ambulatory Visit: Payer: Self-pay | Admitting: Family Medicine

## 2014-05-14 NOTE — Telephone Encounter (Signed)
Electronic refill request, please advise  

## 2014-05-14 NOTE — Telephone Encounter (Signed)
Rx called in as prescribed 

## 2014-05-14 NOTE — Telephone Encounter (Signed)
Px written for call in   

## 2014-06-24 ENCOUNTER — Telehealth: Payer: Self-pay | Admitting: Family Medicine

## 2014-06-25 NOTE — Telephone Encounter (Signed)
Please schedule annual exam and refill until then  

## 2014-06-25 NOTE — Telephone Encounter (Signed)
Electronic refill request, no recent/future appt., last appt was a CPE in 06/2014, please advise

## 2014-06-26 NOTE — Telephone Encounter (Signed)
LVM for pt to c/b and schedule cpe

## 2014-06-26 NOTE — Telephone Encounter (Signed)
Scheduled cpe 08/13/14.  Please refill and close

## 2014-06-26 NOTE — Telephone Encounter (Signed)
See prev note, please schedule CPE

## 2014-08-05 ENCOUNTER — Telehealth: Payer: Self-pay | Admitting: Family Medicine

## 2014-08-05 DIAGNOSIS — Z0000 Encounter for general adult medical examination without abnormal findings: Secondary | ICD-10-CM

## 2014-08-05 NOTE — Telephone Encounter (Signed)
-----   Message from Ellamae Sia sent at 08/01/2014  3:46 PM EST ----- Regarding: Lab orders for Monday, 2.1.16 Patient is scheduled for CPX labs, please order future labs, Thanks , Karna Christmas

## 2014-08-06 ENCOUNTER — Other Ambulatory Visit (INDEPENDENT_AMBULATORY_CARE_PROVIDER_SITE_OTHER): Payer: 59

## 2014-08-06 DIAGNOSIS — Z0000 Encounter for general adult medical examination without abnormal findings: Secondary | ICD-10-CM

## 2014-08-06 LAB — LIPID PANEL
CHOL/HDL RATIO: 4
Cholesterol: 158 mg/dL (ref 0–200)
HDL: 39.5 mg/dL (ref >39.00)
NonHDL: 118.5
Triglycerides: 209 mg/dL — ABNORMAL HIGH (ref 0.0–149.0)
VLDL: 41.8 mg/dL — AB (ref 0.0–40.0)

## 2014-08-06 LAB — LDL CHOLESTEROL, DIRECT: LDL DIRECT: 88 mg/dL

## 2014-08-06 LAB — CBC WITH DIFFERENTIAL/PLATELET
BASOS ABS: 0 10*3/uL (ref 0.0–0.1)
Basophils Relative: 0.5 % (ref 0.0–3.0)
EOS PCT: 2.4 % (ref 0.0–5.0)
Eosinophils Absolute: 0.1 10*3/uL (ref 0.0–0.7)
HEMATOCRIT: 40.7 % (ref 36.0–46.0)
Hemoglobin: 14 g/dL (ref 12.0–15.0)
LYMPHS ABS: 1.4 10*3/uL (ref 0.7–4.0)
Lymphocytes Relative: 24.9 % (ref 12.0–46.0)
MCHC: 34.4 g/dL (ref 30.0–36.0)
MCV: 90.1 fl (ref 78.0–100.0)
MONOS PCT: 11 % (ref 3.0–12.0)
Monocytes Absolute: 0.6 10*3/uL (ref 0.1–1.0)
NEUTROS PCT: 61.2 % (ref 43.0–77.0)
Neutro Abs: 3.5 10*3/uL (ref 1.4–7.7)
PLATELETS: 296 10*3/uL (ref 150.0–400.0)
RBC: 4.52 Mil/uL (ref 3.87–5.11)
RDW: 12.9 % (ref 11.5–15.5)
WBC: 5.7 10*3/uL (ref 4.0–10.5)

## 2014-08-06 LAB — COMPREHENSIVE METABOLIC PANEL
ALBUMIN: 4.7 g/dL (ref 3.5–5.2)
ALK PHOS: 89 U/L (ref 39–117)
ALT: 54 U/L — AB (ref 0–35)
AST: 41 U/L — ABNORMAL HIGH (ref 0–37)
BILIRUBIN TOTAL: 0.4 mg/dL (ref 0.2–1.2)
BUN: 13 mg/dL (ref 6–23)
CO2: 28 meq/L (ref 19–32)
Calcium: 9.5 mg/dL (ref 8.4–10.5)
Chloride: 105 mEq/L (ref 96–112)
Creatinine, Ser: 0.8 mg/dL (ref 0.40–1.20)
GFR: 77.37 mL/min (ref >60.00)
GLUCOSE: 112 mg/dL — AB (ref 70–99)
POTASSIUM: 4.4 meq/L (ref 3.5–5.1)
SODIUM: 141 meq/L (ref 135–145)
TOTAL PROTEIN: 7.3 g/dL (ref 6.0–8.3)

## 2014-08-06 LAB — TSH: TSH: 1.58 u[IU]/mL (ref 0.35–4.50)

## 2014-08-13 ENCOUNTER — Encounter: Payer: Self-pay | Admitting: Family Medicine

## 2014-08-13 ENCOUNTER — Ambulatory Visit (INDEPENDENT_AMBULATORY_CARE_PROVIDER_SITE_OTHER): Payer: 59 | Admitting: Family Medicine

## 2014-08-13 VITALS — BP 132/80 | HR 76 | Temp 98.1°F | Ht 65.0 in | Wt 148.1 lb

## 2014-08-13 DIAGNOSIS — E7800 Pure hypercholesterolemia, unspecified: Secondary | ICD-10-CM

## 2014-08-13 DIAGNOSIS — Z0000 Encounter for general adult medical examination without abnormal findings: Secondary | ICD-10-CM

## 2014-08-13 DIAGNOSIS — Z23 Encounter for immunization: Secondary | ICD-10-CM

## 2014-08-13 DIAGNOSIS — F430 Acute stress reaction: Secondary | ICD-10-CM

## 2014-08-13 DIAGNOSIS — F439 Reaction to severe stress, unspecified: Secondary | ICD-10-CM | POA: Insufficient documentation

## 2014-08-13 DIAGNOSIS — R7303 Prediabetes: Secondary | ICD-10-CM | POA: Insufficient documentation

## 2014-08-13 DIAGNOSIS — I10 Essential (primary) hypertension: Secondary | ICD-10-CM

## 2014-08-13 DIAGNOSIS — R739 Hyperglycemia, unspecified: Secondary | ICD-10-CM

## 2014-08-13 MED ORDER — ALPRAZOLAM 0.5 MG PO TABS: 0.5000 mg | ORAL_TABLET | Freq: Every evening | ORAL | Status: DC | PRN

## 2014-08-13 NOTE — Progress Notes (Signed)
Pre visit review using our clinic review tool, if applicable. No additional management support is needed unless otherwise documented below in the visit note. 

## 2014-08-13 NOTE — Assessment & Plan Note (Signed)
Reviewed health habits including diet and exercise and skin cancer prevention Reviewed appropriate screening tests for age  Also reviewed health mt list, fam hx and immunization status , as well as social and family history    Labs reviewed  Will consider zoster vaccine if it is covered  Tdap and flu shots today

## 2014-08-13 NOTE — Progress Notes (Signed)
Subjective:    Patient ID: Taylor Ewing, female    DOB: Mar 07, 1951, 64 y.o.   MRN: [id number]  HPI Here for health maintenance exam and to review chronic medical problems    Doing fair overall  Has had a stressful year  Daughter had a still birth - very traumatic , she is struggling  Trying to help her  Taking fair care of herself   Wt is up 6 lb with bmi of 24 ? Stress eating  She is exercising - back to walking - 3-5 times per week   HIV screen - declines - not high risk   Zoster vaccine - has not had one  May be interested in one   Td 1/05- wants to update that today   Flu shot -will get today   Mammogram 7/15 Self exam -no lumps or changes   Pap 12/13 nl - no hx of abn paps  No gyn symptoms  Will wait until next physical   colonsoc 7/15 with 10 year recall  bp is up a bit today  No cp or palpitations or headaches or edema  No side effects to medicines  BP Readings from Last 3 Encounters:  08/13/14 144/78  06/20/13 128/82  06/15/12 130/80     Hyperlipidemia Lab Results  Component Value Date   CHOL 158 08/06/2014   CHOL 192 06/20/2013   CHOL 181 06/15/2012   Lab Results  Component Value Date   HDL 39.50 08/06/2014   HDL 44.40 06/20/2013   HDL 46.30 06/15/2012   No results found for: [facility] Lab Results  Component Value Date   TRIG 209.0* 08/06/2014   TRIG 269.0* 06/20/2013   TRIG 318.0* 06/15/2012   Lab Results  Component Value Date   CHOLHDL 4 08/06/2014   CHOLHDL 4 06/20/2013   CHOLHDL 4 06/15/2012   Lab Results  Component Value Date   LDLDIRECT 88.0 08/06/2014   LDLDIRECT 123.3 06/20/2013   LDLDIRECT 108.8 06/15/2012    On zocor and diet  Fairly stable with improved LDL and cholesterol Does avoid fatty foods - better lately   Liver fxn- tests up slt Lab Results  Component Value Date   ALT 54* 08/06/2014   AST 41* 08/06/2014   ALKPHOS 89 08/06/2014   BILITOT 0.4 08/06/2014     Glucose is 112   bp better on 2nd check  today- 132/80   Patient Active Problem List   Diagnosis Date Noted  . Routine general medical examination at a health care facility 06/15/2012  . Routine gynecological examination 06/15/2012  . Kidney stones 03/02/2012  . Essential hypertension 02/10/2012  . Breast cancer screening 09/28/2011  . NEOPLASM, SKIN, UNCERTAIN BEHAVIOR 93/81/0175  . LOW BACK PAIN, CHRONIC 08/14/2008  . PELVIC PAIN, CHRONIC 08/14/2008  . HYPERCHOLESTEROLEMIA 01/28/2007  . ANXIETY 01/28/2007  . ALLERGIC RHINITIS 01/28/2007  . ASTHMA 01/28/2007  . TMJ SYNDROME 01/28/2007  . MIGRAINES, HX OF 01/28/2007   Past Medical History  Diagnosis Date  . Allergy   . Anxiety   . Asthma   . TMJ (dislocation of temporomandibular joint)   . Migraines   . Cancer     skin, basal cell  . Insomnia   . Essential hypertension   . Kidney stones 03/02/2012   Past Surgical History  Procedure Laterality Date  . Tubal ligation    . Skin lesion excision    . Hemorrhoid surgery  11/2003   History  Substance Use Topics  . Smoking status: Never  Smoker   . Smokeless tobacco: Not on file  . Alcohol Use: Yes     Comment: 1-2 weekly   Family History  Problem Relation Age of Onset  . Depression Mother   . Depression Father   . Hyperlipidemia Sister   . Cancer Sister     thyroid  . Hyperlipidemia Brother   . Cancer Maternal Grandmother     breast  . Heart attack Maternal Grandmother   . Nephrolithiasis Daughter   . Colon polyps Daughter   . Cancer Maternal Grandfather     stomach   No Known Allergies Current Outpatient Prescriptions on File Prior to Visit  Medication Sig Dispense Refill  . lisinopril (PRINIVIL,ZESTRIL) 10 MG tablet TAKE ONE TABLET BY MOUTH ONCE DAILY. 90 tablet 0  . PROAIR HFA 108 (90 BASE) MCG/ACT inhaler INHALE TWO PUFFS UP TO  EVERY 4 HOURS AS NEEDED FOR WHEEZE. 9 each 2  . sertraline (ZOLOFT) 50 MG tablet TAKE ONE TABLET BY MOUTH ONCE DAILY 90 tablet 1  . simvastatin (ZOCOR) 40 MG tablet TAKE  ONE TABLET BY MOUTH ONCE DAILY. 90 tablet 0  . zolpidem (AMBIEN) 10 MG tablet TAKE ONE-HALF TABLET BY MOUTH AT BEDTIME AS NEEDED FOR SLEEP 45 tablet 0   No current facility-administered medications on file prior to visit.     Review of Systems Review of Systems  Constitutional: Negative for fever, appetite change, fatigue and unexpected weight change.  Eyes: Negative for pain and visual disturbance.  Respiratory: Negative for cough and shortness of breath.   Cardiovascular: Negative for cp or palpitations    Gastrointestinal: Negative for nausea, diarrhea and constipation.  Genitourinary: Negative for urgency and frequency.  Skin: Negative for pallor or rash   Neurological: Negative for weakness, light-headedness, numbness and headaches.  Hematological: Negative for adenopathy. Does not bruise/bleed easily.  Psychiatric/Behavioral: Negative for dysphoric mood. The patient is anxious with stressors lately        Objective:   Physical Exam  Constitutional: She appears well-developed and well-nourished. No distress.  overwt and well app  HENT:  Head: Normocephalic and atraumatic.  Right Ear: External ear normal.  Left Ear: External ear normal.  Mouth/Throat: Oropharynx is clear and moist.  Eyes: Conjunctivae and EOM are normal. Pupils are equal, round, and reactive to light. No scleral icterus.  Neck: Normal range of motion. Neck supple. No JVD present. Carotid bruit is not present. No thyromegaly present.  Cardiovascular: Normal rate, regular rhythm, normal heart sounds and intact distal pulses.  Exam reveals no gallop.   Pulmonary/Chest: Effort normal and breath sounds normal. No respiratory distress. She has no wheezes. She exhibits no tenderness.  Abdominal: Soft. Bowel sounds are normal. She exhibits no distension, no abdominal bruit and no mass. There is no tenderness.  Genitourinary: No breast swelling, tenderness, discharge or bleeding.  Breast exam: No mass, nodules,  thickening, tenderness, bulging, retraction, inflamation, nipple discharge or skin changes noted.  No axillary or clavicular LA.      Musculoskeletal: Normal range of motion. She exhibits no edema or tenderness.  Lymphadenopathy:    She has no cervical adenopathy.  Neurological: She is alert. She has normal reflexes. No cranial nerve deficit. She exhibits normal muscle tone. Coordination normal.  Skin: Skin is warm and dry. No rash noted. No erythema. No pallor.  Psychiatric: She has a normal mood and affect.          Assessment & Plan:   Problem List Items Addressed This Visit  Cardiovascular and Mediastinum   Essential hypertension    bp in fair control at this time   (better on 2nd check today) BP Readings from Last 1 Encounters:  08/13/14 132/80   No changes needed Disc lifstyle change with low sodium diet and exercise  Labs reviewed Enc to keep walking          Other   HYPERCHOLESTEROLEMIA    Disc goals for lipids and reasons to control them Rev labs with pt Rev low sat fat diet in detail Good control with zocor and diet Watching ast/alt -slt elevated today (disc poss of mild fatty liver)_ no symptoms       Relevant Orders   AST   ALT   Lipid panel   Hyperglycemia    Glucose fasting 112 Will continue to watch  Plan to check A1C in 6 mo  Disc low glycemic diet and exercise to prevent DM      Relevant Orders   Hemoglobin A1c   Routine general medical examination at a health care facility - Primary    Reviewed health habits including diet and exercise and skin cancer prevention Reviewed appropriate screening tests for age  Also reviewed health mt list, fam hx and immunization status , as well as social and family history    Labs reviewed  Will consider zoster vaccine if it is covered  Tdap and flu shots today         Stress reaction, emotional    Rev stressors- daughter had a still birth Reviewed stressors/ coping techniques/symptoms/ support  sources/ tx options and side effects in detail today  Given refill of xanax .05 to take with caution   Will update if symptoms worsen       Other Visit Diagnoses    Flu vaccine need        Relevant Orders    Flu Vaccine QUAD 36+ mos IM (Completed)    Need for Tdap vaccination        Relevant Orders    Tdap vaccine greater than or equal to 7yo IM (Completed)

## 2014-08-13 NOTE — Assessment & Plan Note (Signed)
Disc goals for lipids and reasons to control them Rev labs with pt Rev low sat fat diet in detail Good control with zocor and diet Watching ast/alt -slt elevated today (disc poss of mild fatty liver)_ no symptoms

## 2014-08-13 NOTE — Assessment & Plan Note (Signed)
Rev stressors- daughter had a still birth Reviewed stressors/ coping techniques/symptoms/ support sources/ tx options and side effects in detail today  Given refill of xanax .05 to take with caution   Will update if symptoms worsen

## 2014-08-13 NOTE — Assessment & Plan Note (Signed)
bp in fair control at this time   (better on 2nd check today) BP Readings from Last 1 Encounters:  08/13/14 132/80   No changes needed Disc lifstyle change with low sodium diet and exercise  Labs reviewed Enc to keep walking

## 2014-08-13 NOTE — Patient Instructions (Signed)
Tdap vaccine today  Flu shot today   If you are interested in a shingles/zoster vaccine - call your insurance to check on coverage,( you should not get it within 1 month of other vaccines) , then call us for a prescription  for it to take to a pharmacy that gives the shot , or make a nurse visit to get it here depending on your coverage   Watch the sugar and fat in diet  Keep walking   Your liver tests are slightly up - we will watch this Also sugar level   Schedule fasting lab in 6 months please

## 2014-08-13 NOTE — Assessment & Plan Note (Signed)
Glucose fasting 112 Will continue to watch  Plan to check A1C in 6 mo  Disc low glycemic diet and exercise to prevent DM

## 2014-08-16 ENCOUNTER — Telehealth: Payer: Self-pay | Admitting: Family Medicine

## 2014-08-16 NOTE — Telephone Encounter (Signed)
emmi emailed °

## 2014-09-19 ENCOUNTER — Ambulatory Visit (INDEPENDENT_AMBULATORY_CARE_PROVIDER_SITE_OTHER): Payer: 59 | Admitting: Family Medicine

## 2014-09-19 ENCOUNTER — Encounter: Payer: Self-pay | Admitting: Family Medicine

## 2014-09-19 VITALS — BP 148/80 | HR 76 | Temp 97.9°F | Wt 147.1 lb

## 2014-09-19 DIAGNOSIS — J4521 Mild intermittent asthma with (acute) exacerbation: Secondary | ICD-10-CM

## 2014-09-19 DIAGNOSIS — I10 Essential (primary) hypertension: Secondary | ICD-10-CM

## 2014-09-19 DIAGNOSIS — R05 Cough: Secondary | ICD-10-CM

## 2014-09-19 DIAGNOSIS — M25511 Pain in right shoulder: Secondary | ICD-10-CM

## 2014-09-19 DIAGNOSIS — R059 Cough, unspecified: Secondary | ICD-10-CM

## 2014-09-19 DIAGNOSIS — M25519 Pain in unspecified shoulder: Secondary | ICD-10-CM | POA: Insufficient documentation

## 2014-09-19 MED ORDER — LOSARTAN POTASSIUM 50 MG PO TABS: 50.0000 mg | ORAL_TABLET | Freq: Every day | ORAL | Status: DC

## 2014-09-19 MED ORDER — ALBUTEROL SULFATE HFA 108 (90 BASE) MCG/ACT IN AERS: INHALATION_SPRAY | RESPIRATORY_TRACT | Status: DC

## 2014-09-19 MED ORDER — PREDNISONE 10 MG PO TABS: ORAL_TABLET | ORAL | Status: DC

## 2014-09-19 NOTE — Progress Notes (Signed)
Pre visit review using our clinic review tool, if applicable. No additional management support is needed unless otherwise documented below in the visit note. 

## 2014-09-19 NOTE — Progress Notes (Signed)
Subjective:    Patient ID: Taylor Ewing, female    DOB: 1950-12-16, 64 y.o.   MRN: [id number]  HPI Here with cough/ asthma symptoms   Could be exacerbated by allergies  Sometimes feels like she cannot get a good "inhale" Come chest congestion  hacky dry cough -nothing coming up    If she uses her proair -will cough a bit of mucous up   Some episodes of  R shoulder pain  Improves with aleve  A deep ache - "a tendonitis type pain" --usually in R  No injury  Does not seem to be positional  Comes on occ- with no warning and won't get better until she takes med  No chest pain  Not exertional at all  (does not come on with sob or cough)   Patient Active Problem List   Diagnosis Date Noted  . Hyperglycemia 08/13/2014  . Stress reaction, emotional 08/13/2014  . Routine general medical examination at a health care facility 06/15/2012  . Routine gynecological examination 06/15/2012  . Kidney stones 03/02/2012  . Essential hypertension 02/10/2012  . Breast cancer screening 09/28/2011  . NEOPLASM, SKIN, UNCERTAIN BEHAVIOR 26/71/2458  . LOW BACK PAIN, CHRONIC 08/14/2008  . PELVIC PAIN, CHRONIC 08/14/2008  . HYPERCHOLESTEROLEMIA 01/28/2007  . ANXIETY 01/28/2007  . ALLERGIC RHINITIS 01/28/2007  . ASTHMA 01/28/2007  . TMJ SYNDROME 01/28/2007  . MIGRAINES, HX OF 01/28/2007   Past Medical History  Diagnosis Date  . Allergy   . Anxiety   . Asthma   . TMJ (dislocation of temporomandibular joint)   . Migraines   . Cancer     skin, basal cell  . Insomnia   . Essential hypertension   . Kidney stones 03/02/2012   Past Surgical History  Procedure Laterality Date  . Tubal ligation    . Skin lesion excision    . Hemorrhoid surgery  11/2003   History  Substance Use Topics  . Smoking status: Never Smoker   . Smokeless tobacco: Not on file  . Alcohol Use: Yes     Comment: 1-2 weekly   Family History  Problem Relation Age of Onset  . Depression Mother   . Depression  Father   . Hyperlipidemia Sister   . Cancer Sister     thyroid  . Hyperlipidemia Brother   . Cancer Maternal Grandmother     breast  . Heart attack Maternal Grandmother   . Nephrolithiasis Daughter   . Colon polyps Daughter   . Cancer Maternal Grandfather     stomach   No Known Allergies Current Outpatient Prescriptions on File Prior to Visit  Medication Sig Dispense Refill  . ALPRAZolam (XANAX) 0.5 MG tablet Take 1 tablet (0.5 mg total) by mouth at bedtime as needed for anxiety. 30 tablet 0  . lisinopril (PRINIVIL,ZESTRIL) 10 MG tablet TAKE ONE TABLET BY MOUTH ONCE DAILY. 90 tablet 0  . PROAIR HFA 108 (90 BASE) MCG/ACT inhaler INHALE TWO PUFFS UP TO  EVERY 4 HOURS AS NEEDED FOR WHEEZE. 9 each 2  . sertraline (ZOLOFT) 50 MG tablet TAKE ONE TABLET BY MOUTH ONCE DAILY 90 tablet 1  . simvastatin (ZOCOR) 40 MG tablet TAKE ONE TABLET BY MOUTH ONCE DAILY. 90 tablet 0  . zolpidem (AMBIEN) 10 MG tablet TAKE ONE-HALF TABLET BY MOUTH AT BEDTIME AS NEEDED FOR SLEEP 45 tablet 0   No current facility-administered medications on file prior to visit.    Review of Systems    Review of Systems  Constitutional: Negative for fever, appetite change, fatigue and unexpected weight change.  Eyes: Negative for pain and visual disturbance.  ENT pos for rhinorrhea / neg for sinus pain  Respiratory: pos for cough, wheeze (tightness) and sob   Cardiovascular: Negative for cp or palpitations    Gastrointestinal: Negative for nausea, diarrhea and constipation.  Genitourinary: Negative for urgency and frequency.  Skin: Negative for pallor or rash   MSK pos for R shoulder pain  Neurological: Negative for weakness, light-headedness, numbness and headaches.  Hematological: Negative for adenopathy. Does not bruise/bleed easily.  Psychiatric/Behavioral: Negative for dysphoric mood. The patient is not nervous/anxious.       Objective:   Physical Exam  Constitutional: She appears well-developed and  well-nourished. No distress.  HENT:  Head: Normocephalic and atraumatic.  Right Ear: External ear normal.  Left Ear: External ear normal.  Mouth/Throat: Oropharynx is clear and moist. No oropharyngeal exudate.  Nares are boggy Clear rhinorrhea  Throat clear   Eyes: Conjunctivae and EOM are normal. Pupils are equal, round, and reactive to light. Right eye exhibits no discharge. Left eye exhibits no discharge. No scleral icterus.  Neck: Normal range of motion. Neck supple.  Cardiovascular: Normal rate and regular rhythm.   Pulmonary/Chest: Effort normal. No respiratory distress. She has wheezes. She has no rales. She exhibits no tenderness.  Scant wheeze on forced exp only  Nl exp phase  No rales/rhonchi or crackles   Musculoskeletal: She exhibits tenderness. She exhibits no edema.       Right shoulder: She exhibits decreased range of motion, tenderness and bony tenderness. She exhibits no swelling, no effusion, no crepitus and normal pulse.  R shoulder:  Pos Hawking and Neer tests  Pain with ext and int rotation  Pain with extension/apprehension Mild acromion tenderness  No neuro signs   Lymphadenopathy:    She has no cervical adenopathy.  Neurological: She is alert. She has normal strength. She displays no atrophy. She exhibits normal muscle tone.  Skin: Skin is warm and dry. No rash noted. No erythema. No pallor.  Psychiatric: She has a normal mood and affect.          Assessment & Plan:   Problem List Items Addressed This Visit      Cardiovascular and Mediastinum   Essential hypertension    Pt has a cough on ace -unsure if that is the cause Will try switch to losartan 50 mg and see if this resolved Pt will update  Disc goals for HTN      Relevant Medications   losartan (COZAAR) tablet     Respiratory   Asthma - Primary    Some evidence of cough variant symptoms - poss from allergens or uri  Will try prednisone taper 30 mg -disc side eff  Continue albuterol hfa  prn  Update if not starting to improve in a week or if worsening   May need to add steroid inhaler if not improved for long term       Relevant Medications   predniSONE (DELTASONE) tablet   albuterol (PROAIR HFA) 108 (90 BASE) MCG/ACT inhaler     Other   Cough   Pain in joint, shoulder region    Features of tendonitis/bursitis  Will try prednisone taper 30 mg -disc side eff Use of ice/ cold comp and relative rest  Passive rom exercises If not improved-recommend visit to sport med

## 2014-09-19 NOTE — Patient Instructions (Signed)
For cough - stop lisinopril and take losartan for blood pressure instead  For wheeze/tight chest -take prednisone as directed  If nasal allergy symptoms -take zyrtec over the counter daily  For shoulder - ice when it bothers you , try not to lift repedatively and the prednisone should also help   If not better in 2 weeks let me know  If shoulder is not better we will get you to Dr Lorelei Pont (our sport med doctor)

## 2014-09-20 NOTE — Assessment & Plan Note (Signed)
Pt has a cough on ace -unsure if that is the cause Will try switch to losartan 50 mg and see if this resolved Pt will update  Disc goals for HTN

## 2014-09-20 NOTE — Assessment & Plan Note (Signed)
Features of tendonitis/bursitis  Will try prednisone taper 30 mg -disc side eff Use of ice/ cold comp and relative rest  Passive rom exercises If not improved-recommend visit to sport med

## 2014-09-20 NOTE — Assessment & Plan Note (Signed)
Some evidence of cough variant symptoms - poss from allergens or uri  Will try prednisone taper 30 mg -disc side eff  Continue albuterol hfa prn  Update if not starting to improve in a week or if worsening   May need to add steroid inhaler if not improved for long term

## 2014-10-02 ENCOUNTER — Other Ambulatory Visit: Payer: Self-pay | Admitting: Family Medicine

## 2014-11-15 ENCOUNTER — Other Ambulatory Visit: Payer: Self-pay | Admitting: *Deleted

## 2014-11-15 MED ORDER — SERTRALINE HCL 50 MG PO TABS: 50.0000 mg | ORAL_TABLET | Freq: Every day | ORAL | Status: DC

## 2014-11-15 MED ORDER — SIMVASTATIN 40 MG PO TABS: 40.0000 mg | ORAL_TABLET | Freq: Every day | ORAL | Status: DC

## 2014-11-15 NOTE — Telephone Encounter (Signed)
Changed Rx from [facility] to [facility], Rx sent

## 2014-11-16 ENCOUNTER — Other Ambulatory Visit: Payer: Self-pay | Admitting: *Deleted

## 2014-11-16 MED ORDER — ALPRAZOLAM 0.5 MG PO TABS: 0.5000 mg | ORAL_TABLET | Freq: Every evening | ORAL | Status: DC | PRN

## 2014-11-16 MED ORDER — ZOLPIDEM TARTRATE 10 MG PO TABS: ORAL_TABLET | ORAL | Status: DC

## 2014-11-16 MED ORDER — LOSARTAN POTASSIUM 50 MG PO TABS: 50.0000 mg | ORAL_TABLET | Freq: Every day | ORAL | Status: DC

## 2014-11-16 NOTE — Telephone Encounter (Signed)
Printed to fax  

## 2014-11-16 NOTE — Telephone Encounter (Signed)
Fax refill request, would need paper Rx to fax since it's a mail order, pt had her CPE on 08/13/14 please advise

## 2014-11-16 NOTE — Telephone Encounter (Signed)
Rx's faxed to [facility]

## 2015-01-14 LAB — HM MAMMOGRAPHY: HM Mammogram: NORMAL

## 2015-01-16 ENCOUNTER — Encounter: Payer: Self-pay | Admitting: Family Medicine

## 2015-01-17 ENCOUNTER — Encounter: Payer: Self-pay | Admitting: *Deleted

## 2015-01-17 ENCOUNTER — Encounter: Payer: Self-pay | Admitting: Family Medicine

## 2015-02-08 ENCOUNTER — Other Ambulatory Visit: Payer: Self-pay | Admitting: *Deleted

## 2015-02-08 MED ORDER — ZOLPIDEM TARTRATE 10 MG PO TABS: ORAL_TABLET | ORAL | Status: DC

## 2015-02-08 NOTE — Telephone Encounter (Signed)
Rx faxed

## 2015-02-08 NOTE — Telephone Encounter (Signed)
Printed to fax  

## 2015-02-08 NOTE — Telephone Encounter (Signed)
Fax refill request, pt had CPE on 08/13/14 and 6 month f/u labs on 02/12/15, last refilled on 11/16/14 #45 with 0 refills, since it's a mail order pharmacy if approved we would need paper Rx to fax to [facility], please advise

## 2015-02-12 ENCOUNTER — Other Ambulatory Visit (INDEPENDENT_AMBULATORY_CARE_PROVIDER_SITE_OTHER): Payer: 59

## 2015-02-12 ENCOUNTER — Encounter: Payer: Self-pay | Admitting: *Deleted

## 2015-02-12 DIAGNOSIS — E7800 Pure hypercholesterolemia, unspecified: Secondary | ICD-10-CM

## 2015-02-12 DIAGNOSIS — R739 Hyperglycemia, unspecified: Secondary | ICD-10-CM

## 2015-02-12 LAB — LIPID PANEL
CHOLESTEROL: 148 mg/dL (ref 0–200)
HDL: 34.5 mg/dL — ABNORMAL LOW (ref >39.00)
LDL Cholesterol: 76 mg/dL (ref 0–99)
NONHDL: 113.91
Total CHOL/HDL Ratio: 4
Triglycerides: 191 mg/dL — ABNORMAL HIGH (ref 0.0–149.0)
VLDL: 38.2 mg/dL (ref 0.0–40.0)

## 2015-02-12 LAB — HEMOGLOBIN A1C: Hgb A1c MFr Bld: 5.6 % (ref 4.6–6.5)

## 2015-02-12 LAB — AST: AST: 35 U/L (ref 0–37)

## 2015-02-12 LAB — ALT: ALT: 41 U/L — AB (ref 0–35)

## 2015-02-13 ENCOUNTER — Encounter: Payer: Self-pay | Admitting: *Deleted

## 2015-02-28 ENCOUNTER — Encounter: Payer: Self-pay | Admitting: Family Medicine

## 2015-03-27 ENCOUNTER — Other Ambulatory Visit: Payer: Self-pay | Admitting: Family Medicine

## 2015-05-16 ENCOUNTER — Other Ambulatory Visit: Payer: Self-pay | Admitting: *Deleted

## 2015-05-16 MED ORDER — ZOLPIDEM TARTRATE 10 MG PO TABS: ORAL_TABLET | ORAL | Status: DC

## 2015-05-16 NOTE — Telephone Encounter (Signed)
Since it's a mail order pharmacy we have to fax a paper Rx over, Rx printed and placed in Dr. Marliss Coots inbox for signing tomorrow

## 2015-05-16 NOTE — Telephone Encounter (Signed)
Px written for call in   

## 2015-05-16 NOTE — Telephone Encounter (Signed)
Pt has CPE on 08/13/14, last refilled on 02/08/15 #45 with 0 refills, please advise

## 2015-05-17 NOTE — Telephone Encounter (Signed)
Dr. Glori Bickers signed Rx and I faxed it to [facility] mail order pharmacy

## 2015-08-16 ENCOUNTER — Other Ambulatory Visit: Payer: Self-pay | Admitting: *Deleted

## 2015-08-16 MED ORDER — ZOLPIDEM TARTRATE 10 MG PO TABS: ORAL_TABLET | ORAL | Status: DC

## 2015-08-16 NOTE — Telephone Encounter (Signed)
Printed for fax

## 2015-08-16 NOTE — Telephone Encounter (Signed)
Last refilled on 05/16/15 #45 with 0 refills, pt has CPE scheduled 09/03/15, if approved I would need a paper Rx faxed since it's a Mail order

## 2015-08-19 NOTE — Telephone Encounter (Signed)
Rx faxed to [facility].

## 2015-08-25 ENCOUNTER — Telehealth: Payer: Self-pay | Admitting: Family Medicine

## 2015-08-25 DIAGNOSIS — Z0000 Encounter for general adult medical examination without abnormal findings: Secondary | ICD-10-CM

## 2015-08-25 DIAGNOSIS — R739 Hyperglycemia, unspecified: Secondary | ICD-10-CM

## 2015-08-25 NOTE — Telephone Encounter (Signed)
-----   Message from Marchia Bond sent at 08/22/2015 10:56 AM EST ----- Regarding: Cpx labs Tues 2/21, need orders. Thanks! :) Please order  future cpx labs for pt's upcoming lab appt. Thanks Aniceto Boss

## 2015-08-27 ENCOUNTER — Other Ambulatory Visit (INDEPENDENT_AMBULATORY_CARE_PROVIDER_SITE_OTHER): Payer: 59

## 2015-08-27 DIAGNOSIS — R739 Hyperglycemia, unspecified: Secondary | ICD-10-CM

## 2015-08-27 DIAGNOSIS — Z0000 Encounter for general adult medical examination without abnormal findings: Secondary | ICD-10-CM | POA: Diagnosis not present

## 2015-08-27 LAB — LIPID PANEL
CHOL/HDL RATIO: 4
Cholesterol: 159 mg/dL (ref 0–200)
HDL: 42.4 mg/dL (ref >39.00)
LDL Cholesterol: 90 mg/dL (ref 0–99)
NONHDL: 116.45
Triglycerides: 133 mg/dL (ref 0.0–149.0)
VLDL: 26.6 mg/dL (ref 0.0–40.0)

## 2015-08-27 LAB — CBC WITH DIFFERENTIAL/PLATELET
BASOS ABS: 0 10*3/uL (ref 0.0–0.1)
BASOS PCT: 0.4 % (ref 0.0–3.0)
Eosinophils Absolute: 0.2 10*3/uL (ref 0.0–0.7)
Eosinophils Relative: 3.1 % (ref 0.0–5.0)
HEMATOCRIT: 40.2 % (ref 36.0–46.0)
HEMOGLOBIN: 13.8 g/dL (ref 12.0–15.0)
LYMPHS PCT: 29.1 % (ref 12.0–46.0)
Lymphs Abs: 1.5 10*3/uL (ref 0.7–4.0)
MCHC: 34.4 g/dL (ref 30.0–36.0)
MCV: 89.3 fl (ref 78.0–100.0)
MONOS PCT: 10.4 % (ref 3.0–12.0)
Monocytes Absolute: 0.5 10*3/uL (ref 0.1–1.0)
NEUTROS ABS: 3 10*3/uL (ref 1.4–7.7)
Neutrophils Relative %: 57 % (ref 43.0–77.0)
PLATELETS: 286 10*3/uL (ref 150.0–400.0)
RBC: 4.51 Mil/uL (ref 3.87–5.11)
RDW: 13.1 % (ref 11.5–15.5)
WBC: 5.3 10*3/uL (ref 4.0–10.5)

## 2015-08-27 LAB — COMPREHENSIVE METABOLIC PANEL
ALBUMIN: 4.8 g/dL (ref 3.5–5.2)
ALK PHOS: 86 U/L (ref 39–117)
ALT: 37 U/L — AB (ref 0–35)
AST: 31 U/L (ref 0–37)
BILIRUBIN TOTAL: 0.3 mg/dL (ref 0.2–1.2)
BUN: 16 mg/dL (ref 6–23)
CALCIUM: 9.6 mg/dL (ref 8.4–10.5)
CO2: 29 meq/L (ref 19–32)
Chloride: 107 mEq/L (ref 96–112)
Creatinine, Ser: 0.8 mg/dL (ref 0.40–1.20)
GFR: 77.1 mL/min (ref >60.00)
Glucose, Bld: 111 mg/dL — ABNORMAL HIGH (ref 70–99)
Potassium: 4.4 mEq/L (ref 3.5–5.1)
Sodium: 143 mEq/L (ref 135–145)
TOTAL PROTEIN: 7.3 g/dL (ref 6.0–8.3)

## 2015-08-27 LAB — HEMOGLOBIN A1C: HEMOGLOBIN A1C: 5.6 % (ref 4.6–6.5)

## 2015-08-27 LAB — TSH: TSH: 1.52 u[IU]/mL (ref 0.35–4.50)

## 2015-09-03 ENCOUNTER — Other Ambulatory Visit (HOSPITAL_COMMUNITY)
Admission: RE | Admit: 2015-09-03 | Discharge: 2015-09-03 | Disposition: A | Discharge status: Home / Self Care | Payer: 59 | Source: Ambulatory Visit | Attending: Family Medicine | Admitting: Family Medicine

## 2015-09-03 ENCOUNTER — Encounter: Payer: Self-pay | Admitting: Family Medicine

## 2015-09-03 ENCOUNTER — Ambulatory Visit (INDEPENDENT_AMBULATORY_CARE_PROVIDER_SITE_OTHER): Payer: 59 | Admitting: Family Medicine

## 2015-09-03 VITALS — BP 130/72 | HR 65 | Temp 98.1°F | Ht 64.5 in | Wt 145.0 lb

## 2015-09-03 DIAGNOSIS — I10 Essential (primary) hypertension: Secondary | ICD-10-CM

## 2015-09-03 DIAGNOSIS — Z01419 Encounter for gynecological examination (general) (routine) without abnormal findings: Secondary | ICD-10-CM

## 2015-09-03 DIAGNOSIS — Z1159 Encounter for screening for other viral diseases: Secondary | ICD-10-CM | POA: Insufficient documentation

## 2015-09-03 DIAGNOSIS — E7800 Pure hypercholesterolemia, unspecified: Secondary | ICD-10-CM | POA: Diagnosis not present

## 2015-09-03 DIAGNOSIS — Z1151 Encounter for screening for human papillomavirus (HPV): Secondary | ICD-10-CM | POA: Insufficient documentation

## 2015-09-03 DIAGNOSIS — Z0000 Encounter for general adult medical examination without abnormal findings: Secondary | ICD-10-CM

## 2015-09-03 DIAGNOSIS — R739 Hyperglycemia, unspecified: Secondary | ICD-10-CM

## 2015-09-03 DIAGNOSIS — Z23 Encounter for immunization: Secondary | ICD-10-CM

## 2015-09-03 DIAGNOSIS — Z114 Encounter for screening for human immunodeficiency virus [HIV]: Secondary | ICD-10-CM | POA: Insufficient documentation

## 2015-09-03 LAB — HM PAP SMEAR: HM PAP: NORMAL

## 2015-09-03 NOTE — Progress Notes (Signed)
Subjective:    Patient ID: Taylor Ewing, female    DOB: 10-12-1950, 65 y.o.   MRN: [id number]  HPI Here for health maintenance exam and to review chronic medical problems    Is feeling pretty well  Eating well - is conscious of eating  Exercise -not enough   Wt is down 2 lb with bmi of 24- good   Does not think she is high risk for hep C or HIV  Wants to be screened  Zoster vaccine - is interested- will check with her ins about coverage   Has not had a flu shot  Will get one today   Pap 12/13 - was normal  Due for that  No gyn symptoms  No post menopausal bleeding   Mm 7/16 - normal  No lumps on self exam  Goes to [facility] -will get reminded   Colonoscopy - 7/15- normal -=10 year recall   Td 2/16   bp is stable today - it tends to fluctuates / does not have her own cuff   No cp or palpitations or headaches or edema  No side effects to medicines  BP Readings from Last 3 Encounters:  09/03/15 140/88  09/19/14 148/80  08/13/14 132/80       Chemistry      Component Value Date/Time   NA 143 08/27/2015 0847   K 4.4 08/27/2015 0847   CL 107 08/27/2015 0847   CO2 29 08/27/2015 0847   BUN 16 08/27/2015 0847   CREATININE 0.80 08/27/2015 0847      Component Value Date/Time   CALCIUM 9.6 08/27/2015 0847   ALKPHOS 86 08/27/2015 0847   AST 31 08/27/2015 0847   ALT 37* 08/27/2015 0847   BILITOT 0.3 08/27/2015 0847      Glucose control -  Lab Results  Component Value Date   HGBA1C 5.6 08/27/2015   Under DM range  Is careful about sugar intake   Cholesterol  Lab Results  Component Value Date   CHOL 159 08/27/2015   CHOL 148 02/12/2015   CHOL 158 08/06/2014   Lab Results  Component Value Date   HDL 42.40 08/27/2015   HDL 34.50* 02/12/2015   HDL 39.50 08/06/2014   Lab Results  Component Value Date   LDLCALC 90 08/27/2015   LDLCALC 76 02/12/2015   Lab Results  Component Value Date   TRIG 133.0 08/27/2015   TRIG 191.0* 02/12/2015   TRIG 209.0*  08/06/2014   Lab Results  Component Value Date   CHOLHDL 4 08/27/2015   CHOLHDL 4 02/12/2015   CHOLHDL 4 08/06/2014   Lab Results  Component Value Date   LDLDIRECT 88.0 08/06/2014   LDLDIRECT 123.3 06/20/2013   LDLDIRECT 108.8 06/15/2012   Stable and in the range of recommended  Exercise would further help HDL   Liver Lab Results  Component Value Date   ALT 37* 08/27/2015   AST 31 08/27/2015   ALKPHOS 86 08/27/2015   BILITOT 0.3 08/27/2015   occ etoh- 2-3 drinks per week max occ acetaminophen On simvastatin No symptoms  Patient Active Problem List   Diagnosis Date Noted  . Screening for HIV (human immunodeficiency virus) 09/03/2015  . Need for hepatitis C screening test 09/03/2015  . Pain in joint, shoulder region 09/19/2014  . Hyperglycemia 08/13/2014  . Stress reaction, emotional 08/13/2014  . Routine general medical examination at a health care facility 06/15/2012  . Encounter for routine gynecological examination 06/15/2012  . Essential hypertension 02/10/2012  .  Breast cancer screening 09/28/2011  . LOW BACK PAIN, CHRONIC 08/14/2008  . HYPERCHOLESTEROLEMIA 01/28/2007  . ANXIETY 01/28/2007  . ALLERGIC RHINITIS 01/28/2007  . Asthma 01/28/2007  . TMJ SYNDROME 01/28/2007  . MIGRAINES, HX OF 01/28/2007   Past Medical History  Diagnosis Date  . Allergy   . Anxiety   . Asthma   . TMJ (dislocation of temporomandibular joint)   . Migraines   . Cancer ([place])     skin, basal cell  . Insomnia   . Essential hypertension   . Kidney stones 03/02/2012   Past Surgical History  Procedure Laterality Date  . Tubal ligation    . Skin lesion excision    . Hemorrhoid surgery  11/2003   Social History  Substance Use Topics  . Smoking status: Never Smoker   . Smokeless tobacco: None  . Alcohol Use: 0.0 oz/week    0 Standard drinks or equivalent per week     Comment: 1-2 weekly   Family History  Problem Relation Age of Onset  . Depression Mother   . Depression  Father   . Hyperlipidemia Sister   . Cancer Sister     thyroid  . Hyperlipidemia Brother   . Cancer Maternal Grandmother     breast  . Heart attack Maternal Grandmother   . Nephrolithiasis Daughter   . Colon polyps Daughter   . Cancer Maternal Grandfather     stomach   No Known Allergies Current Outpatient Prescriptions on File Prior to Visit  Medication Sig Dispense Refill  . albuterol (PROAIR HFA) 108 (90 BASE) MCG/ACT inhaler INHALE TWO PUFFS UP TO  EVERY 4 HOURS AS NEEDED FOR WHEEZE. 1 each 11  . ALPRAZolam (XANAX) 0.5 MG tablet Take 1 tablet (0.5 mg total) by mouth at bedtime as needed for anxiety. 90 tablet 0  . losartan (COZAAR) 50 MG tablet Take 1 tablet (50 mg total) by mouth daily. 90 tablet 3  . sertraline (ZOLOFT) 50 MG tablet Take 1 tablet (50 mg total) by mouth daily. 90 tablet 1  . simvastatin (ZOCOR) 40 MG tablet Take 1 tablet (40 mg total) by mouth daily. 90 tablet 3  . zolpidem (AMBIEN) 10 MG tablet TAKE ONE-HALF TABLET BY MOUTH AT BEDTIME AS NEEDED FOR SLEEP 45 tablet 0   No current facility-administered medications on file prior to visit.     Review of Systems Review of Systems  Constitutional: Negative for fever, appetite change, fatigue and unexpected weight change.  Eyes: Negative for pain and visual disturbance.  Respiratory: Negative for cough and shortness of breath.   Cardiovascular: Negative for cp or palpitations    Gastrointestinal: Negative for nausea, diarrhea and constipation.  Genitourinary: Negative for urgency and frequency.  Skin: Negative for pallor or rash   Neurological: Negative for weakness, light-headedness, numbness and headaches.  Hematological: Negative for adenopathy. Does not bruise/bleed easily.  Psychiatric/Behavioral: Negative for dysphoric mood. The patient is not nervous/anxious.         Objective:   Physical Exam  Constitutional: She appears well-developed and well-nourished. No distress.  Well appearing   HENT:    Head: Normocephalic and atraumatic.  Right Ear: External ear normal.  Left Ear: External ear normal.  Mouth/Throat: Oropharynx is clear and moist.  Eyes: Conjunctivae and EOM are normal. Pupils are equal, round, and reactive to light. No scleral icterus.  Neck: Normal range of motion. Neck supple. No JVD present. Carotid bruit is not present. No thyromegaly present.  Cardiovascular: Normal rate, regular rhythm,  normal heart sounds and intact distal pulses.  Exam reveals no gallop.   Pulmonary/Chest: Effort normal and breath sounds normal. No respiratory distress. She has no wheezes. She exhibits no tenderness.  Abdominal: Soft. Bowel sounds are normal. She exhibits no distension, no abdominal bruit and no mass. There is no tenderness.  Genitourinary: Vagina normal and uterus normal. No breast swelling, tenderness, discharge or bleeding. There is no rash, tenderness or lesion on the right labia. There is no rash, tenderness or lesion on the left labia. Uterus is not enlarged and not tender. Cervix exhibits no motion tenderness, no discharge and no friability. Right adnexum displays no mass, no tenderness and no fullness. Left adnexum displays no mass, no tenderness and no fullness. No bleeding in the vagina. No vaginal discharge found.  Breast exam: No mass, nodules, thickening, tenderness, bulging, retraction, inflamation, nipple discharge or skin changes noted.  No axillary or clavicular LA.      Musculoskeletal: Normal range of motion. She exhibits no edema or tenderness.  Lymphadenopathy:    She has no cervical adenopathy.  Neurological: She is alert. She has normal reflexes. No cranial nerve deficit. She exhibits normal muscle tone. Coordination normal.  Skin: Skin is warm and dry. No rash noted. No erythema. No pallor.  Psychiatric: She has a normal mood and affect.          Assessment & Plan:   Problem List Items Addressed This Visit      Cardiovascular and Mediastinum   Essential  hypertension - Primary    bp in fair control at this time  BP Readings from Last 1 Encounters:  09/03/15 130/72   No changes needed Disc lifstyle change with low sodium diet and exercise  Labs reviewed Enc exercise         Other   Encounter for routine gynecological examination    Pap and exam done  No c/o      Relevant Orders   Cytology - PAP   HYPERCHOLESTEROLEMIA    Lipids are controlled Disc goals for lipids and reasons to control them Rev labs with pt Rev low sat fat diet in detail Enc more exercise to raise HDL further       Hyperglycemia    Lab Results  Component Value Date   HGBA1C 5.6 08/27/2015   This is stable  Enc low glycemic diet and wt control to prevent DM in the future       Need for hepatitis C screening test    Hep C screen today       Relevant Orders   Hepatitis C antibody   Routine general medical examination at a health care facility    Reviewed health habits including diet and exercise and skin cancer prevention Reviewed appropriate screening tests for age  Also reviewed health mt list, fam hx and immunization status , as well as social and family history   See HPI Labs reviewed  Think about an exercise program  DASH eating plan- handout -take a look  Flu shot today If you are interested in a shingles/zoster vaccine - call your insurance to check on coverage,( you should not get it within 1 month of other vaccines) , then call us for a prescription  for it to take to a pharmacy that gives the shot , or make a nurse visit to get it here depending on your coverage  mammogram is due in July  Pap done today       Screening for HIV (  human immunodeficiency virus)    HIV screen today      Relevant Orders   HIV antibody (with reflex)    Other Visit Diagnoses    Need for influenza vaccination        Relevant Orders    Flu Vaccine QUAD 36+ mos PF IM (Fluarix & Fluzone Quad PF) (Completed)

## 2015-09-03 NOTE — Progress Notes (Signed)
Pre visit review using our clinic review tool, if applicable. No additional management support is needed unless otherwise documented below in the visit note. 

## 2015-09-03 NOTE — Assessment & Plan Note (Signed)
- 

## 2015-09-03 NOTE — Assessment & Plan Note (Signed)
Reviewed health habits including diet and exercise and skin cancer prevention Reviewed appropriate screening tests for age  Also reviewed health mt list, fam hx and immunization status , as well as social and family history   See HPI Labs reviewed  Think about an exercise program  DASH eating plan- handout -take a look  Flu shot today If you are interested in a shingles/zoster vaccine - call your insurance to check on coverage,( you should not get it within 1 month of other vaccines) , then call us for a prescription  for it to take to a pharmacy that gives the shot , or make a nurse visit to get it here depending on your coverage  mammogram is due in July  Pap done today

## 2015-09-03 NOTE — Assessment & Plan Note (Signed)
Lab Results  Component Value Date   HGBA1C 5.6 08/27/2015   This is stable  Enc low glycemic diet and wt control to prevent DM in the future

## 2015-09-03 NOTE — Assessment & Plan Note (Signed)
Lipids are controlled Disc goals for lipids and reasons to control them Rev labs with pt Rev low sat fat diet in detail Enc more exercise to raise HDL further

## 2015-09-03 NOTE — Assessment & Plan Note (Signed)
Hep C screen today 

## 2015-09-03 NOTE — Assessment & Plan Note (Signed)
Pap and exam done  No c/o 

## 2015-09-03 NOTE — Assessment & Plan Note (Signed)
bp in fair control at this time  BP Readings from Last 1 Encounters:  09/03/15 130/72   No changes needed Disc lifstyle change with low sodium diet and exercise  Labs reviewed Enc exercise

## 2015-09-03 NOTE — Patient Instructions (Signed)
Think about an exercise program  DASH eating plan- handout -take a look  Flu shot today If you are interested in a shingles/zoster vaccine - call your insurance to check on coverage,( you should not get it within 1 month of other vaccines) , then call us for a prescription  for it to take to a pharmacy that gives the shot , or make a nurse visit to get it here depending on your coverage  mammogram is due in July  Pap done today

## 2015-09-04 LAB — HEPATITIS C ANTIBODY: HCV Ab: NEGATIVE

## 2015-09-04 LAB — HIV ANTIBODY (ROUTINE TESTING W REFLEX): HIV 1&2 Ab, 4th Generation: NONREACTIVE

## 2015-09-05 LAB — CYTOLOGY - PAP

## 2015-09-06 ENCOUNTER — Telehealth: Payer: Self-pay | Admitting: *Deleted

## 2015-09-06 ENCOUNTER — Telehealth: Payer: Self-pay | Admitting: Family Medicine

## 2015-09-06 NOTE — Telephone Encounter (Signed)
Patient returned Shapale's call.  Please call her back at work and ask to speak to her.

## 2015-09-06 NOTE — Telephone Encounter (Signed)
Pt said that at her CPE Dr. Glori Bickers and her discussed a diet plan and Dr. Glori Bickers said she would print something out for her to help with healthy eating, pt not sure of the name of the handout ?DASH diet, pt said she didn't get it in her check out papers so she is requesting Dr. Glori Bickers to reprint the handout and request that I fax it to her secured work fax  Fax # [phone]

## 2015-09-06 NOTE — Telephone Encounter (Signed)
Addressed through result notes  

## 2015-09-06 NOTE — Telephone Encounter (Signed)
Sorry about that- printed and in IN box

## 2015-09-06 NOTE — Telephone Encounter (Signed)
Handout faxed to pt

## 2015-09-09 ENCOUNTER — Encounter: Payer: Self-pay | Admitting: *Deleted

## 2015-10-15 ENCOUNTER — Other Ambulatory Visit: Payer: Self-pay | Admitting: Family Medicine

## 2015-10-16 ENCOUNTER — Other Ambulatory Visit: Payer: Self-pay | Admitting: Family Medicine

## 2015-11-12 ENCOUNTER — Other Ambulatory Visit: Payer: Self-pay | Admitting: Family Medicine

## 2015-11-12 MED ORDER — ZOLPIDEM TARTRATE 10 MG PO TABS: ORAL_TABLET | ORAL | Status: DC

## 2015-11-12 NOTE — Telephone Encounter (Signed)
Printed to fax  

## 2015-11-12 NOTE — Telephone Encounter (Signed)
Fax refill request, pt had CPE on 09/03/15, last refilled on 08/16/15 #45 tabs with 0 refills, if approved pt will need paper Rx so I can fax to mail order pharmacy

## 2015-11-12 NOTE — Telephone Encounter (Signed)
Rx faxed to [facility]

## 2016-01-29 LAB — HM MAMMOGRAPHY

## 2016-01-31 ENCOUNTER — Encounter: Payer: Self-pay | Admitting: Family Medicine

## 2016-02-04 ENCOUNTER — Encounter: Payer: Self-pay | Admitting: *Deleted

## 2016-02-18 ENCOUNTER — Other Ambulatory Visit: Payer: Self-pay | Admitting: *Deleted

## 2016-02-18 MED ORDER — ZOLPIDEM TARTRATE 10 MG PO TABS: ORAL_TABLET | ORAL | 0 refills | Status: DC

## 2016-02-18 NOTE — Telephone Encounter (Signed)
Printed in IN box to fax

## 2016-02-18 NOTE — Telephone Encounter (Signed)
Fax refill request pt has CPE on 09/03/15 last filled on 11/12/15 #45 with 0 refill

## 2016-02-19 NOTE — Telephone Encounter (Signed)
Rx faxed to [facility]

## 2016-04-26 ENCOUNTER — Other Ambulatory Visit: Payer: Self-pay | Admitting: Family Medicine

## 2016-04-27 NOTE — Telephone Encounter (Signed)
Please schedule spring PE and refill until then thanks

## 2016-04-27 NOTE — Telephone Encounter (Signed)
Last filled on 11/12/15 #90 with 1 additional refill, CPE was on 09/03/15, please advise

## 2016-04-28 NOTE — Telephone Encounter (Signed)
Left voicemail requesting pt to call the office back to schedule her CPE

## 2016-04-29 NOTE — Telephone Encounter (Signed)
Med refilled.

## 2016-04-29 NOTE — Telephone Encounter (Signed)
Patient returned Shapale's call.  Patient scheduled physical on 10/14/16.  Patient notified refill will be called in until appointment.

## 2016-05-19 ENCOUNTER — Other Ambulatory Visit: Payer: Self-pay | Admitting: *Deleted

## 2016-05-19 MED ORDER — ZOLPIDEM TARTRATE 10 MG PO TABS: ORAL_TABLET | ORAL | 0 refills | Status: DC

## 2016-05-19 NOTE — Telephone Encounter (Signed)
Rx faxed to pharmacy  

## 2016-05-19 NOTE — Telephone Encounter (Signed)
Printed in IN box to fax

## 2016-05-19 NOTE — Telephone Encounter (Signed)
Pt has CPE scheduled on 10/15/15, last filled on 02/18/16 #45 tabs with 0 refills, if approved please print so I can fax it to mail order

## 2016-08-17 ENCOUNTER — Other Ambulatory Visit: Payer: Self-pay

## 2016-08-17 MED ORDER — ZOLPIDEM TARTRATE 10 MG PO TABS
ORAL_TABLET | ORAL | 0 refills | Status: DC
Start: 2016-08-17 — End: 2016-11-18

## 2016-08-17 NOTE — Telephone Encounter (Signed)
Fax request from [facility] for zolpidem tartrate 10mg .  Last sent 05-19-16 #45 Last OV 09-03-15 Next OV 10-14-16

## 2016-08-17 NOTE — Telephone Encounter (Signed)
Printed to fax  

## 2016-08-18 NOTE — Telephone Encounter (Signed)
Rx faxed to [facility]

## 2016-08-31 ENCOUNTER — Other Ambulatory Visit: Payer: Self-pay | Admitting: Family Medicine

## 2016-10-12 ENCOUNTER — Other Ambulatory Visit: Payer: Self-pay | Admitting: Family Medicine

## 2016-10-14 ENCOUNTER — Encounter: Payer: Self-pay | Admitting: Family Medicine

## 2016-10-14 ENCOUNTER — Ambulatory Visit (INDEPENDENT_AMBULATORY_CARE_PROVIDER_SITE_OTHER): Payer: 59 | Admitting: Family Medicine

## 2016-10-14 ENCOUNTER — Encounter: Payer: Self-pay | Admitting: Radiology

## 2016-10-14 VITALS — BP 134/74 | HR 71 | Temp 98.5°F | Ht 65.0 in | Wt 146.0 lb

## 2016-10-14 DIAGNOSIS — R739 Hyperglycemia, unspecified: Secondary | ICD-10-CM

## 2016-10-14 DIAGNOSIS — I10 Essential (primary) hypertension: Secondary | ICD-10-CM | POA: Diagnosis not present

## 2016-10-14 DIAGNOSIS — Z0000 Encounter for general adult medical examination without abnormal findings: Secondary | ICD-10-CM | POA: Diagnosis not present

## 2016-10-14 DIAGNOSIS — E7800 Pure hypercholesterolemia, unspecified: Secondary | ICD-10-CM

## 2016-10-14 MED ORDER — SIMVASTATIN 40 MG PO TABS: 40.0000 mg | ORAL_TABLET | Freq: Every day | ORAL | 3 refills | Status: DC

## 2016-10-14 MED ORDER — SERTRALINE HCL 50 MG PO TABS: 50.0000 mg | ORAL_TABLET | Freq: Every day | ORAL | 3 refills | Status: DC

## 2016-10-14 MED ORDER — LOSARTAN POTASSIUM 50 MG PO TABS: 50.0000 mg | ORAL_TABLET | Freq: Every day | ORAL | 3 refills | Status: DC

## 2016-10-14 NOTE — Progress Notes (Signed)
Subjective:    Patient ID: Taylor Ewing, female    DOB: [birth year], 66 y.o.   MRN: [id number]  HPI Here for health maintenance exam and to review chronic medical problems    No complaints , doing well overall   Trying to take care of herself  Getting active again - exercising outdoors -likes to walk  Active job as well   Wt Readings from Last 3 Encounters:  10/14/16 146 lb (66.2 kg)  09/03/15 145 lb (65.8 kg)  09/19/14 147 lb 1.9 oz (66.7 kg)  overall stable  bmi 24.3  Hep C screening neg, HIV screening neg   Mammogram 7/17 neg Mother was diagnosed with breast cancer recently - she was tested and does not have the gene for it   (caught early and she is doing well)  Self breast exam -no lumps or changes   Pap/gyn care :2/17 normal with neg HPV screen  No new partners No symptoms  Menopausal   Zoster vaccine -she has thought about it -unsure if ins pays for it  Flu vaccine utd this season Tetanus vaccine 2/16  Colonoscopy/ screening :7/15 nl with 10 y recall   Skin care -has not been to the dermatologist lately- wants to set up a visit  Getting more moles with time Remote hx of basal cell  Is careful about sun protection and avoids burning   bp is stable today  No cp or palpitations or headaches or edema  No side effects to medicines  BP Readings from Last 3 Encounters:  10/14/16 134/74  09/03/15 130/72  09/19/14 (!) 148/80     Hx of hyperglycemia Lab Results  Component Value Date   HGBA1C 5.6 08/27/2015  due for lab  Diet is pretty good  Avoiding junk food most of the time  She tends to eat too many salty snack foods  Not a lot of sweets   Hx of hyperlipidemia Lab Results  Component Value Date   CHOL 159 08/27/2015   CHOL 148 02/12/2015   CHOL 158 08/06/2014   Lab Results  Component Value Date   HDL 42.40 08/27/2015   HDL 34.50 (L) 02/12/2015   HDL 39.50 08/06/2014   Lab Results  Component Value Date   LDLCALC 90 08/27/2015   LDLCALC 76  02/12/2015   Lab Results  Component Value Date   TRIG 133.0 08/27/2015   TRIG 191.0 (H) 02/12/2015   TRIG 209.0 (H) 08/06/2014   Lab Results  Component Value Date   CHOLHDL 4 08/27/2015   CHOLHDL 4 02/12/2015   CHOLHDL 4 08/06/2014   Lab Results  Component Value Date   LDLDIRECT 88.0 08/06/2014   LDLDIRECT 123.3 06/20/2013   LDLDIRECT 108.8 06/15/2012   simvastatin and diet  Due for labs today Diet not as good lately   Patient Active Problem List   Diagnosis Date Noted  . Screening for HIV (human immunodeficiency virus) 09/03/2015  . Need for hepatitis C screening test 09/03/2015  . Pain in joint, shoulder region 09/19/2014  . Hyperglycemia 08/13/2014  . Stress reaction, emotional 08/13/2014  . Routine general medical examination at a health care facility 06/15/2012  . Encounter for routine gynecological examination 06/15/2012  . Essential hypertension 02/10/2012  . Breast cancer screening 09/28/2011  . LOW BACK PAIN, CHRONIC 08/14/2008  . HYPERCHOLESTEROLEMIA 01/28/2007  . ANXIETY 01/28/2007  . ALLERGIC RHINITIS 01/28/2007  . Asthma 01/28/2007  . TMJ SYNDROME 01/28/2007  . MIGRAINES, HX OF 01/28/2007   Past Medical History:  Diagnosis Date  . Allergy   . Anxiety   . Asthma   . Cancer ([place])    skin, basal cell  . Essential hypertension   . Insomnia   . Kidney stones 03/02/2012  . Migraines   . TMJ (dislocation of temporomandibular joint)    Past Surgical History:  Procedure Laterality Date  . HEMORRHOID SURGERY  11/2003  . SKIN LESION EXCISION    . TUBAL LIGATION     Social History  Substance Use Topics  . Smoking status: Never Smoker  . Smokeless tobacco: Never Used  . Alcohol use 0.0 oz/week     Comment: 1-2 weekly   Family History  Problem Relation Age of Onset  . Depression Mother   . Depression Father   . Hyperlipidemia Sister   . Cancer Sister     thyroid  . Hyperlipidemia Brother   . Cancer Maternal Grandmother     breast  . Heart  attack Maternal Grandmother   . Nephrolithiasis Daughter   . Colon polyps Daughter   . Cancer Maternal Grandfather     stomach   No Known Allergies Current Outpatient Prescriptions on File Prior to Visit  Medication Sig Dispense Refill  . albuterol (PROAIR HFA) 108 (90 BASE) MCG/ACT inhaler INHALE TWO PUFFS UP TO  EVERY 4 HOURS AS NEEDED FOR WHEEZE. 1 each 11  . zolpidem (AMBIEN) 10 MG tablet TAKE ONE-HALF TABLET BY MOUTH AT BEDTIME AS NEEDED FOR SLEEP 45 tablet 0   No current facility-administered medications on file prior to visit.     Review of Systems Review of Systems  Constitutional: Negative for fever, appetite change, fatigue and unexpected weight change.  Eyes: Negative for pain and visual disturbance.  Respiratory: Negative for cough and shortness of breath.   Cardiovascular: Negative for cp or palpitations    Gastrointestinal: Negative for nausea, diarrhea and constipation.  Genitourinary: Negative for urgency and frequency.  Skin: Negative for pallor or rash   Neurological: Negative for weakness, light-headedness, numbness and headaches.  Hematological: Negative for adenopathy. Does not bruise/bleed easily.  Psychiatric/Behavioral: Negative for dysphoric mood. The patient is not nervous/anxious.         Objective:   Physical Exam  Constitutional: She appears well-developed and well-nourished. No distress.  Well appearing   HENT:  Head: Normocephalic and atraumatic.  Right Ear: External ear normal.  Left Ear: External ear normal.  Mouth/Throat: Oropharynx is clear and moist.  Eyes: Conjunctivae and EOM are normal. Pupils are equal, round, and reactive to light. No scleral icterus.  Neck: Normal range of motion. Neck supple. No JVD present. Carotid bruit is not present. No thyromegaly present.  Cardiovascular: Normal rate, regular rhythm, normal heart sounds and intact distal pulses.  Exam reveals no gallop.   Pulmonary/Chest: Effort normal and breath sounds normal.  No respiratory distress. She has no wheezes. She exhibits no tenderness.  Abdominal: Soft. Bowel sounds are normal. She exhibits no distension, no abdominal bruit and no mass. There is no tenderness.  Genitourinary: No breast swelling, tenderness, discharge or bleeding.  Genitourinary Comments: Breast exam: No mass, nodules, thickening, tenderness, bulging, retraction, inflamation, nipple discharge or skin changes noted.  No axillary or clavicular LA.      Musculoskeletal: Normal range of motion. She exhibits no edema or tenderness.  Lymphadenopathy:    She has no cervical adenopathy.  Neurological: She is alert. She has normal reflexes. No cranial nerve deficit. She exhibits normal muscle tone. Coordination normal.  Skin: Skin is warm  and dry. No rash noted. No erythema. No pallor.  Stable nevi and lentigines scattered  Psychiatric: She has a normal mood and affect.          Assessment & Plan:   Problem List Items Addressed This Visit      Cardiovascular and Mediastinum   Essential hypertension - Primary    bp in fair control at this time  BP Readings from Last 1 Encounters:  10/14/16 134/74   No changes needed Disc lifstyle change with low sodium diet and exercise   Labs ordered      Relevant Medications   simvastatin (ZOCOR) 40 MG tablet   losartan (COZAAR) 50 MG tablet     Other   HYPERCHOLESTEROLEMIA    Disc goals for lipids and reasons to control them Rev labs with pt (from past)  Rev low sat fat diet in detail Simvastatin and diet       Relevant Medications   simvastatin (ZOCOR) 40 MG tablet   losartan (COZAAR) 50 MG tablet   Hyperglycemia    A1C today  In past has been Stable and well controlled disc imp of low glycemic diet and wt loss to prevent DM2       Relevant Orders   Hemoglobin A1c (Completed)   Routine general medical examination at a health care facility    Reviewed health habits including diet and exercise and skin cancer  prevention Reviewed appropriate screening tests for age  Also reviewed health mt list, fam hx and immunization status , as well as social and family history   Labs ordered  She will look into coverage for a shingles vaccine She plans to f/u with dermatology for mole check       Relevant Orders   CBC with Differential/Platelet (Completed)   Comprehensive metabolic panel (Completed)   Lipid panel (Completed)   TSH (Completed)

## 2016-10-14 NOTE — Progress Notes (Signed)
Pre visit review using our clinic review tool, if applicable. No additional management support is needed unless otherwise documented below in the visit note. 

## 2016-10-14 NOTE — Patient Instructions (Addendum)
Call us in June to do a July referral for mammogram at the breast center since [facility] does not take your insurance now  Continue self breast exams  Start a regular exercise program   If you are interested in a shingles/zoster vaccine - call your insurance to check on coverage,( you should not get it within 1 month of other vaccines) , then call us for a prescription  for it to take to a pharmacy that gives the shot , or make a nurse visit to get it here depending on your coverage  Do think about a dermatology visit   Labs today

## 2016-10-15 ENCOUNTER — Encounter: Payer: Self-pay | Admitting: Family Medicine

## 2016-10-15 LAB — TSH: TSH: 1.13 u[IU]/mL (ref 0.35–4.50)

## 2016-10-15 LAB — COMPREHENSIVE METABOLIC PANEL
ALK PHOS: 76 U/L (ref 39–117)
ALT: 26 U/L (ref 0–35)
AST: 21 U/L (ref 0–37)
Albumin: 4.9 g/dL (ref 3.5–5.2)
BUN: 21 mg/dL (ref 6–23)
CHLORIDE: 106 meq/L (ref 96–112)
CO2: 28 mEq/L (ref 19–32)
Calcium: 9.7 mg/dL (ref 8.4–10.5)
Creatinine, Ser: 0.78 mg/dL (ref 0.40–1.20)
GFR: 79.1 mL/min (ref >60.00)
GLUCOSE: 83 mg/dL (ref 70–99)
POTASSIUM: 4.4 meq/L (ref 3.5–5.1)
SODIUM: 141 meq/L (ref 135–145)
TOTAL PROTEIN: 7.7 g/dL (ref 6.0–8.3)
Total Bilirubin: 0.3 mg/dL (ref 0.2–1.2)

## 2016-10-15 LAB — CBC WITH DIFFERENTIAL/PLATELET
BASOS PCT: 1.1 % (ref 0.0–3.0)
Basophils Absolute: 0.1 10*3/uL (ref 0.0–0.1)
EOS PCT: 1.9 % (ref 0.0–5.0)
Eosinophils Absolute: 0.2 10*3/uL (ref 0.0–0.7)
HCT: 40.7 % (ref 36.0–46.0)
Hemoglobin: 13.9 g/dL (ref 12.0–15.0)
LYMPHS ABS: 2.3 10*3/uL (ref 0.7–4.0)
Lymphocytes Relative: 27.9 % (ref 12.0–46.0)
MCHC: 34.1 g/dL (ref 30.0–36.0)
MCV: 89.9 fl (ref 78.0–100.0)
MONO ABS: 0.9 10*3/uL (ref 0.1–1.0)
MONOS PCT: 11.5 % (ref 3.0–12.0)
NEUTROS ABS: 4.7 10*3/uL (ref 1.4–7.7)
NEUTROS PCT: 57.6 % (ref 43.0–77.0)
PLATELETS: 319 10*3/uL (ref 150.0–400.0)
RBC: 4.53 Mil/uL (ref 3.87–5.11)
RDW: 13.1 % (ref 11.5–15.5)
WBC: 8.2 10*3/uL (ref 4.0–10.5)

## 2016-10-15 LAB — LIPID PANEL
Cholesterol: 183 mg/dL (ref 0–200)
HDL: 41.3 mg/dL (ref >39.00)
LDL CALC: 103 mg/dL — AB (ref 0–99)
NONHDL: 141.27
Total CHOL/HDL Ratio: 4
Triglycerides: 190 mg/dL — ABNORMAL HIGH (ref 0.0–149.0)
VLDL: 38 mg/dL (ref 0.0–40.0)

## 2016-10-15 LAB — HEMOGLOBIN A1C: Hgb A1c MFr Bld: 5.7 % (ref 4.6–6.5)

## 2016-10-15 NOTE — Assessment & Plan Note (Signed)
Reviewed health habits including diet and exercise and skin cancer prevention Reviewed appropriate screening tests for age  Also reviewed health mt list, fam hx and immunization status , as well as social and family history   Labs ordered  She will look into coverage for a shingles vaccine She plans to f/u with dermatology for mole check

## 2016-10-15 NOTE — Assessment & Plan Note (Addendum)
A1C today  In past has been Stable and well controlled disc imp of low glycemic diet and wt loss to prevent DM2

## 2016-10-15 NOTE — Assessment & Plan Note (Addendum)
bp in fair control at this time  BP Readings from Last 1 Encounters:  10/14/16 134/74   No changes needed Disc lifstyle change with low sodium diet and exercise   Labs ordered

## 2016-10-15 NOTE — Assessment & Plan Note (Signed)
Disc goals for lipids and reasons to control them Rev labs with pt (from past)  Rev low sat fat diet in detail Simvastatin and diet

## 2016-10-16 ENCOUNTER — Encounter: Payer: Self-pay | Admitting: *Deleted

## 2016-10-16 ENCOUNTER — Encounter: Payer: Self-pay | Admitting: Family Medicine

## 2016-11-10 ENCOUNTER — Other Ambulatory Visit: Payer: Self-pay | Admitting: Family Medicine

## 2016-11-10 DIAGNOSIS — Z1231 Encounter for screening mammogram for malignant neoplasm of breast: Secondary | ICD-10-CM

## 2016-11-18 ENCOUNTER — Other Ambulatory Visit: Payer: Self-pay | Admitting: *Deleted

## 2016-11-18 MED ORDER — ZOLPIDEM TARTRATE 10 MG PO TABS: ORAL_TABLET | ORAL | 0 refills | Status: DC

## 2016-11-18 NOTE — Telephone Encounter (Signed)
Printed to fax  

## 2016-11-18 NOTE — Telephone Encounter (Signed)
CPE was done on 10/14/16, last filled on 08/17/16 #45 tabs with 0 refills, I would need paper Rx to fax to mail order pharmacy if approved, please advise

## 2016-11-18 NOTE — Telephone Encounter (Signed)
Rx faxed to [facility]

## 2017-01-29 LAB — HM MAMMOGRAPHY

## 2017-02-01 ENCOUNTER — Encounter: Payer: Self-pay | Admitting: Family Medicine

## 2017-02-01 ENCOUNTER — Ambulatory Visit: Payer: 59

## 2017-02-02 ENCOUNTER — Encounter: Payer: Self-pay | Admitting: *Deleted

## 2017-02-16 ENCOUNTER — Other Ambulatory Visit: Payer: Self-pay | Admitting: *Deleted

## 2017-02-16 MED ORDER — ZOLPIDEM TARTRATE 10 MG PO TABS: ORAL_TABLET | ORAL | 0 refills | Status: DC

## 2017-02-16 NOTE — Telephone Encounter (Signed)
CPE on 10/14/16, last filled on 11/18/16 #45 tabs with 0 refills, if approved I would need a paper Rx to fax to pt's mail order pharmacy

## 2017-02-16 NOTE — Telephone Encounter (Signed)
Printed to fax  

## 2017-02-17 NOTE — Telephone Encounter (Signed)
Rx faxed

## 2017-05-14 ENCOUNTER — Other Ambulatory Visit: Payer: Self-pay | Admitting: *Deleted

## 2017-05-14 MED ORDER — ZOLPIDEM TARTRATE 10 MG PO TABS: ORAL_TABLET | ORAL | 0 refills | Status: DC

## 2017-05-14 NOTE — Telephone Encounter (Signed)
Rx faxed to mail order pharmacy. 

## 2017-05-14 NOTE — Telephone Encounter (Signed)
Received fax refill request, Dr. Glori Bickers out of the office this month. Pt had her CPE on 10/14/16, last filled 02/16/17 #45 tablets with 0 refills, pt uses mail order pharmacy so if approved I would need a paper Rx to fax to [facility]

## 2017-08-10 ENCOUNTER — Other Ambulatory Visit: Payer: Self-pay | Admitting: Family Medicine

## 2017-08-10 ENCOUNTER — Other Ambulatory Visit: Payer: Self-pay | Admitting: Internal Medicine

## 2017-08-11 NOTE — Telephone Encounter (Signed)
Will refill electronically  

## 2017-08-11 NOTE — Telephone Encounter (Signed)
Last filled 05-14-17 #45 Last OV 10-14-16 No Future OV

## 2017-11-10 ENCOUNTER — Other Ambulatory Visit: Payer: Self-pay | Admitting: Family Medicine

## 2017-11-10 NOTE — Telephone Encounter (Signed)
Copied from [place] [id number]. Topic: General - Other >> Nov 10, 2017  1:36 PM Oneta Rack wrote:  Relation to pt: self Call back number: [phone]  Pharmacy: [facility], [place] [phone] (Phone) [phone] (Fax)  Reason for call:  Patient requesting zolpidem (AMBIEN) 10 MG tablet, patient declined contacting pharmacy and will in the future, patient informed please allow 48 to 72 hour turn around, please advise

## 2017-11-11 MED ORDER — ZOLPIDEM TARTRATE 10 MG PO TABS: ORAL_TABLET | ORAL | 0 refills | Status: DC

## 2017-11-11 NOTE — Telephone Encounter (Signed)
LOV 10/14/16 Dr. Glori Bickers Last refill  08/11/17  # 45  0 refill

## 2017-11-11 NOTE — Telephone Encounter (Signed)
I refilled times one Please schedule f/u

## 2017-11-11 NOTE — Telephone Encounter (Signed)
Requesting refill zolpidem to [facility]. Last seen annual 10/14/16; no future appt scheduled; Last refill # 45 on 08/11/17.

## 2017-11-12 NOTE — Telephone Encounter (Signed)
Taylor Ewing will contact pt and get f/u scheduled

## 2017-11-19 ENCOUNTER — Ambulatory Visit: Payer: 59 | Admitting: Family Medicine

## 2017-11-19 ENCOUNTER — Encounter: Payer: Self-pay | Admitting: Family Medicine

## 2017-11-19 VITALS — BP 112/70 | HR 68 | Temp 97.9°F | Ht 65.0 in | Wt 139.5 lb

## 2017-11-19 DIAGNOSIS — R739 Hyperglycemia, unspecified: Secondary | ICD-10-CM | POA: Diagnosis not present

## 2017-11-19 DIAGNOSIS — F419 Anxiety disorder, unspecified: Secondary | ICD-10-CM

## 2017-11-19 DIAGNOSIS — I10 Essential (primary) hypertension: Secondary | ICD-10-CM | POA: Diagnosis not present

## 2017-11-19 DIAGNOSIS — E7800 Pure hypercholesterolemia, unspecified: Secondary | ICD-10-CM

## 2017-11-19 LAB — LIPID PANEL
CHOL/HDL RATIO: 3
Cholesterol: 147 mg/dL (ref 0–200)
HDL: 43.6 mg/dL (ref >39.00)
LDL CALC: 80 mg/dL (ref 0–99)
NonHDL: 103.34
TRIGLYCERIDES: 117 mg/dL (ref 0.0–149.0)
VLDL: 23.4 mg/dL (ref 0.0–40.0)

## 2017-11-19 LAB — COMPREHENSIVE METABOLIC PANEL
ALBUMIN: 4.8 g/dL (ref 3.5–5.2)
ALK PHOS: 80 U/L (ref 39–117)
ALT: 24 U/L (ref 0–35)
AST: 12 U/L (ref 0–37)
BUN: 16 mg/dL (ref 6–23)
CALCIUM: 9.7 mg/dL (ref 8.4–10.5)
CO2: 29 mEq/L (ref 19–32)
Chloride: 105 mEq/L (ref 96–112)
Creatinine, Ser: 0.75 mg/dL (ref 0.40–1.20)
GFR: 82.47 mL/min (ref >60.00)
Glucose, Bld: 101 mg/dL — ABNORMAL HIGH (ref 70–99)
POTASSIUM: 4.1 meq/L (ref 3.5–5.1)
SODIUM: 140 meq/L (ref 135–145)
TOTAL PROTEIN: 7.8 g/dL (ref 6.0–8.3)
Total Bilirubin: 0.4 mg/dL (ref 0.2–1.2)

## 2017-11-19 LAB — CBC WITH DIFFERENTIAL/PLATELET
BASOS PCT: 0.7 % (ref 0.0–3.0)
Basophils Absolute: 0 10*3/uL (ref 0.0–0.1)
EOS PCT: 1 % (ref 0.0–5.0)
Eosinophils Absolute: 0.1 10*3/uL (ref 0.0–0.7)
HEMATOCRIT: 41.3 % (ref 36.0–46.0)
HEMOGLOBIN: 14 g/dL (ref 12.0–15.0)
LYMPHS PCT: 32.5 % (ref 12.0–46.0)
Lymphs Abs: 1.8 10*3/uL (ref 0.7–4.0)
MCHC: 33.9 g/dL (ref 30.0–36.0)
MCV: 90.8 fl (ref 78.0–100.0)
MONOS PCT: 9.1 % (ref 3.0–12.0)
Monocytes Absolute: 0.5 10*3/uL (ref 0.1–1.0)
Neutro Abs: 3.2 10*3/uL (ref 1.4–7.7)
Neutrophils Relative %: 56.7 % (ref 43.0–77.0)
Platelets: 333 10*3/uL (ref 150.0–400.0)
RBC: 4.55 Mil/uL (ref 3.87–5.11)
RDW: 13.1 % (ref 11.5–15.5)
WBC: 5.7 10*3/uL (ref 4.0–10.5)

## 2017-11-19 LAB — HEMOGLOBIN A1C: Hgb A1c MFr Bld: 5.7 % (ref 4.6–6.5)

## 2017-11-19 LAB — TSH: TSH: 0.97 u[IU]/mL (ref 0.35–4.50)

## 2017-11-19 MED ORDER — LOSARTAN POTASSIUM 50 MG PO TABS: 50.0000 mg | ORAL_TABLET | Freq: Every day | ORAL | 3 refills | Status: DC

## 2017-11-19 MED ORDER — SERTRALINE HCL 50 MG PO TABS: 50.0000 mg | ORAL_TABLET | Freq: Every day | ORAL | 3 refills | Status: DC

## 2017-11-19 MED ORDER — SIMVASTATIN 40 MG PO TABS: 40.0000 mg | ORAL_TABLET | Freq: Every day | ORAL | 3 refills | Status: DC

## 2017-11-19 NOTE — Patient Instructions (Signed)
Labs today  congrats on retirement   Take care of yourself - diet/exercise/sleep

## 2017-11-19 NOTE — Assessment & Plan Note (Signed)
Disc goals for lipids and reasons to control them Rev last labs with pt Rev low sat fat diet in detail Lab today - diet is improved Continues zocor

## 2017-11-19 NOTE — Assessment & Plan Note (Signed)
Continues sertraline and good health habits Reviewed stressors/ coping techniques/symptoms/ support sources/ tx options and side effects in detail today  Job is a large source of stress-but retiring very soon  Expect this will help  Continues ambien for sleep

## 2017-11-19 NOTE — Assessment & Plan Note (Signed)
bp in fair control at this time  BP Readings from Last 1 Encounters:  11/19/17 112/70   No changes needed Most recent labs reviewed  Labs ordered  Disc lifstyle change with low sodium diet and exercise

## 2017-11-19 NOTE — Assessment & Plan Note (Signed)
Due for A1C disc imp of low glycemic diet and wt loss to prevent DM2  Pt has lost wt

## 2017-11-19 NOTE — Progress Notes (Signed)
Subjective:    Patient ID: Taylor Ewing, female    DOB: [birth year], 67 y.o.   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    Wt Readings from Last 3 Encounters:  11/19/17 139 lb 8 oz (63.3 kg)  10/14/16 146 lb (66.2 kg)  09/03/15 145 lb (65.8 kg)  lost some weight  Had dental surgery - ate less after that  Walking for exercise- wants to do more  Eating healthy  23.21 kg/m   bp is stable today  No cp or palpitations or headaches or edema  No side effects to medicines  BP Readings from Last 3 Encounters:  11/19/17 112/70  10/14/16 134/74  09/03/15 130/72      Due for annual labs   Hyperlipidemia Lab Results  Component Value Date   CHOL 183 10/14/2016   HDL 41.30 10/14/2016   LDLCALC 103 (H) 10/14/2016   LDLDIRECT 88.0 08/06/2014   TRIG 190.0 (H) 10/14/2016   CHOLHDL 4 10/14/2016   Simvastatin and diet  No missed doses Diet is good- is mindful of high fat foods    Hyperglycemia Lab Results  Component Value Date   HGBA1C 5.7 10/14/2016  she does not eat a lot of sweets  Some salty snack foods at times    Mood -h/o anxiety--- high now but it is work related /so she is retiring  On zoloft 50 mg  ambien for sleep   Patient Active Problem List   Diagnosis Date Noted  . Screening for HIV (human immunodeficiency virus) 09/03/2015  . Need for hepatitis C screening test 09/03/2015  . Hyperglycemia 08/13/2014  . Stress reaction, emotional 08/13/2014  . Routine general medical examination at a health care facility 06/15/2012  . Encounter for routine gynecological examination 06/15/2012  . Essential hypertension 02/10/2012  . Breast cancer screening 09/28/2011  . LOW BACK PAIN, CHRONIC 08/14/2008  . HYPERCHOLESTEROLEMIA 01/28/2007  . ANXIETY 01/28/2007  . ALLERGIC RHINITIS 01/28/2007  . Asthma 01/28/2007  . TMJ SYNDROME 01/28/2007  . MIGRAINES, HX OF 01/28/2007   Past Medical  History:  Diagnosis Date  . Allergy   . Anxiety   . Asthma   . Cancer ([place])    skin, basal cell  . Essential hypertension   . Insomnia   . Kidney stones 03/02/2012  . Migraines   . TMJ (dislocation of temporomandibular joint)    Past Surgical History:  Procedure Laterality Date  . HEMORRHOID SURGERY  11/2003  . SKIN LESION EXCISION    . TUBAL LIGATION     Social History   Tobacco Use  . Smoking status: Never Smoker  . Smokeless tobacco: Never Used  Substance Use Topics  . Alcohol use: Yes    Alcohol/week: 0.0 oz    Comment: 1-2 weekly  . Drug use: No   Family History  Problem Relation Age of Onset  . Depression Mother   . Breast cancer Mother   . Depression Father   . Hyperlipidemia Sister   . Cancer Sister        thyroid  . Hyperlipidemia Brother   . Cancer Maternal Grandmother        breast  . Heart attack Maternal Grandmother   . Nephrolithiasis Daughter   . Colon polyps Daughter   . Cancer Maternal Grandfather        stomach   No Known Allergies Current Outpatient Medications on File Prior to  Visit  Medication Sig Dispense Refill  . albuterol (PROAIR HFA) 108 (90 BASE) MCG/ACT inhaler INHALE TWO PUFFS UP TO  EVERY 4 HOURS AS NEEDED FOR WHEEZE. 1 each 11  . zolpidem (AMBIEN) 10 MG tablet Take 1/2 tablet by mouth at bedtime as needed for sleep 45 tablet 0   No current facility-administered medications on file prior to visit.      Review of Systems  Constitutional: Negative for activity change, appetite change, fatigue, fever and unexpected weight change.  HENT: Negative for congestion, ear pain, rhinorrhea, sinus pressure and sore throat.   Eyes: Negative for pain, redness and visual disturbance.  Respiratory: Negative for cough, shortness of breath and wheezing.   Cardiovascular: Negative for chest pain and palpitations.  Gastrointestinal: Negative for abdominal pain, blood in stool, constipation and diarrhea.  Endocrine: Negative for polydipsia and  polyuria.  Genitourinary: Negative for dysuria, frequency and urgency.  Musculoskeletal: Negative for arthralgias, back pain and myalgias.       Pain in R great toe-stubbed it on concrete yesterday  Skin: Negative for pallor and rash.  Allergic/Immunologic: Negative for environmental allergies.  Neurological: Negative for dizziness, syncope and headaches.  Hematological: Negative for adenopathy. Does not bruise/bleed easily.  Psychiatric/Behavioral: Negative for decreased concentration and dysphoric mood. The patient is nervous/anxious.        Stressors        Objective:   Physical Exam  Constitutional: She appears well-developed and well-nourished. No distress.  Well appearing   HENT:  Head: Normocephalic and atraumatic.  Mouth/Throat: Oropharynx is clear and moist.  Eyes: Pupils are equal, round, and reactive to light. Conjunctivae and EOM are normal.  Neck: Normal range of motion. Neck supple. No JVD present. Carotid bruit is not present. No thyromegaly present.  Cardiovascular: Normal rate, regular rhythm, normal heart sounds and intact distal pulses. Exam reveals no gallop.  Pulmonary/Chest: Effort normal and breath sounds normal. No respiratory distress. She has no wheezes. She has no rales.  No crackles  Abdominal: Soft. Bowel sounds are normal. She exhibits no distension, no abdominal bruit and no mass. There is no tenderness.  Musculoskeletal: She exhibits no edema.  R great toe- mildly tender with ecchymosis  Nl rom and perfusion  Lymphadenopathy:    She has no cervical adenopathy.  Neurological: She is alert. She has normal reflexes.  Skin: Skin is warm and dry. No rash noted.  Tanned   Ecchymosis over R great toe distally  Psychiatric: She has a normal mood and affect.          Assessment & Plan:   Problem List Items Addressed This Visit      Cardiovascular and Mediastinum   Essential hypertension - Primary    bp in fair control at this time  BP Readings  from Last 1 Encounters:  11/19/17 112/70   No changes needed Most recent labs reviewed  Labs ordered  Disc lifstyle change with low sodium diet and exercise        Relevant Medications   losartan (COZAAR) 50 MG tablet   simvastatin (ZOCOR) 40 MG tablet   Other Relevant Orders   CBC with Differential/Platelet   Comprehensive metabolic panel   Lipid panel   TSH     Other   Anxiety disorder    Continues sertraline and good health habits Reviewed stressors/ coping techniques/symptoms/ support sources/ tx options and side effects in detail today  Job is a large source of stress-but retiring very soon  Expect this will  help  Continues ambien for sleep      Relevant Medications   sertraline (ZOLOFT) 50 MG tablet   HYPERCHOLESTEROLEMIA    Disc goals for lipids and reasons to control them Rev last labs with pt Rev low sat fat diet in detail Lab today - diet is improved Continues zocor      Relevant Medications   losartan (COZAAR) 50 MG tablet   simvastatin (ZOCOR) 40 MG tablet   Other Relevant Orders   Lipid panel   Hyperglycemia    Due for A1C disc imp of low glycemic diet and wt loss to prevent DM2  Pt has lost wt       Relevant Orders   Hemoglobin A1c

## 2017-11-22 ENCOUNTER — Encounter: Payer: Self-pay | Admitting: *Deleted

## 2017-11-25 ENCOUNTER — Other Ambulatory Visit: Payer: Self-pay | Admitting: Family Medicine

## 2018-01-09 ENCOUNTER — Other Ambulatory Visit: Payer: Self-pay | Admitting: Family Medicine

## 2018-01-31 DIAGNOSIS — Z803 Family history of malignant neoplasm of breast: Secondary | ICD-10-CM | POA: Diagnosis not present

## 2018-01-31 DIAGNOSIS — Z1231 Encounter for screening mammogram for malignant neoplasm of breast: Secondary | ICD-10-CM | POA: Diagnosis not present

## 2018-02-13 ENCOUNTER — Other Ambulatory Visit: Payer: Self-pay | Admitting: Family Medicine

## 2018-02-14 NOTE — Telephone Encounter (Signed)
Name of Medication: Ambien Name of Pharmacy: [facility] on file Last Fill or Written Date and Quantity: 11/11/17 #45 tabs with 0 refills Last Office Visit and Type: 11/19/17 f/u appt Next Office Visit and Type: none scheduled Last Controlled Substance Agreement Date: 10/14/16 Last UDS: 10/14/16

## 2018-02-28 ENCOUNTER — Telehealth: Payer: Self-pay | Admitting: Family Medicine

## 2018-02-28 MED ORDER — LOSARTAN POTASSIUM 50 MG PO TABS: 50.0000 mg | ORAL_TABLET | Freq: Every day | ORAL | 1 refills | Status: DC

## 2018-02-28 MED ORDER — SIMVASTATIN 40 MG PO TABS: 40.0000 mg | ORAL_TABLET | Freq: Every day | ORAL | 1 refills | Status: DC

## 2018-02-28 NOTE — Telephone Encounter (Signed)
Copied from [place] [id number]. Topic: Quick Communication - Rx Refill/Question >> Feb 28, 2018  9:35 AM Burchel, Abbi R wrote: Medication: losartan (COZAAR) 50 MG tablet,  simvastatin (ZOCOR) 40 MG tablet   Preferred Pharmacy: [facility], [place] [address] Phone: [phone] Fax: [phone]  Pt was advised that RX refills may take up to 3 business days.

## 2018-03-18 DIAGNOSIS — L821 Other seborrheic keratosis: Secondary | ICD-10-CM | POA: Diagnosis not present

## 2018-05-10 ENCOUNTER — Other Ambulatory Visit: Payer: Self-pay | Admitting: Family Medicine

## 2018-05-11 NOTE — Telephone Encounter (Signed)
I have directions for 1/2 pill daily so I think this is early ?

## 2018-05-11 NOTE — Telephone Encounter (Signed)
Name of Medication: Ambien  Name of Pharmacy: [facility] or Written Date and Quantity: 02/14/18 #45 tabs with 0 refills Last Office Visit and Type: f/u on 11/19/17 Next Office Visit and Type: no future appts Last Controlled Substance Agreement Date: 10/14/16 Last UDS:10/14/16

## 2018-05-12 NOTE — Telephone Encounter (Signed)
Med will be due this weekend, will route back to Dr. Glori Bickers to fill on Friday

## 2018-08-07 ENCOUNTER — Other Ambulatory Visit: Payer: Self-pay | Admitting: Family Medicine

## 2018-08-09 DIAGNOSIS — M278 Other specified diseases of jaws: Secondary | ICD-10-CM | POA: Diagnosis not present

## 2018-08-09 DIAGNOSIS — J320 Chronic maxillary sinusitis: Secondary | ICD-10-CM | POA: Diagnosis not present

## 2018-08-09 DIAGNOSIS — M2669 Other specified disorders of temporomandibular joint: Secondary | ICD-10-CM | POA: Diagnosis not present

## 2018-08-09 NOTE — Telephone Encounter (Signed)
Name of Medication: Ambien  Name of Pharmacy: [facility] or Written Date and Quantity: 05/13/18 #45 tabs with 0 refills Last Office Visit and Type: f/u on 11/19/17 Next Office Visit and Type: no future appts Last Controlled Substance Agreement Date: 10/14/16 Last UDS:10/14/16

## 2018-09-27 ENCOUNTER — Other Ambulatory Visit: Payer: Self-pay | Admitting: Family Medicine

## 2018-09-27 NOTE — Telephone Encounter (Signed)
Patient called also and asked for refill on Albuterol inhaler. She has it on hand just incase. Uses it occasionally. No respiratory symptoms or distress at this time. Refill provided.

## 2018-11-01 ENCOUNTER — Other Ambulatory Visit: Payer: Self-pay | Admitting: Family Medicine

## 2018-11-02 NOTE — Telephone Encounter (Signed)
Last OV 11/19/2017   Last refilled 08/09/2018 disp 45 with no refills   Next OV none scheduled   Sent to PCP for approval

## 2018-11-16 ENCOUNTER — Telehealth: Payer: Self-pay | Admitting: Family Medicine

## 2018-11-16 NOTE — Telephone Encounter (Signed)
I left a message on patient's voice mail to return my call.  Patient needs to schedule a virtual follow up with Dr.Tower in May.

## 2018-11-17 NOTE — Telephone Encounter (Signed)
Appointment 5/15 °

## 2018-11-18 ENCOUNTER — Encounter: Payer: Self-pay | Admitting: Family Medicine

## 2018-11-18 ENCOUNTER — Ambulatory Visit (INDEPENDENT_AMBULATORY_CARE_PROVIDER_SITE_OTHER): Payer: PPO | Admitting: Family Medicine

## 2018-11-18 VITALS — Wt 140.0 lb

## 2018-11-18 DIAGNOSIS — I10 Essential (primary) hypertension: Secondary | ICD-10-CM | POA: Diagnosis not present

## 2018-11-18 DIAGNOSIS — F419 Anxiety disorder, unspecified: Secondary | ICD-10-CM

## 2018-11-18 DIAGNOSIS — R7303 Prediabetes: Secondary | ICD-10-CM | POA: Diagnosis not present

## 2018-11-18 DIAGNOSIS — E7800 Pure hypercholesterolemia, unspecified: Secondary | ICD-10-CM | POA: Diagnosis not present

## 2018-11-18 MED ORDER — LOSARTAN POTASSIUM 50 MG PO TABS: 50.0000 mg | ORAL_TABLET | Freq: Every day | ORAL | 3 refills | Status: DC

## 2018-11-18 MED ORDER — SIMVASTATIN 40 MG PO TABS: 40.0000 mg | ORAL_TABLET | Freq: Every day | ORAL | 1 refills | Status: DC

## 2018-11-18 MED ORDER — SERTRALINE HCL 50 MG PO TABS: 50.0000 mg | ORAL_TABLET | Freq: Every day | ORAL | 3 refills | Status: DC

## 2018-11-18 NOTE — Assessment & Plan Note (Signed)
More anxious with the pandemic situation  Also a bit down (from having to quarantine)- understandable Pt does not want to see counseling or inc medicine at this time Reviewed stressors/ coping techniques/symptoms/ support sources/ tx options and side effects in detail today Good self care-strongly enc to keep this up (especially exercise)  Will continue to follow and refill sertraline

## 2018-11-18 NOTE — Progress Notes (Signed)
Virtual Visit via Video Note  I connected with Taylor Ewing on 11/18/18 at 10:30 AM EDT by a video enabled telemedicine application and verified that I am speaking with the correct person using two identifiers.  Location: Patient: home Provider: office    I discussed the limitations of evaluation and management by telemedicine and the availability of in person appointments. The patient expressed understanding and agreed to proceed.  History of Present Illness: Pt presents for f/u of chronic health problems   Doing well overall  More allergies   Has been retired for a year  Has enjoyed it    [facility] Readings from Last 3 Encounters:  11/19/17 139 lb 8 oz (63.3 kg)  10/14/16 146 lb (66.2 kg)  09/03/15 145 lb (65.8 kg)   Thinks she is about 140 lb Exercise- walks 5 times per week   2/1/2 mi/ 45 minutes   HTN No cp or palpitations or headaches or edema  No side effects to medicines  BP Readings from Last 3 Encounters:  11/19/17 112/70  10/14/16 134/74  09/03/15 130/72   has not checked bp  Does not high or low  Has increased her water intake (helpful) - has flavored water Laverle Hobby it    Labs a year ago Lab Results  Component Value Date   CREATININE 0.75 11/19/2017   BUN 16 11/19/2017   NA 140 11/19/2017   K 4.1 11/19/2017   CL 105 11/19/2017   CO2 29 11/19/2017   Lab Results  Component Value Date   ALT 24 11/19/2017   AST 12 11/19/2017   ALKPHOS 80 11/19/2017   BILITOT 0.4 11/19/2017    Hyperlipidemia Lab Results  Component Value Date   CHOL 147 11/19/2017   HDL 43.60 11/19/2017   LDLCALC 80 11/19/2017   LDLDIRECT 88.0 08/06/2014   TRIG 117.0 11/19/2017   CHOLHDL 3 11/19/2017   Due for labs  Taking simvastatin 40 mg daily  Diet is good- tries to eat well  Too many salty snacks-mindful of it  No red meat No fried foods    Anxiety disorder Mood is ok/ the quarantine has been hard on her / little more down since she is cooped up  Wishes she could be  with family  Still takes care of grandson-that is helpful  Anxious about she or family getting sick as well  Other daughter/her family -not seen in 2 1/2 month  Pt takes sertraline- does not want to go up on that right now  Does not want to see a counselor  Self care is good   insomina - assoc with above More fitful sleep  occ wakes up very early - not often  ambien for sleep is helpful   Mildly elevated glucose in the past Lab Results  Component Value Date   HGBA1C 5.7 11/19/2017   Due for labs    Review of Systems  Constitutional: Negative for chills, fever, malaise/fatigue and weight loss.  HENT: Negative for hearing loss and sore throat.   Eyes: Negative for blurred vision, discharge and redness.  Respiratory: Negative for cough and shortness of breath.   Cardiovascular: Negative for chest pain, palpitations and leg swelling.  Gastrointestinal: Negative for blood in stool, heartburn and nausea.  Musculoskeletal: Negative for joint pain and myalgias.  Skin: Negative for itching and rash.  Neurological: Negative for dizziness and headaches.  Psychiatric/Behavioral: Negative for depression and memory loss. The patient is nervous/anxious and has insomnia.     Patient Active Problem  List   Diagnosis Date Noted  . Screening for HIV (human immunodeficiency virus) 09/03/2015  . Need for hepatitis C screening test 09/03/2015  . Prediabetes 08/13/2014  . Stress reaction, emotional 08/13/2014  . Routine general medical examination at a health care facility 06/15/2012  . Encounter for routine gynecological examination 06/15/2012  . Essential hypertension 02/10/2012  . Breast cancer screening 09/28/2011  . LOW BACK PAIN, CHRONIC 08/14/2008  . HYPERCHOLESTEROLEMIA 01/28/2007  . Anxiety disorder 01/28/2007  . ALLERGIC RHINITIS 01/28/2007  . Asthma 01/28/2007  . TMJ SYNDROME 01/28/2007  . MIGRAINES, HX OF 01/28/2007   Past Medical History:  Diagnosis Date  . Allergy   .  Anxiety   . Asthma   . Cancer ([place])    skin, basal cell  . Essential hypertension   . Insomnia   . Kidney stones 03/02/2012  . Migraines   . TMJ (dislocation of temporomandibular joint)    Past Surgical History:  Procedure Laterality Date  . HEMORRHOID SURGERY  11/2003  . SKIN LESION EXCISION    . TUBAL LIGATION     Social History   Tobacco Use  . Smoking status: Never Smoker  . Smokeless tobacco: Never Used  Substance Use Topics  . Alcohol use: Yes    Alcohol/week: 0.0 standard drinks    Comment: 1-2 weekly  . Drug use: No   Family History  Problem Relation Age of Onset  . Depression Mother   . Breast cancer Mother   . Depression Father   . Hyperlipidemia Sister   . Cancer Sister        thyroid  . Hyperlipidemia Brother   . Cancer Maternal Grandmother        breast  . Heart attack Maternal Grandmother   . Nephrolithiasis Daughter   . Colon polyps Daughter   . Cancer Maternal Grandfather        stomach   No Known Allergies Current Outpatient Medications on File Prior to Visit  Medication Sig Dispense Refill  . albuterol (PROVENTIL HFA;VENTOLIN HFA) 108 (90 Base) MCG/ACT inhaler INHALE TWO PUFFS BY MOUTH EVERY 4 HOURS AS NEEDED FOR  WHEEZE 9 g 1  . zolpidem (AMBIEN) 10 MG tablet TAKE 1/2 (ONE-HALF) TABLET BY MOUTH AT BEDTIME AS NEEDED FOR SLEEP 45 tablet 0   No current facility-administered medications on file prior to visit.     Observations/Objective: Patient appears well, in no distress Weight is baseline  No facial swelling or asymmetry Normal voice-not hoarse and no slurred speech No obvious tremor or mobility impairment Moving neck and UEs normally Able to hear the call well  No cough or shortness of breath during interview  Talkative and mentally sharp with no cognitive changes No skin changes on face or neck , no rash or pallor Affect is normal (not tearful or overly anxious appearing)   Assessment and Plan: Problem List Items Addressed This  Visit      Cardiovascular and Mediastinum   Essential hypertension - Primary    bp in fair control at this time  Will arrange a drive through bp check with labs when able  BP Readings from Last 1 Encounters:  11/19/17 112/70   No changes needed Most recent labs reviewed  Disc lifstyle change with low sodium diet and exercise        Relevant Medications   losartan (COZAAR) 50 MG tablet   simvastatin (ZOCOR) 40 MG tablet     Other   HYPERCHOLESTEROLEMIA    Disc goals for  lipids and reasons to control them Rev last labs with pt Rev low sat fat diet in detail Due for labs-will set that up  Continues simvastatin and good diet      Relevant Medications   losartan (COZAAR) 50 MG tablet   simvastatin (ZOCOR) 40 MG tablet   Anxiety disorder    More anxious with the pandemic situation  Also a bit down (from having to quarantine)- understandable Pt does not want to see counseling or inc medicine at this time Reviewed stressors/ coping techniques/symptoms/ support sources/ tx options and side effects in detail today Good self care-strongly enc to keep this up (especially exercise)  Will continue to follow and refill sertraline      Relevant Medications   sertraline (ZOLOFT) 50 MG tablet   Prediabetes    Eating well and exercising Due for labs  Will set that up  disc imp of low glycemic diet and wt loss to prevent DM2           Follow Up Instructions: A staff member from the office will call you to set up labs from the car and a blood pressure check Keep up the exercise and good self care  If you feel more anxious or depressed please let me know and we can arrange counseling or increase your sertraline dose   Let me know if you need anything   I discussed the assessment and treatment plan with the patient. The patient was provided an opportunity to ask questions and all were answered. The patient agreed with the plan and demonstrated an understanding of the  instructions.   The patient was advised to call back or seek an in-person evaluation if the symptoms worsen or if the condition fails to improve as anticipated.     Loura Pardon, MD

## 2018-11-18 NOTE — Assessment & Plan Note (Signed)
Disc goals for lipids and reasons to control them Rev last labs with pt Rev low sat fat diet in detail Due for labs-will set that up  Continues simvastatin and good diet

## 2018-11-18 NOTE — Assessment & Plan Note (Signed)
Eating well and exercising Due for labs  Will set that up  disc imp of low glycemic diet and wt loss to prevent DM2

## 2018-11-18 NOTE — Assessment & Plan Note (Signed)
bp in fair control at this time  Will arrange a drive through bp check with labs when able  BP Readings from Last 1 Encounters:  11/19/17 112/70   No changes needed Most recent labs reviewed  Disc lifstyle change with low sodium diet and exercise

## 2018-11-18 NOTE — Patient Instructions (Signed)
A staff member from the office will call you to set up labs from the car and a blood pressure check Keep up the exercise and good self care  If you feel more anxious or depressed please let me know and we can arrange counseling or increase your sertraline dose   Let me know if you need anything

## 2018-11-29 ENCOUNTER — Ambulatory Visit: Payer: PPO

## 2018-11-29 ENCOUNTER — Other Ambulatory Visit (INDEPENDENT_AMBULATORY_CARE_PROVIDER_SITE_OTHER): Payer: PPO

## 2018-11-29 ENCOUNTER — Other Ambulatory Visit: Payer: Self-pay

## 2018-11-29 DIAGNOSIS — R7303 Prediabetes: Secondary | ICD-10-CM | POA: Diagnosis not present

## 2018-11-29 DIAGNOSIS — I10 Essential (primary) hypertension: Secondary | ICD-10-CM

## 2018-11-29 DIAGNOSIS — E7800 Pure hypercholesterolemia, unspecified: Secondary | ICD-10-CM

## 2018-11-29 LAB — COMPREHENSIVE METABOLIC PANEL
ALT: 23 U/L (ref 0–35)
AST: 20 U/L (ref 0–37)
Albumin: 4.4 g/dL (ref 3.5–5.2)
Alkaline Phosphatase: 90 U/L (ref 39–117)
BUN: 14 mg/dL (ref 6–23)
CO2: 28 mEq/L (ref 19–32)
Calcium: 9.3 mg/dL (ref 8.4–10.5)
Chloride: 105 mEq/L (ref 96–112)
Creatinine, Ser: 0.72 mg/dL (ref 0.40–1.20)
GFR: 81.08 mL/min (ref >60.00)
Glucose, Bld: 102 mg/dL — ABNORMAL HIGH (ref 70–99)
Potassium: 4 mEq/L (ref 3.5–5.1)
Sodium: 141 mEq/L (ref 135–145)
Total Bilirubin: 0.5 mg/dL (ref 0.2–1.2)
Total Protein: 6.9 g/dL (ref 6.0–8.3)

## 2018-11-29 LAB — CBC WITH DIFFERENTIAL/PLATELET
Basophils Absolute: 0 10*3/uL (ref 0.0–0.1)
Basophils Relative: 0.5 % (ref 0.0–3.0)
Eosinophils Absolute: 0.1 10*3/uL (ref 0.0–0.7)
Eosinophils Relative: 2.8 % (ref 0.0–5.0)
HCT: 40.2 % (ref 36.0–46.0)
Hemoglobin: 13.9 g/dL (ref 12.0–15.0)
Lymphocytes Relative: 34.1 % (ref 12.0–46.0)
Lymphs Abs: 1.5 10*3/uL (ref 0.7–4.0)
MCHC: 34.7 g/dL (ref 30.0–36.0)
MCV: 89.5 fl (ref 78.0–100.0)
Monocytes Absolute: 0.4 10*3/uL (ref 0.1–1.0)
Monocytes Relative: 10 % (ref 3.0–12.0)
Neutro Abs: 2.4 10*3/uL (ref 1.4–7.7)
Neutrophils Relative %: 52.6 % (ref 43.0–77.0)
Platelets: 257 10*3/uL (ref 150.0–400.0)
RBC: 4.49 Mil/uL (ref 3.87–5.11)
RDW: 13.1 % (ref 11.5–15.5)
WBC: 4.5 10*3/uL (ref 4.0–10.5)

## 2018-11-29 LAB — TSH: TSH: 1.95 u[IU]/mL (ref 0.35–4.50)

## 2018-11-29 LAB — HEMOGLOBIN A1C: Hgb A1c MFr Bld: 5.7 % (ref 4.6–6.5)

## 2018-11-29 LAB — LIPID PANEL
Cholesterol: 144 mg/dL (ref 0–200)
HDL: 34 mg/dL — ABNORMAL LOW (ref >39.00)
NonHDL: 109.65
Total CHOL/HDL Ratio: 4
Triglycerides: 214 mg/dL — ABNORMAL HIGH (ref 0.0–149.0)
VLDL: 42.8 mg/dL — ABNORMAL HIGH (ref 0.0–40.0)

## 2018-11-29 LAB — LDL CHOLESTEROL, DIRECT: Direct LDL: 71 mg/dL

## 2018-11-30 ENCOUNTER — Encounter: Payer: Self-pay | Admitting: *Deleted

## 2019-01-31 ENCOUNTER — Other Ambulatory Visit: Payer: Self-pay | Admitting: Family Medicine

## 2019-01-31 NOTE — Telephone Encounter (Signed)
Name of Medication: Ambien Name of Pharmacy: [facility]. Last Fill or Written Date and Quantity: 11/02/18 #45 tabs with 0 refills Last Office Visit and Type: f/u on 11/18/18 Next Office Visit and Type: none scheduled

## 2019-02-03 ENCOUNTER — Other Ambulatory Visit: Payer: Self-pay

## 2019-02-06 DIAGNOSIS — Z1231 Encounter for screening mammogram for malignant neoplasm of breast: Secondary | ICD-10-CM | POA: Diagnosis not present

## 2019-02-06 DIAGNOSIS — Z803 Family history of malignant neoplasm of breast: Secondary | ICD-10-CM | POA: Diagnosis not present

## 2019-03-15 ENCOUNTER — Telehealth: Payer: Self-pay

## 2019-03-15 NOTE — Telephone Encounter (Signed)
Pt last seen 11/18/18 for FU. Pt does not think the generic ambien 5 mg at hs is helping pt sleep now. Pt has difficulty going to sleep; pt may sleep 2-3 hours and then pt cannot go back to sleep.  Pt wanted to know if could increase dosage to taking ambien 10 mg at hs. Pt request cb. [place]. No future appt scheduled. Pt said the anxiety and depression are about the same; pt cannot go to sleep or stay a sleep.

## 2019-03-15 NOTE — Telephone Encounter (Signed)
Pt notified of Dr. Tower's instructions and verbalize understanding  

## 2019-03-15 NOTE — Telephone Encounter (Signed)
Go ahead and try 2 pills (dose of 10 mg) for the next 2 nights to see if it works and then let me know.  Use caution of sedation and falls.  Take it on an empty stomach (it works better) right at bedtime

## 2019-03-22 ENCOUNTER — Telehealth: Payer: Self-pay | Admitting: *Deleted

## 2019-03-22 MED ORDER — ZOLPIDEM TARTRATE 10 MG PO TABS: 10.0000 mg | ORAL_TABLET | Freq: Every evening | ORAL | 3 refills | Status: DC | PRN

## 2019-03-22 NOTE — Telephone Encounter (Signed)
I sent it  Use caution of sedation/falls   Her insurance may try to limit it due to age-we will have to see

## 2019-03-22 NOTE — Telephone Encounter (Signed)
Patient left a voicemail stating that she had called last week about increasing her Ambien. Patient stated that she increased her Ambien a couple of nights and it did help. Patient stated that she would like to change to the higher dose? Pharmacy- [facility]/[address]

## 2019-03-23 NOTE — Telephone Encounter (Signed)
Left VM letting pt know Rx sent to pharmacy and advised of Dr. Marliss Coots comments

## 2019-05-15 ENCOUNTER — Other Ambulatory Visit: Payer: Self-pay | Admitting: Family Medicine

## 2019-07-07 HISTORY — PX: HAND SURGERY: SHX662

## 2019-07-27 ENCOUNTER — Ambulatory Visit: Payer: PPO | Attending: Internal Medicine

## 2019-07-27 DIAGNOSIS — Z23 Encounter for immunization: Secondary | ICD-10-CM

## 2019-07-27 NOTE — Progress Notes (Signed)
   Covid-19 Vaccination Clinic  Name:  Taylor Ewing    MRN: [id number] DOB: 10/16/1950  07/27/2019  [name] was observed post Covid-19 immunization for 15 minutes without incidence. She was provided with Vaccine Information Sheet and instruction to access the V-Safe system.   [name] was instructed to call 911 with any severe reactions post vaccine: Marland Kitchen Difficulty breathing  . Swelling of your face and throat  . A fast heartbeat  . A bad rash all over your body  . Dizziness and weakness    Immunizations Administered    Name Date Dose VIS Date Route   Pfizer COVID-19 Vaccine 07/27/2019  2:03 PM 0.3 mL 06/16/2019 Intramuscular   Manufacturer: [place]   Lot: [id number]   [place]: [id number]

## 2019-08-14 DIAGNOSIS — H52222 Regular astigmatism, left eye: Secondary | ICD-10-CM | POA: Diagnosis not present

## 2019-08-14 DIAGNOSIS — H2513 Age-related nuclear cataract, bilateral: Secondary | ICD-10-CM | POA: Diagnosis not present

## 2019-08-14 DIAGNOSIS — H5203 Hypermetropia, bilateral: Secondary | ICD-10-CM | POA: Diagnosis not present

## 2019-08-14 DIAGNOSIS — H524 Presbyopia: Secondary | ICD-10-CM | POA: Diagnosis not present

## 2019-08-17 ENCOUNTER — Ambulatory Visit: Payer: PPO | Attending: Internal Medicine

## 2019-08-17 DIAGNOSIS — Z23 Encounter for immunization: Secondary | ICD-10-CM | POA: Insufficient documentation

## 2019-08-17 NOTE — Progress Notes (Signed)
   Covid-19 Vaccination Clinic  Name:  Taylor Ewing    MRN: [id number] DOB: 11-22-1950  08/17/2019  Taylor Ewing was observed post Covid-19 immunization for 15 minutes without incidence. She was provided with Vaccine Information Sheet and instruction to access the V-Safe system.   Taylor Ewing was instructed to call 911 with any severe reactions post vaccine: Marland Kitchen Difficulty breathing  . Swelling of your face and throat  . A fast heartbeat  . A bad rash all over your body  . Dizziness and weakness    Immunizations Administered    Name Date Dose VIS Date Route   Pfizer COVID-19 Vaccine 08/17/2019 11:04 AM 0.3 mL 06/16/2019 Intramuscular   Manufacturer: [place]   Lot: [id number]   [place]: [id number]

## 2019-09-23 ENCOUNTER — Other Ambulatory Visit: Payer: Self-pay | Admitting: Family Medicine

## 2019-09-25 NOTE — Telephone Encounter (Signed)
Last filled 08-04-19 #30 Last OV 11-15-18 No Future OV [facility]

## 2019-11-06 ENCOUNTER — Telehealth: Payer: Self-pay | Admitting: Family Medicine

## 2019-11-06 DIAGNOSIS — K5730 Diverticulosis of large intestine without perforation or abscess without bleeding: Secondary | ICD-10-CM | POA: Diagnosis not present

## 2019-11-06 DIAGNOSIS — N200 Calculus of kidney: Secondary | ICD-10-CM | POA: Diagnosis not present

## 2019-11-06 DIAGNOSIS — I700 Atherosclerosis of aorta: Secondary | ICD-10-CM | POA: Diagnosis not present

## 2019-11-06 DIAGNOSIS — R310 Gross hematuria: Secondary | ICD-10-CM | POA: Diagnosis not present

## 2019-11-06 DIAGNOSIS — Z87442 Personal history of urinary calculi: Secondary | ICD-10-CM | POA: Diagnosis not present

## 2019-11-06 DIAGNOSIS — R1084 Generalized abdominal pain: Secondary | ICD-10-CM | POA: Diagnosis not present

## 2019-11-06 DIAGNOSIS — K449 Diaphragmatic hernia without obstruction or gangrene: Secondary | ICD-10-CM | POA: Diagnosis not present

## 2019-11-06 NOTE — Telephone Encounter (Signed)
I left a message on patient's voice mail to return my call.  Patient has a awv scheduled on 11/10/19. Patient needs to schedule labs and cpx.

## 2019-11-10 ENCOUNTER — Telehealth (INDEPENDENT_AMBULATORY_CARE_PROVIDER_SITE_OTHER): Payer: PPO | Admitting: Family Medicine

## 2019-11-10 ENCOUNTER — Ambulatory Visit (INDEPENDENT_AMBULATORY_CARE_PROVIDER_SITE_OTHER): Payer: PPO

## 2019-11-10 ENCOUNTER — Other Ambulatory Visit (INDEPENDENT_AMBULATORY_CARE_PROVIDER_SITE_OTHER): Payer: PPO

## 2019-11-10 VITALS — BP 150/94 | Wt 140.0 lb

## 2019-11-10 DIAGNOSIS — I10 Essential (primary) hypertension: Secondary | ICD-10-CM | POA: Diagnosis not present

## 2019-11-10 DIAGNOSIS — R7303 Prediabetes: Secondary | ICD-10-CM | POA: Diagnosis not present

## 2019-11-10 DIAGNOSIS — E7800 Pure hypercholesterolemia, unspecified: Secondary | ICD-10-CM | POA: Diagnosis not present

## 2019-11-10 DIAGNOSIS — Z0000 Encounter for general adult medical examination without abnormal findings: Secondary | ICD-10-CM | POA: Diagnosis not present

## 2019-11-10 LAB — LIPID PANEL
Cholesterol: 162 mg/dL (ref 0–200)
HDL: 41.3 mg/dL (ref >39.00)
LDL Cholesterol: 83 mg/dL (ref 0–99)
NonHDL: 120.87
Total CHOL/HDL Ratio: 4
Triglycerides: 191 mg/dL — ABNORMAL HIGH (ref 0.0–149.0)
VLDL: 38.2 mg/dL (ref 0.0–40.0)

## 2019-11-10 LAB — CBC WITH DIFFERENTIAL/PLATELET
Basophils Absolute: 0 10*3/uL (ref 0.0–0.1)
Basophils Relative: 0.7 % (ref 0.0–3.0)
Eosinophils Absolute: 0.2 10*3/uL (ref 0.0–0.7)
Eosinophils Relative: 2.9 % (ref 0.0–5.0)
HCT: 39.8 % (ref 36.0–46.0)
Hemoglobin: 13.8 g/dL (ref 12.0–15.0)
Lymphocytes Relative: 32.6 % (ref 12.0–46.0)
Lymphs Abs: 1.7 10*3/uL (ref 0.7–4.0)
MCHC: 34.6 g/dL (ref 30.0–36.0)
MCV: 89.2 fl (ref 78.0–100.0)
Monocytes Absolute: 0.5 10*3/uL (ref 0.1–1.0)
Monocytes Relative: 9.6 % (ref 3.0–12.0)
Neutro Abs: 2.9 10*3/uL (ref 1.4–7.7)
Neutrophils Relative %: 54.2 % (ref 43.0–77.0)
Platelets: 288 10*3/uL (ref 150.0–400.0)
RBC: 4.47 Mil/uL (ref 3.87–5.11)
RDW: 12.9 % (ref 11.5–15.5)
WBC: 5.3 10*3/uL (ref 4.0–10.5)

## 2019-11-10 LAB — COMPREHENSIVE METABOLIC PANEL
ALT: 19 U/L (ref 0–35)
AST: 20 U/L (ref 0–37)
Albumin: 4.8 g/dL (ref 3.5–5.2)
Alkaline Phosphatase: 101 U/L (ref 39–117)
BUN: 16 mg/dL (ref 6–23)
CO2: 29 mEq/L (ref 19–32)
Calcium: 9.6 mg/dL (ref 8.4–10.5)
Chloride: 107 mEq/L (ref 96–112)
Creatinine, Ser: 0.78 mg/dL (ref 0.40–1.20)
GFR: 73.71 mL/min (ref >60.00)
Glucose, Bld: 104 mg/dL — ABNORMAL HIGH (ref 70–99)
Potassium: 4 mEq/L (ref 3.5–5.1)
Sodium: 142 mEq/L (ref 135–145)
Total Bilirubin: 0.4 mg/dL (ref 0.2–1.2)
Total Protein: 7.2 g/dL (ref 6.0–8.3)

## 2019-11-10 LAB — HEMOGLOBIN A1C: Hgb A1c MFr Bld: 5.6 % (ref 4.6–6.5)

## 2019-11-10 LAB — TSH: TSH: 2.36 u[IU]/mL (ref 0.35–4.50)

## 2019-11-10 NOTE — Telephone Encounter (Signed)
Lab orders

## 2019-11-10 NOTE — Patient Instructions (Signed)
Taylor Ewing , Thank you for taking time to come for your Medicare Wellness Visit. I appreciate your ongoing commitment to your health goals. Please review the following plan we discussed and let me know if I can assist you in the future.   Screening recommendations/referrals: Colonoscopy: Up to date, completed 01/31/2014 Mammogram: Up to date, completed 02/06/2019 Bone Density: due Recommended yearly ophthalmology/optometry visit for glaucoma screening and checkup Recommended yearly dental visit for hygiene and checkup  Vaccinations: Influenza vaccine: Up to date, completed 06/06/2019 Pneumococcal vaccine: Up to date, completed 06/06/2019 Tdap vaccine: Up to date, completed 08/13/2014 Shingles vaccine: administered 06/06/2019  Advanced directives: Advance directive discussed with you today. Even though you declined this today please call our office should you change your mind and we can give you the proper paperwork for you to fill out.  Conditions/risks identified: hypertension, hypercholesterolemia  Next appointment: 11/13/2019 @ 9:30 am    Preventive Care 65 Years and Older, Female Preventive care refers to lifestyle choices and visits with your health care provider that can promote health and wellness. What does preventive care include?  A yearly physical exam. This is also called an annual well check.  Dental exams once or twice a year.  Routine eye exams. Ask your health care provider how often you should have your eyes checked.  Personal lifestyle choices, including:  Daily care of your teeth and gums.  Regular physical activity.  Eating a healthy diet.  Avoiding tobacco and drug use.  Limiting alcohol use.  Practicing safe sex.  Taking low-dose aspirin every day.  Taking vitamin and mineral supplements as recommended by your health care provider. What happens during an annual well check? The services and screenings done by your health care provider during your  annual well check will depend on your age, overall health, lifestyle risk factors, and family history of disease. Counseling  Your health care provider may ask you questions about your:  Alcohol use.  Tobacco use.  Drug use.  Emotional well-being.  Home and relationship well-being.  Sexual activity.  Eating habits.  History of falls.  Memory and ability to understand (cognition).  Work and work Statistician.  Reproductive health. Screening  You may have the following tests or measurements:  Height, weight, and BMI.  Blood pressure.  Lipid and cholesterol levels. These may be checked every 5 years, or more frequently if you are over 14 years old.  Skin check.  Lung cancer screening. You may have this screening every year starting at age 38 if you have a 30-pack-year history of smoking and currently smoke or have quit within the past 15 years.  Fecal occult blood test (FOBT) of the stool. You may have this test every year starting at age 60.  Flexible sigmoidoscopy or colonoscopy. You may have a sigmoidoscopy every 5 years or a colonoscopy every 10 years starting at age 7.  Hepatitis C blood test.  Hepatitis B blood test.  Sexually transmitted disease (STD) testing.  Diabetes screening. This is done by checking your blood sugar (glucose) after you have not eaten for a while (fasting). You may have this done every 1-3 years.  Bone density scan. This is done to screen for osteoporosis. You may have this done starting at age 72.  Mammogram. This may be done every 1-2 years. Talk to your health care provider about how often you should have regular mammograms. Talk with your health care provider about your test results, treatment options, and if necessary, the need for  more tests. Vaccines  Your health care provider may recommend certain vaccines, such as:  Influenza vaccine. This is recommended every year.  Tetanus, diphtheria, and acellular pertussis (Tdap, Td)  vaccine. You may need a Td booster every 10 years.  Zoster vaccine. You may need this after age 27.  Pneumococcal 13-valent conjugate (PCV13) vaccine. One dose is recommended after age 17.  Pneumococcal polysaccharide (PPSV23) vaccine. One dose is recommended after age 50. Talk to your health care provider about which screenings and vaccines you need and how often you need them. This information is not intended to replace advice given to you by your health care provider. Make sure you discuss any questions you have with your health care provider. Document Released: 07/19/2015 Document Revised: 03/11/2016 Document Reviewed: 04/23/2015 Elsevier Interactive Patient Education  2017 [place] Prevention in the Home Falls can cause injuries. They can happen to people of all ages. There are many things you can do to make your home safe and to help prevent falls. What can I do on the outside of my home?  Regularly fix the edges of walkways and driveways and fix any cracks.  Remove anything that might make you trip as you walk through a door, such as a raised step or threshold.  Trim any bushes or trees on the path to your home.  Use bright outdoor lighting.  Clear any walking paths of anything that might make someone trip, such as rocks or tools.  Regularly check to see if handrails are loose or broken. Make sure that both sides of any steps have handrails.  Any raised decks and porches should have guardrails on the edges.  Have any leaves, snow, or ice cleared regularly.  Use sand or salt on walking paths during winter.  Clean up any spills in your garage right away. This includes oil or grease spills. What can I do in the bathroom?  Use night lights.  Install grab bars by the toilet and in the tub and shower. Do not use towel bars as grab bars.  Use non-skid mats or decals in the tub or shower.  If you need to sit down in the shower, use a plastic, non-slip  stool.  Keep the floor dry. Clean up any water that spills on the floor as soon as it happens.  Remove soap buildup in the tub or shower regularly.  Attach bath mats securely with double-sided non-slip rug tape.  Do not have throw rugs and other things on the floor that can make you trip. What can I do in the bedroom?  Use night lights.  Make sure that you have a light by your bed that is easy to reach.  Do not use any sheets or blankets that are too big for your bed. They should not hang down onto the floor.  Have a firm chair that has side arms. You can use this for support while you get dressed.  Do not have throw rugs and other things on the floor that can make you trip. What can I do in the kitchen?  Clean up any spills right away.  Avoid walking on wet floors.  Keep items that you use a lot in easy-to-reach places.  If you need to reach something above you, use a strong step stool that has a grab bar.  Keep electrical cords out of the way.  Do not use floor polish or wax that makes floors slippery. If you must use wax, use non-skid  floor wax.  Do not have throw rugs and other things on the floor that can make you trip. What can I do with my stairs?  Do not leave any items on the stairs.  Make sure that there are handrails on both sides of the stairs and use them. Fix handrails that are broken or loose. Make sure that handrails are as long as the stairways.  Check any carpeting to make sure that it is firmly attached to the stairs. Fix any carpet that is loose or worn.  Avoid having throw rugs at the top or bottom of the stairs. If you do have throw rugs, attach them to the floor with carpet tape.  Make sure that you have a light switch at the top of the stairs and the bottom of the stairs. If you do not have them, ask someone to add them for you. What else can I do to help prevent falls?  Wear shoes that:  Do not have high heels.  Have rubber bottoms.  Are  comfortable and fit you well.  Are closed at the toe. Do not wear sandals.  If you use a stepladder:  Make sure that it is fully opened. Do not climb a closed stepladder.  Make sure that both sides of the stepladder are locked into place.  Ask someone to hold it for you, if possible.  Clearly mark and make sure that you can see:  Any grab bars or handrails.  First and last steps.  Where the edge of each step is.  Use tools that help you move around (mobility aids) if they are needed. These include:  Canes.  Walkers.  Scooters.  Crutches.  Turn on the lights when you go into a dark area. Replace any light bulbs as soon as they burn out.  Set up your furniture so you have a clear path. Avoid moving your furniture around.  If any of your floors are uneven, fix them.  If there are any pets around you, be aware of where they are.  Review your medicines with your doctor. Some medicines can make you feel dizzy. This can increase your chance of falling. Ask your doctor what other things that you can do to help prevent falls. This information is not intended to replace advice given to you by your health care provider. Make sure you discuss any questions you have with your health care provider. Document Released: 04/18/2009 Document Revised: 11/28/2015 Document Reviewed: 07/27/2014 Elsevier Interactive Patient Education  2017 [employer].

## 2019-11-10 NOTE — Progress Notes (Signed)
PCP notes:  Health Maintenance: Prevnar 13- due Dexa- due , wants to complete with her next mammogram   Abnormal Screenings: none   Patient concerns: Wants to discuss her elevated blood pressure issues and the potential effects due to taking Myrbetriq for urinary frequency.    Nurse concerns: none   Next PCP appt: 11/13/2019 @ 9:30 am

## 2019-11-10 NOTE — Progress Notes (Signed)
Subjective:   Taylor Ewing is a 69 y.o. female who presents for an Initial Medicare Annual Wellness Visit.  Review of Systems: N/A      This visit is being conducted through telemedicine via telephone at the nurse health advisor's home address due to the COVID-19 pandemic. This patient has given me verbal consent via doximity to conduct this visit, patient states they are participating from their home address. Patient and myself are on the telephone call. There is no referral for this visit. Some vital signs may be absent or patient reported.    Patient identification: identified by name, DOB, and current address    Cardiac Risk Factors include: advanced age (>20men, >35 women);hypertension;Other (see comment), Risk factor comments: hypercholesterolemia     Objective:    Today's Vitals   11/10/19 0855 11/10/19 0857  BP: (!) 150/94   Weight: 140 lb (63.5 kg)   PainSc:  6    Body mass index is 23.3 kg/m.  Advanced Directives 11/10/2019  Does Patient Have a Medical Advance Directive? No  Would patient like information on creating a medical advance directive? No - Patient declined    Current Medications (verified) Outpatient Encounter Medications as of 11/10/2019  Medication Sig  . albuterol (PROVENTIL HFA;VENTOLIN HFA) 108 (90 Base) MCG/ACT inhaler INHALE TWO PUFFS BY MOUTH EVERY 4 HOURS AS NEEDED FOR  WHEEZE  . losartan (COZAAR) 50 MG tablet Take 1 tablet (50 mg total) by mouth daily.  . sertraline (ZOLOFT) 50 MG tablet Take 1 tablet (50 mg total) by mouth daily.  . simvastatin (ZOCOR) 40 MG tablet Take 1 tablet by mouth once daily  . zolpidem (AMBIEN) 10 MG tablet TAKE 1 TABLET BY MOUTH AT BEDTIME AS NEEDED FOR SLEEP   No facility-administered encounter medications on file as of 11/10/2019.    Allergies (verified) Patient has no known allergies.   History: Past Medical History:  Diagnosis Date  . Allergy   . Anxiety   . Asthma   . Cancer ([place])    skin, basal cell    . Essential hypertension   . Insomnia   . Kidney stones 03/02/2012  . Migraines   . TMJ (dislocation of temporomandibular joint)    Past Surgical History:  Procedure Laterality Date  . HEMORRHOID SURGERY  11/2003  . SKIN LESION EXCISION    . TUBAL LIGATION     Family History  Problem Relation Age of Onset  . Depression Mother   . Breast cancer Mother   . Depression Father   . Hyperlipidemia Sister   . Cancer Sister        thyroid  . Hyperlipidemia Brother   . Cancer Maternal Grandmother        breast  . Heart attack Maternal Grandmother   . Nephrolithiasis Daughter   . Colon polyps Daughter   . Cancer Maternal Grandfather        stomach   Social History   Socioeconomic History  . Marital status: Married    Spouse name: Not on file  . Number of children: 2  . Years of education: Not on file  . Highest education level: Not on file  Occupational History  . Not on file  Tobacco Use  . Smoking status: Never Smoker  . Smokeless tobacco: Never Used  Substance and Sexual Activity  . Alcohol use: Yes    Alcohol/week: 0.0 standard drinks    Comment: 1-2 weekly  . Drug use: No  . Sexual activity: Yes  Other Topics Concern  . Not on file  Social History Narrative  . Not on file   Social Determinants of Health   Financial Resource Strain: Low Risk   . Difficulty of Paying Living Expenses: Not hard at all  Food Insecurity: No Food Insecurity  . Worried About Charity fundraiser in the Last Year: Never true  . Ran Out of Food in the Last Year: Never true  Transportation Needs: No Transportation Needs  . Lack of Transportation (Medical): No  . Lack of Transportation (Non-Medical): No  Physical Activity: Sufficiently Active  . Days of Exercise per Week: 5 days  . Minutes of Exercise per Session: 60 min  Stress: No Stress Concern Present  . Feeling of Stress : Only a little  Social Connections:   . Frequency of Communication with Friends and Family:   . Frequency  of Social Gatherings with Friends and Family:   . Attends Religious Services:   . Active Member of Clubs or Organizations:   . Attends Archivist Meetings:   Marland Kitchen Marital Status:     Tobacco Counseling Counseling given: Not Answered   Clinical Intake:  Pre-visit preparation completed: Yes  Pain : 0-10 Pain Score: 6  Pain Type: Acute pain Pain Location: Back Pain Orientation: Right Pain Descriptors / Indicators: Aching Pain Onset: 1 to 4 weeks ago Pain Frequency: Intermittent     Nutritional Risks: None Diabetes: No  How often do you need to have someone help you when you read instructions, pamphlets, or other written materials from your doctor or pharmacy?: 1 - Never What is the last grade level you completed in school?: bachelors  Interpreter Needed?: No  Information entered by :: CJohnson, LPN   Activities of Daily Living In your present state of health, do you have any difficulty performing the following activities: 11/10/2019  Hearing? N  Vision? N  Difficulty concentrating or making decisions? N  Walking or climbing stairs? N  Dressing or bathing? N  Doing errands, shopping? N  Preparing Food and eating ? N  Using the Toilet? N  In the past six months, have you accidently leaked urine? N  Do you have problems with loss of bowel control? N  Managing your Medications? N  Managing your Finances? N  Housekeeping or managing your Housekeeping? N  Some recent data might be hidden     Immunizations and Health Maintenance Immunization History  Administered Date(s) Administered  . Fluad Quad(high Dose 65+) 06/06/2019  . Influenza Split 06/15/2012  . Influenza, High Dose Seasonal PF 07/12/2018  . Influenza,inj,Quad PF,6+ Mos 08/13/2014, 09/03/2015  . PFIZER SARS-COV-2 Vaccination 07/27/2019, 08/17/2019  . Pneumococcal Conjugate-13 06/06/2019  . Td 07/30/2003  . Tdap 08/13/2014  . Zoster Recombinat (Shingrix) 06/06/2019   Health Maintenance Due    Topic Date Due  . DEXA SCAN  Never done    Patient Care Team: Tower, Wynelle Fanny, MD as PCP - General  Indicate any recent Medical Services you may have received from other than [facility] providers in the past year (date may be approximate).     Assessment:   This is a routine wellness examination for Taylor Ewing.  Hearing/Vision screen  Hearing Screening   125Hz  250Hz  500Hz  1000Hz  2000Hz  3000Hz  4000Hz  6000Hz  8000Hz   Right ear:           Left ear:           Vision Screening Comments: Patient gets annual eye exams   Dietary issues and exercise activities  discussed: Current Exercise Habits: Home exercise routine, Type of exercise: walking, Time (Minutes): 60, Frequency (Times/Week): 5, Weekly Exercise (Minutes/Week): 300, Intensity: Moderate, Exercise limited by: None identified  Goals    . Patient Stated     11/10/2019, I will continue to walk 4-5 days a week for 2-3 miles.       Depression Screen PHQ 2/9 Scores 11/10/2019 11/19/2017 06/15/2012  PHQ - 2 Score 0 0 0  PHQ- 9 Score 0 - -    Fall Risk Fall Risk  11/10/2019 02/03/2019  Falls in the past year? 0 0  Comment - Emmi Telephone Survey: data to providers prior to load  Number falls in past yr: 0 -  Injury with Fall? 0 -  Risk for fall due to : No Fall Risks -  Follow up Falls evaluation completed;Falls prevention discussed -    Is the patient's home free of loose throw rugs in walkways, pet beds, electrical cords, etc?   yes      Grab bars in the bathroom? no      Handrails on the stairs?   no      Adequate lighting?   yes  Timed Get Up and Go Performed: N/A  Cognitive Function: MMSE - Mini Mental State Exam 11/10/2019  Orientation to time 5  Orientation to Place 5  Registration 3  Attention/ Calculation 5  Recall 3  Language- repeat 1       Mini Cog  Mini-Cog screen was completed. Maximum score is 22. A value of 0 denotes this part of the MMSE was not completed or the patient failed this part of the Mini-Cog  screening.  Screening Tests Health Maintenance  Topic Date Due  . DEXA SCAN  Never done  . INFLUENZA VACCINE  02/04/2020  . MAMMOGRAM  02/06/2020  . PNA vac Low Risk Adult (2 of 2 - PPSV23) 06/05/2020  . COLONOSCOPY  02/01/2024  . TETANUS/TDAP  08/13/2024  . COVID-19 Vaccine  Completed  . Hepatitis C Screening  Completed    Qualifies for Shingles Vaccine: Yes   Cancer Screenings: Lung: Low Dose CT Chest recommended if Age 43-80 years, 30 pack-year currently smoking OR have quit w/in 15 years. Patient does not qualify. Breast: Up to date on Mammogram: Yes, completed 02/06/2019   Up to date of Bone Density/Dexa: No, wants to have this completed with her next mammogram.  Colorectal: completed 01/31/2014  Additional Screenings:  Hepatitis C Screening: 09/03/2015     Plan:   Patient will continue to walk 4-5 days a week for 2-3 miles.   I have personally reviewed and noted the following in the patient's chart:   . Medical and social history . Use of alcohol, tobacco or illicit drugs  . Current medications and supplements . Functional ability and status . Nutritional status . Physical activity . Advanced directives . List of other physicians . Hospitalizations, surgeries, and ER visits in previous 12 months . Vitals . Screenings to include cognitive, depression, and falls . Referrals and appointments  In addition, I have reviewed and discussed with patient certain preventive protocols, quality metrics, and best practice recommendations. A written personalized care plan for preventive services as well as general preventive health recommendations were provided to patient.     Andrez Grime, LPN   [id number]

## 2019-11-13 ENCOUNTER — Other Ambulatory Visit: Payer: Self-pay

## 2019-11-13 ENCOUNTER — Encounter: Payer: Self-pay | Admitting: Family Medicine

## 2019-11-13 ENCOUNTER — Ambulatory Visit (INDEPENDENT_AMBULATORY_CARE_PROVIDER_SITE_OTHER): Payer: PPO | Admitting: Family Medicine

## 2019-11-13 VITALS — BP 144/94 | HR 70 | Temp 97.6°F | Ht 64.5 in | Wt 140.4 lb

## 2019-11-13 DIAGNOSIS — R1031 Right lower quadrant pain: Secondary | ICD-10-CM | POA: Insufficient documentation

## 2019-11-13 DIAGNOSIS — Z0000 Encounter for general adult medical examination without abnormal findings: Secondary | ICD-10-CM

## 2019-11-13 DIAGNOSIS — R7303 Prediabetes: Secondary | ICD-10-CM

## 2019-11-13 DIAGNOSIS — Z23 Encounter for immunization: Secondary | ICD-10-CM | POA: Diagnosis not present

## 2019-11-13 DIAGNOSIS — I10 Essential (primary) hypertension: Secondary | ICD-10-CM | POA: Diagnosis not present

## 2019-11-13 DIAGNOSIS — E7800 Pure hypercholesterolemia, unspecified: Secondary | ICD-10-CM

## 2019-11-13 DIAGNOSIS — M6740 Ganglion, unspecified site: Secondary | ICD-10-CM | POA: Insufficient documentation

## 2019-11-13 MED ORDER — SERTRALINE HCL 50 MG PO TABS: 50.0000 mg | ORAL_TABLET | Freq: Every day | ORAL | 3 refills | Status: DC

## 2019-11-13 MED ORDER — LOSARTAN POTASSIUM 100 MG PO TABS: 100.0000 mg | ORAL_TABLET | Freq: Every day | ORAL | 3 refills | Status: DC

## 2019-11-13 MED ORDER — SIMVASTATIN 40 MG PO TABS: 40.0000 mg | ORAL_TABLET | Freq: Every day | ORAL | 3 refills | Status: DC

## 2019-11-13 NOTE — Assessment & Plan Note (Signed)
Positional  ? Possible hip OA If worse-will return to xray

## 2019-11-13 NOTE — Assessment & Plan Note (Signed)
Stable Lab Results  Component Value Date   HGBA1C 5.6 11/10/2019   disc imp of low glycemic diet and wt loss to prevent DM2  Good health habits

## 2019-11-13 NOTE — Assessment & Plan Note (Signed)
BP: (!) 144/94    Has been elevated here and at home despite good health habits  Today increased her losartan to 100 mg daily  F/u 1 mo  Enc to update if problems or side effects

## 2019-11-13 NOTE — Assessment & Plan Note (Signed)
Reviewed health habits including diet and exercise and skin cancer prevention Reviewed appropriate screening tests for age  Also reviewed health mt list, fam hx and immunization status , as well as social and family history   See HPI Labs reviewed  Pneumovax 23 given Had her covid vaccines  Mammogram due in aug-she will call for order for dexa closer to that)   No falls or fx  Rev amw today

## 2019-11-13 NOTE — Assessment & Plan Note (Signed)
Good control with simvastatin and diet Disc goals for lipids and reasons to control them Rev last labs with pt Rev low sat fat diet in detail HDL did go up with exercise-enc her to continue this

## 2019-11-13 NOTE — Progress Notes (Signed)
Subjective:    Patient ID: Taylor Ewing, female    DOB: May 29, 1951, 69 y.o.   MRN: [id number]  This visit occurred during the SARS-CoV-2 public health emergency.  Safety protocols were in place, including screening questions prior to the visit, additional usage of staff PPE, and extensive cleaning of exam room while observing appropriate contact time as indicated for disinfecting solutions.    HPI Here for health maintenance exam and to review chronic medical problems    Wt Readings from Last 3 Encounters:  11/13/19 140 lb 6 oz (63.7 kg)  11/10/19 140 lb (63.5 kg)  11/18/18 140 lb (63.5 kg)   23.72 kg/m   Doing well during retirement  Trying to take care of herself   Had amw on 5/7 Noted due for ppv23  (had prevnar 12/20)  Interested in dexa   Has not had a bone density test  Falls none  Fractures - none  Supplements - not taking Ca or D  Exercise -walks 2 mi at least 4 d per week   Mammogram 8/20 -[facility]  Self breast exam  Breast cancer in mother and GM  Colonoscopy 7/15   Had her covid vaccines 2/21   shingrix vaccines 12/20  HTN bp is stable today  No cp or palpitations or headaches or edema  No side effects to medicines  BP Readings from Last 3 Encounters:  11/13/19 (!) 144/94  11/10/19 (!) 150/94  11/19/17 112/70     Takes losartan  Her bp has been running high for a while  Was high at urology office and then at home 150s/100   Lab Results  Component Value Date   CREATININE 0.78 11/10/2019   BUN 16 11/10/2019   NA 142 11/10/2019   K 4.0 11/10/2019   CL 107 11/10/2019   CO2 29 11/10/2019   Lab Results  Component Value Date   ALT 19 11/10/2019   AST 20 11/10/2019   ALKPHOS 101 11/10/2019   BILITOT 0.4 11/10/2019    Lab Results  Component Value Date   WBC 5.3 11/10/2019   HGB 13.8 11/10/2019   HCT 39.8 11/10/2019   MCV 89.2 11/10/2019   PLT 288.0 11/10/2019    Lab Results  Component Value Date   TSH 2.36 11/10/2019       Hyperlipidemia  Lab Results  Component Value Date   CHOL 162 11/10/2019   CHOL 144 11/29/2018   CHOL 147 11/19/2017   Lab Results  Component Value Date   HDL 41.30 11/10/2019   HDL 34.00 (L) 11/29/2018   HDL 43.60 11/19/2017   Lab Results  Component Value Date   LDLCALC 83 11/10/2019   LDLCALC 80 11/19/2017   LDLCALC 103 (H) 10/14/2016   Lab Results  Component Value Date   TRIG 191.0 (H) 11/10/2019   TRIG 214.0 (H) 11/29/2018   TRIG 117.0 11/19/2017   Lab Results  Component Value Date   CHOLHDL 4 11/10/2019   CHOLHDL 4 11/29/2018   CHOLHDL 3 11/19/2017   Lab Results  Component Value Date   LDLDIRECT 71.0 11/29/2018   LDLDIRECT 88.0 08/06/2014   LDLDIRECT 123.3 06/20/2013   simvastatin and diet  Exercise has helped the HDL   prediabetes Glucose 104 fasting  Lab Results  Component Value Date   HGBA1C 5.6 11/10/2019  stable    Mood/history of anx and sleep disorder  Taking sertraline   Sees urology for kidney stones  Still occ has pink urine -following that   Ganglion  cyst at base of L thumb  Wants to have it checked out -eventually   Has groin pain R side with movement   Patient Active Problem List   Diagnosis Date Noted  . Ganglion cyst 11/13/2019  . Right groin pain 11/13/2019  . Screening for HIV (human immunodeficiency virus) 09/03/2015  . Need for hepatitis C screening test 09/03/2015  . Prediabetes 08/13/2014  . Stress reaction, emotional 08/13/2014  . Routine general medical examination at a health care facility 06/15/2012  . Encounter for routine gynecological examination 06/15/2012  . Essential hypertension 02/10/2012  . Breast cancer screening 09/28/2011  . LOW BACK PAIN, CHRONIC 08/14/2008  . HYPERCHOLESTEROLEMIA 01/28/2007  . Anxiety disorder 01/28/2007  . ALLERGIC RHINITIS 01/28/2007  . Asthma 01/28/2007  . TMJ SYNDROME 01/28/2007  . MIGRAINES, HX OF 01/28/2007   Past Medical History:  Diagnosis Date  . Allergy   .  Anxiety   . Asthma   . Cancer ([place])    skin, basal cell  . Essential hypertension   . Insomnia   . Kidney stones 03/02/2012  . Migraines   . TMJ (dislocation of temporomandibular joint)    Past Surgical History:  Procedure Laterality Date  . HEMORRHOID SURGERY  11/2003  . SKIN LESION EXCISION    . TUBAL LIGATION     Social History   Tobacco Use  . Smoking status: Never Smoker  . Smokeless tobacco: Never Used  Substance Use Topics  . Alcohol use: Yes    Alcohol/week: 0.0 standard drinks    Comment: 1-2 weekly  . Drug use: No   Family History  Problem Relation Age of Onset  . Depression Mother   . Breast cancer Mother   . Depression Father   . Hyperlipidemia Sister   . Cancer Sister        thyroid  . Hyperlipidemia Brother   . Cancer Maternal Grandmother        breast  . Heart attack Maternal Grandmother   . Nephrolithiasis Daughter   . Colon polyps Daughter   . Cancer Maternal Grandfather        stomach   No Known Allergies Current Outpatient Medications on File Prior to Visit  Medication Sig Dispense Refill  . albuterol (PROVENTIL HFA;VENTOLIN HFA) 108 (90 Base) MCG/ACT inhaler INHALE TWO PUFFS BY MOUTH EVERY 4 HOURS AS NEEDED FOR  WHEEZE 9 g 1  . Cetirizine HCl 10 MG TBDP Take 1 tablet by mouth daily.    Marland Kitchen zolpidem (AMBIEN) 10 MG tablet TAKE 1 TABLET BY MOUTH AT BEDTIME AS NEEDED FOR SLEEP 30 tablet 2   No current facility-administered medications on file prior to visit.    Review of Systems  Constitutional: Negative for activity change, appetite change, fatigue, fever and unexpected weight change.  HENT: Negative for congestion, ear pain, rhinorrhea, sinus pressure and sore throat.   Eyes: Negative for pain, redness and visual disturbance.  Respiratory: Negative for cough, shortness of breath and wheezing.   Cardiovascular: Negative for chest pain and palpitations.  Gastrointestinal: Negative for abdominal pain, blood in stool, constipation and diarrhea.   Endocrine: Negative for polydipsia and polyuria.  Genitourinary: Negative for dysuria, frequency and urgency.  Musculoskeletal: Negative for arthralgias, back pain and myalgias.  Skin: Negative for pallor and rash.  Allergic/Immunologic: Negative for environmental allergies.  Neurological: Negative for dizziness, syncope and headaches.  Hematological: Negative for adenopathy. Does not bruise/bleed easily.  Psychiatric/Behavioral: Negative for decreased concentration and dysphoric mood. The patient is not nervous/anxious.  Objective:   Physical Exam Constitutional:      General: She is not in acute distress.    Appearance: Normal appearance. She is well-developed and normal weight. She is not ill-appearing or diaphoretic.  HENT:     Head: Normocephalic and atraumatic.     Right Ear: Tympanic membrane, ear canal and external ear normal.     Left Ear: Tympanic membrane, ear canal and external ear normal.     Nose: Nose normal. No congestion.     Mouth/Throat:     Mouth: Mucous membranes are moist.     Pharynx: Oropharynx is clear. No posterior oropharyngeal erythema.  Eyes:     General: No scleral icterus.    Extraocular Movements: Extraocular movements intact.     Conjunctiva/sclera: Conjunctivae normal.     Pupils: Pupils are equal, round, and reactive to light.  Neck:     Thyroid: No thyromegaly.     Vascular: No carotid bruit or JVD.  Cardiovascular:     Rate and Rhythm: Normal rate and regular rhythm.     Pulses: Normal pulses.     Heart sounds: Normal heart sounds. No gallop.   Pulmonary:     Effort: Pulmonary effort is normal. No respiratory distress.     Breath sounds: Normal breath sounds. No wheezing.     Comments: Good air exch Chest:     Chest wall: No tenderness.  Abdominal:     General: Bowel sounds are normal. There is no distension or abdominal bruit.     Palpations: Abdomen is soft. There is no mass.     Tenderness: There is no abdominal tenderness.      Hernia: No hernia is present.  Genitourinary:    Comments: Breast exam: No mass, nodules, thickening, tenderness, bulging, retraction, inflamation, nipple discharge or skin changes noted.  No axillary or clavicular LA.     Musculoskeletal:        General: No tenderness. Normal range of motion.     Cervical back: Normal range of motion and neck supple. No rigidity. No muscular tenderness.     Right lower leg: No edema.     Left lower leg: No edema.  Lymphadenopathy:     Cervical: No cervical adenopathy.  Skin:    General: Skin is warm and dry.     Coloration: Skin is not pale.     Findings: No erythema or rash.     Comments: Solar lentigines diffusely    Neurological:     Mental Status: She is alert. Mental status is at baseline.     Cranial Nerves: No cranial nerve deficit.     Motor: No abnormal muscle tone.     Coordination: Coordination normal.     Gait: Gait normal.     Deep Tendon Reflexes: Reflexes are normal and symmetric. Reflexes normal.  Psychiatric:        Mood and Affect: Mood normal.        Cognition and Memory: Cognition and memory normal.           Assessment & Plan:   Problem List Items Addressed This Visit      Cardiovascular and Mediastinum   Essential hypertension    BP: (!) 144/94    Has been elevated here and at home despite good health habits  Today increased her losartan to 100 mg daily  F/u 1 mo  Enc to update if problems or side effects      Relevant Medications   losartan (COZAAR)  100 MG tablet   simvastatin (ZOCOR) 40 MG tablet     Other   HYPERCHOLESTEROLEMIA    Good control with simvastatin and diet Disc goals for lipids and reasons to control them Rev last labs with pt Rev low sat fat diet in detail HDL did go up with exercise-enc her to continue this      Relevant Medications   losartan (COZAAR) 100 MG tablet   simvastatin (ZOCOR) 40 MG tablet   Routine general medical examination at a health care facility - Primary     Reviewed health habits including diet and exercise and skin cancer prevention Reviewed appropriate screening tests for age  Also reviewed health mt list, fam hx and immunization status , as well as social and family history   See HPI Labs reviewed  Pneumovax 23 given Had her covid vaccines  Mammogram due in aug-she will call for order for dexa closer to that)   No falls or fx  Rev amw today       Prediabetes    Stable Lab Results  Component Value Date   HGBA1C 5.6 11/10/2019   disc imp of low glycemic diet and wt loss to prevent DM2  Good health habits       Other Visit Diagnoses    Need for 23-polyvalent pneumococcal polysaccharide vaccine       Relevant Orders   Pneumococcal polysaccharide vaccine 23-valent greater than or equal to 2yo subcutaneous/IM (Completed)

## 2019-11-13 NOTE — Assessment & Plan Note (Signed)
Base of R thumb dorsally  May req referral to hand specialist in the future

## 2019-11-13 NOTE — Patient Instructions (Addendum)
Call us about 3 weeks before mammogram to remind [place] to a referral for for your mammogram   Try to get 1200-1500 mg of calcium per day with at least 1000 iu of vitamin D - for bone health   Call us later this week for a hand specialist referral  We may want to get a right hip xray to look for arthritis   Pneumonia vaccine today   Go up on losartan to 100 mg daily

## 2019-11-20 ENCOUNTER — Telehealth: Payer: Self-pay

## 2019-11-20 DIAGNOSIS — M6740 Ganglion, unspecified site: Secondary | ICD-10-CM

## 2019-11-20 NOTE — Telephone Encounter (Signed)
Patient contacted the office and stated that she was seen on 5/10 for her CPE. She discussed with Dr. Glori Bickers about a ganglion cyst on her hand, and she states she would like a referral for this.  Patient states she prefers somewhere in [place].Please advise.

## 2019-11-20 NOTE — Telephone Encounter (Signed)
Referral done and routed to [facility]

## 2019-11-20 NOTE — Addendum Note (Signed)
Addended by: Loura Pardon A on: 11/20/2019 04:05 PM   Modules accepted: Orders

## 2019-11-21 DIAGNOSIS — R2232 Localized swelling, mass and lump, left upper limb: Secondary | ICD-10-CM | POA: Diagnosis not present

## 2019-12-01 ENCOUNTER — Other Ambulatory Visit: Payer: Self-pay

## 2019-12-01 DIAGNOSIS — D492 Neoplasm of unspecified behavior of bone, soft tissue, and skin: Secondary | ICD-10-CM | POA: Diagnosis not present

## 2019-12-01 DIAGNOSIS — D3612 Benign neoplasm of peripheral nerves and autonomic nervous system, upper limb, including shoulder: Secondary | ICD-10-CM | POA: Diagnosis not present

## 2019-12-12 DIAGNOSIS — D492 Neoplasm of unspecified behavior of bone, soft tissue, and skin: Secondary | ICD-10-CM | POA: Diagnosis not present

## 2020-01-19 ENCOUNTER — Telehealth: Payer: Self-pay

## 2020-01-19 DIAGNOSIS — E2839 Other primary ovarian failure: Secondary | ICD-10-CM

## 2020-01-19 NOTE — Telephone Encounter (Signed)
I placed the referral  (external since is it [facility])- sending to  [facility] Pt can call to schedule appt with her mammogram

## 2020-01-19 NOTE — Telephone Encounter (Signed)
Patient is having a mammogram done on 02/12/20 at [facility], and she would like to have a bone density scan done also. Dr. Glori Bickers, si this something we can place an order for so she can have this done at her mammo appt?

## 2020-01-19 NOTE — Telephone Encounter (Signed)
Called patient to inform that referral has been sent and its okay for her to schedule

## 2020-02-12 ENCOUNTER — Encounter: Payer: Self-pay | Admitting: Family Medicine

## 2020-02-12 DIAGNOSIS — Z780 Asymptomatic menopausal state: Secondary | ICD-10-CM | POA: Diagnosis not present

## 2020-02-12 DIAGNOSIS — Z1231 Encounter for screening mammogram for malignant neoplasm of breast: Secondary | ICD-10-CM | POA: Diagnosis not present

## 2020-02-12 DIAGNOSIS — M8589 Other specified disorders of bone density and structure, multiple sites: Secondary | ICD-10-CM | POA: Diagnosis not present

## 2020-02-20 ENCOUNTER — Encounter: Payer: Self-pay | Admitting: Family Medicine

## 2020-02-20 DIAGNOSIS — M8580 Other specified disorders of bone density and structure, unspecified site: Secondary | ICD-10-CM | POA: Insufficient documentation

## 2020-02-27 ENCOUNTER — Encounter: Payer: Self-pay | Admitting: Family Medicine

## 2020-03-03 ENCOUNTER — Other Ambulatory Visit: Payer: Self-pay | Admitting: Family Medicine

## 2020-03-04 NOTE — Telephone Encounter (Signed)
Name of Medication: Ambien Name of Pharmacy: [facility] or Written Date and Quantity: 09/25/19 #30 tabs with 2 additional refills Last Office Visit and Type: CPE on 11/13/19 Next Office Visit and Type: none scheduled

## 2020-03-22 ENCOUNTER — Other Ambulatory Visit: Payer: Self-pay

## 2020-03-22 ENCOUNTER — Ambulatory Visit (INDEPENDENT_AMBULATORY_CARE_PROVIDER_SITE_OTHER)
Admission: RE | Admit: 2020-03-22 | Discharge: 2020-03-22 | Disposition: A | Discharge status: Home / Self Care | Payer: PPO | Source: Ambulatory Visit | Attending: Family Medicine | Admitting: Family Medicine

## 2020-03-22 ENCOUNTER — Ambulatory Visit (INDEPENDENT_AMBULATORY_CARE_PROVIDER_SITE_OTHER): Payer: PPO | Admitting: Family Medicine

## 2020-03-22 ENCOUNTER — Encounter: Payer: Self-pay | Admitting: Family Medicine

## 2020-03-22 VITALS — BP 132/74 | HR 82 | Temp 97.6°F | Ht 64.5 in | Wt 140.0 lb

## 2020-03-22 DIAGNOSIS — R1031 Right lower quadrant pain: Secondary | ICD-10-CM | POA: Diagnosis not present

## 2020-03-22 DIAGNOSIS — R5382 Chronic fatigue, unspecified: Secondary | ICD-10-CM | POA: Diagnosis not present

## 2020-03-22 DIAGNOSIS — R5383 Other fatigue: Secondary | ICD-10-CM | POA: Insufficient documentation

## 2020-03-22 DIAGNOSIS — M1611 Unilateral primary osteoarthritis, right hip: Secondary | ICD-10-CM | POA: Diagnosis not present

## 2020-03-22 DIAGNOSIS — M25512 Pain in left shoulder: Secondary | ICD-10-CM

## 2020-03-22 DIAGNOSIS — R1030 Lower abdominal pain, unspecified: Secondary | ICD-10-CM

## 2020-03-22 NOTE — Progress Notes (Signed)
Subjective:    Patient ID: Taylor Ewing, female    DOB: 1950-10-09, 69 y.o.   MRN: [id number]  This visit occurred during the SARS-CoV-2 public health emergency.  Safety protocols were in place, including screening questions prior to the visit, additional usage of staff PPE, and extensive cleaning of exam room while observing appropriate contact time as indicated for disinfecting solutions.    HPI Pt presents with cramping /pelvic and arm pain   Wt Readings from Last 3 Encounters:  03/22/20 140 lb (63.5 kg)  11/13/19 140 lb 6 oz (63.7 kg)  11/10/19 140 lb (63.5 kg)   23.66 kg/m   She had hand surgery - schwannoma cut off in late may  Hand healed  She has some arthritis in that wrist  Now has some symptoms in her upper arm  Hurts in bicep to shoulder area  Searing pain - is positional  (not constant)  Worse to abduct arm or reach across body  Taking ibuprofen and acetaminophen     Prior to that she had kidney stone issue (late April/early may) Did CT-thought she passed a stone (saw Dr Louis Meckel)   Now ? GI issues Feels sort of nauseated  Feels crampy in pelvis  Radiates into back  Blood in urine at end of august  A little constipation -not bad  No vaginal bleeding or discharge   Feels out of sorts/light headed   Still some R groin pain with leg movement  She does still walk for exercise   No uti symptoms  Does not feel like a kidney stone   BP Readings from Last 3 Encounters:  03/22/20 132/74  11/13/19 (!) 144/94  11/10/19 (!) 150/94   Pulse Readings from Last 3 Encounters:  03/22/20 82  11/13/19 70  11/19/17 68          She had a pelvic US in 2010 US Transvaginal Non-Ob (Accession [id number]) (Order [id number]) Imaging Date: 07/27/2010 Department: [facility] Released By: (auto-released) Authorizing: Owens Loffler, MD  Exam Status  Status  Final [99]  PACS Intelerad Image Link  Show images for US  Transvaginal Non-Ob Study Result  Narrative  Clinical Data: 69 year old female with left pelvic pain. History  of tubal ligation.    TRANSABDOMINAL AND TRANSVAGINAL ULTRASOUND OF PELVIS    Technique: Both transabdominal and transvaginal ultrasound  examinations of the pelvis were performed including evaluation of  the uterus, ovaries, adnexal regions, and pelvic cul-de-sac.    Comparison: None    Findings: The uterus is anteverted measuring 6.9 x 3.5 x 3.3 cm.  No focal uterine lesions are identified.  Nabothian cysts are identified.  The endometrial stripe is homogeneous measuring 2 mm in greatest  diameter.  There are tiny echogenic foci along the endometrium probably  related to prior procedure or infection.    The ovaries bilaterally are normal.  There is no evidence of adnexal mass or free fluid.    IMPRESSION:  No evidence of acute abnormality.    Tiny echogenic foci along the endometrium - likely related to prior  procedure or infection.    Endometrial stripe - 2 mm.    Normal ovaries.    REF:G5 DICTATED: 08/15/2008 15:01:19   Provider: Lovina Reach   CT of abd/pelvis in 2013 showed some renal stones in L kidney    Constant stress- moderate  No change from the usual  Thinks no emotional changes   Patient Active Problem List   Diagnosis Date  Noted  . Left shoulder pain 03/22/2020  . Fatigue 03/22/2020  . Osteopenia 02/20/2020  . Estrogen deficiency 01/19/2020  . Ganglion cyst 11/13/2019  . Right groin pain 11/13/2019  . Screening for HIV (human immunodeficiency virus) 09/03/2015  . Need for hepatitis C screening test 09/03/2015  . Prediabetes 08/13/2014  . Stress reaction, emotional 08/13/2014  . Routine general medical examination at a health care facility 06/15/2012  . Encounter for routine gynecological examination 06/15/2012  . Essential hypertension 02/10/2012  . Breast cancer screening 09/28/2011  . LOW BACK PAIN, CHRONIC  08/14/2008  . Lower abdominal pain 08/14/2008  . HYPERCHOLESTEROLEMIA 01/28/2007  . Anxiety disorder 01/28/2007  . ALLERGIC RHINITIS 01/28/2007  . Asthma 01/28/2007  . TMJ SYNDROME 01/28/2007  . MIGRAINES, HX OF 01/28/2007   Past Medical History:  Diagnosis Date  . Allergy   . Anxiety   . Asthma   . Cancer ([place])    skin, basal cell  . Essential hypertension   . Insomnia   . Kidney stones 03/02/2012  . Migraines   . TMJ (dislocation of temporomandibular joint)    Past Surgical History:  Procedure Laterality Date  . HEMORRHOID SURGERY  11/2003  . SKIN LESION EXCISION    . TUBAL LIGATION     Social History   Tobacco Use  . Smoking status: Never Smoker  . Smokeless tobacco: Never Used  Substance Use Topics  . Alcohol use: Yes    Alcohol/week: 0.0 standard drinks    Comment: 1-2 weekly  . Drug use: No   Family History  Problem Relation Age of Onset  . Depression Mother   . Breast cancer Mother   . Depression Father   . Hyperlipidemia Sister   . Cancer Sister        thyroid  . Hyperlipidemia Brother   . Cancer Maternal Grandmother        breast  . Heart attack Maternal Grandmother   . Nephrolithiasis Daughter   . Colon polyps Daughter   . Cancer Maternal Grandfather        stomach   No Known Allergies Current Outpatient Medications on File Prior to Visit  Medication Sig Dispense Refill  . albuterol (PROVENTIL HFA;VENTOLIN HFA) 108 (90 Base) MCG/ACT inhaler INHALE TWO PUFFS BY MOUTH EVERY 4 HOURS AS NEEDED FOR  WHEEZE 9 g 1  . Cetirizine HCl 10 MG TBDP Take 1 tablet by mouth daily.    Marland Kitchen losartan (COZAAR) 100 MG tablet Take 1 tablet (100 mg total) by mouth daily. 90 tablet 3  . sertraline (ZOLOFT) 50 MG tablet Take 1 tablet (50 mg total) by mouth daily. 90 tablet 3  . simvastatin (ZOCOR) 40 MG tablet Take 1 tablet (40 mg total) by mouth daily. 90 tablet 3  . zolpidem (AMBIEN) 10 MG tablet TAKE 1 TABLET BY MOUTH AT BEDTIME AS NEEDED FOR SLEEP 30 tablet 1   No  current facility-administered medications on file prior to visit.    Review of Systems  Constitutional: Positive for fatigue. Negative for activity change, appetite change, fever and unexpected weight change.  HENT: Negative for congestion, ear pain, rhinorrhea, sinus pressure and sore throat.   Eyes: Negative for pain, redness and visual disturbance.  Respiratory: Negative for cough, shortness of breath and wheezing.   Cardiovascular: Negative for chest pain and palpitations.  Gastrointestinal: Positive for abdominal pain and nausea. Negative for abdominal distention, anal bleeding, blood in stool, constipation, diarrhea, rectal pain and vomiting.  Endocrine: Negative for polydipsia and polyuria.  Genitourinary: Negative for dysuria, frequency, hematuria, urgency, vaginal bleeding and vaginal discharge.  Musculoskeletal: Positive for arthralgias and back pain. Negative for gait problem, joint swelling and myalgias.  Skin: Negative for pallor and rash.  Allergic/Immunologic: Negative for environmental allergies.  Neurological: Negative for dizziness, syncope and headaches.  Hematological: Negative for adenopathy. Does not bruise/bleed easily.  Psychiatric/Behavioral: Positive for decreased concentration. Negative for dysphoric mood. The patient is not nervous/anxious.        Objective:   Physical Exam Constitutional:      General: She is not in acute distress.    Appearance: Normal appearance. She is well-developed and normal weight. She is not ill-appearing or diaphoretic.  HENT:     Head: Normocephalic and atraumatic.     Mouth/Throat:     Mouth: Mucous membranes are moist.  Eyes:     General: No scleral icterus.       Right eye: No discharge.        Left eye: No discharge.     Conjunctiva/sclera: Conjunctivae normal.     Pupils: Pupils are equal, round, and reactive to light.  Neck:     Thyroid: No thyromegaly.     Vascular: No carotid bruit or JVD.  Cardiovascular:     Rate  and Rhythm: Normal rate and regular rhythm.     Pulses: Normal pulses.     Heart sounds: Normal heart sounds. No gallop.   Pulmonary:     Effort: Pulmonary effort is normal. No respiratory distress.     Breath sounds: Normal breath sounds. No stridor. No wheezing, rhonchi or rales.  Chest:     Chest wall: No tenderness.  Abdominal:     General: Bowel sounds are normal. There is no distension or abdominal bruit.     Palpations: Abdomen is soft. There is no mass.     Tenderness: There is abdominal tenderness in the right lower quadrant, epigastric area and left lower quadrant. There is no right CVA tenderness, left CVA tenderness, guarding or rebound. Negative signs include Murphy's sign, Rovsing's sign, McBurney's sign, psoas sign and obturator sign.     Hernia: No hernia is present.     Comments: Very mild low abd tenderness (very vague) Worse pelvic pain to rotate hips Epigastric discomfort on deep palpation   Musculoskeletal:     Cervical back: Normal range of motion and neck supple. No rigidity or tenderness.     Right lower leg: No edema.     Left lower leg: No edema.     Comments: L hip- discomfort with ext rotation  R hip discomfort with int and ext rotation  Nl flex for both No hamstring pain   Lymphadenopathy:     Cervical: No cervical adenopathy.  Skin:    General: Skin is warm and dry.     Coloration: Skin is not jaundiced or pale.     Findings: No bruising, erythema or rash.  Neurological:     Mental Status: She is alert.     Sensory: No sensory deficit.     Motor: No weakness.     Coordination: Coordination normal.     Deep Tendon Reflexes: Reflexes are normal and symmetric. Reflexes normal.  Psychiatric:        Mood and Affect: Mood is anxious.        Cognition and Memory: Cognition and memory normal.     Comments: Mildly anxious and seemingly fatigued Very pleasant  Good historian  Assessment & Plan:   Problem List Items Addressed This Visit        Other   Lower abdominal pain - Primary    Unsure if this could be GI, gyn or MSK in origin or a combination  Some worse with hip rotation-xray of hips done today  Labs pending  Rev last CT (has had renal stones in the past and this feels different)  Had a more recent CT abd/pelvis-sent for this from urology Adv to stop oral nsaids as well due to GI issues F/u planned for pelvic exam and ua if needed next week and we will review films and labs        Relevant Orders   CBC with Differential/Platelet (Completed)   Comprehensive metabolic panel (Completed)   Right groin pain    Hip film today      Relevant Orders   DG Hip Unilat W OR W/O Pelvis 2-3 Views Right   Left shoulder pain    Suspect rotator cuff cause-most likely tendonitis  Advised use of ice Oral nsaid is likely worsening her GI complaints  Will try otc voltaren gel and continue tylenol Adv ice whenever possible  Passive rom exercises to prevent frozen shoulder-handout given  Xray today and more plan to follow rad rev      Relevant Orders   DG Shoulder Left   Fatigue    Fatigue/some malaise Labs today  Also dealing with shoulder pain and pelvic pain-unsure if related Is frustrated  Denies worsened depression but does have stressors      Relevant Orders   CBC with Differential/Platelet (Completed)   Comprehensive metabolic panel (Completed)   TSH (Completed)   VITAMIN D 25 Hydroxy (Vit-D Deficiency, Fractures) (Completed)   Vitamin B12 (Completed)

## 2020-03-22 NOTE — Patient Instructions (Addendum)
Stop the ibuprofen  Use ice  Tylenol is fine  Do the passive range of motion exercises  Xray now   Also xray of hips also   We will send for CT report   Labs today for fatigue   Follow up next week  I would like to do a pelvic exam and review results with you

## 2020-03-23 LAB — TSH: TSH: 0.95 mIU/L (ref 0.40–4.50)

## 2020-03-23 LAB — COMPREHENSIVE METABOLIC PANEL
AG Ratio: 2 (calc) (ref 1.0–2.5)
ALT: 24 U/L (ref 6–29)
AST: 23 U/L (ref 10–35)
Albumin: 5.1 g/dL (ref 3.6–5.1)
Alkaline phosphatase (APISO): 91 U/L (ref 37–153)
BUN: 18 mg/dL (ref 7–25)
CO2: 25 mmol/L (ref 20–32)
Calcium: 10.5 mg/dL — ABNORMAL HIGH (ref 8.6–10.4)
Chloride: 106 mmol/L (ref 98–110)
Creat: 0.78 mg/dL (ref 0.50–0.99)
Globulin: 2.5 g/dL (calc) (ref 1.9–3.7)
Glucose, Bld: 88 mg/dL (ref 65–99)
Potassium: 4.1 mmol/L (ref 3.5–5.3)
Sodium: 141 mmol/L (ref 135–146)
Total Bilirubin: 0.4 mg/dL (ref 0.2–1.2)
Total Protein: 7.6 g/dL (ref 6.1–8.1)

## 2020-03-23 LAB — VITAMIN D 25 HYDROXY (VIT D DEFICIENCY, FRACTURES): Vit D, 25-Hydroxy: 36 ng/mL (ref 30–100)

## 2020-03-23 LAB — CBC WITH DIFFERENTIAL/PLATELET
Absolute Monocytes: 866 cells/uL (ref 200–950)
Basophils Absolute: 53 cells/uL (ref 0–200)
Basophils Relative: 0.7 %
Eosinophils Absolute: 160 cells/uL (ref 15–500)
Eosinophils Relative: 2.1 %
HCT: 41.5 % (ref 35.0–45.0)
Hemoglobin: 14.1 g/dL (ref 11.7–15.5)
Lymphs Abs: 2128 cells/uL (ref 850–3900)
MCH: 30.1 pg (ref 27.0–33.0)
MCHC: 34 g/dL (ref 32.0–36.0)
MCV: 88.5 fL (ref 80.0–100.0)
MPV: 9.4 fL (ref 7.5–12.5)
Monocytes Relative: 11.4 %
Neutro Abs: 4393 cells/uL (ref 1500–7800)
Neutrophils Relative %: 57.8 %
Platelets: 342 10*3/uL (ref 140–400)
RBC: 4.69 10*6/uL (ref 3.80–5.10)
RDW: 13 % (ref 11.0–15.0)
Total Lymphocyte: 28 %
WBC: 7.6 10*3/uL (ref 3.8–10.8)

## 2020-03-23 LAB — VITAMIN B12: Vitamin B-12: 393 pg/mL (ref 200–1100)

## 2020-03-23 NOTE — Assessment & Plan Note (Signed)
Unsure if this could be GI, gyn or MSK in origin or a combination  Some worse with hip rotation-xray of hips done today  Labs pending  Rev last CT (has had renal stones in the past and this feels different)  Had a more recent CT abd/pelvis-sent for this from urology Adv to stop oral nsaids as well due to GI issues F/u planned for pelvic exam and ua if needed next week and we will review films and labs

## 2020-03-23 NOTE — Assessment & Plan Note (Signed)
Hip film today

## 2020-03-23 NOTE — Assessment & Plan Note (Signed)
Fatigue/some malaise Labs today  Also dealing with shoulder pain and pelvic pain-unsure if related Is frustrated  Denies worsened depression but does have stressors

## 2020-03-23 NOTE — Assessment & Plan Note (Signed)
Suspect rotator cuff cause-most likely tendonitis  Advised use of ice Oral nsaid is likely worsening her GI complaints  Will try otc voltaren gel and continue tylenol Adv ice whenever possible  Passive rom exercises to prevent frozen shoulder-handout given  Xray today and more plan to follow rad rev

## 2020-03-27 ENCOUNTER — Other Ambulatory Visit: Payer: Self-pay

## 2020-03-27 ENCOUNTER — Ambulatory Visit (INDEPENDENT_AMBULATORY_CARE_PROVIDER_SITE_OTHER): Payer: PPO | Admitting: Family Medicine

## 2020-03-27 ENCOUNTER — Encounter: Payer: Self-pay | Admitting: Family Medicine

## 2020-03-27 VITALS — BP 141/85 | HR 77 | Temp 97.4°F | Ht 64.5 in | Wt 140.1 lb

## 2020-03-27 DIAGNOSIS — R1030 Lower abdominal pain, unspecified: Secondary | ICD-10-CM | POA: Diagnosis not present

## 2020-03-27 DIAGNOSIS — M25512 Pain in left shoulder: Secondary | ICD-10-CM

## 2020-03-27 DIAGNOSIS — R1031 Right lower quadrant pain: Secondary | ICD-10-CM

## 2020-03-27 DIAGNOSIS — I10 Essential (primary) hypertension: Secondary | ICD-10-CM | POA: Diagnosis not present

## 2020-03-27 DIAGNOSIS — R102 Pelvic and perineal pain: Secondary | ICD-10-CM | POA: Insufficient documentation

## 2020-03-27 DIAGNOSIS — M1611 Unilateral primary osteoarthritis, right hip: Secondary | ICD-10-CM | POA: Diagnosis not present

## 2020-03-27 NOTE — Patient Instructions (Addendum)
Continue your current calcium plus D Add another 2000 iu of vitamin D3 over the counter daily   Eat a balanced diet  Calcium level was one tenth of a point high-will watch this   I placed an order for orthopedic visit for hip and shoulder   I placed an order for ultrasound of pelvis for pelvic pain and tenderness  The office will call to set these up

## 2020-03-27 NOTE — Progress Notes (Signed)
Subjective:    Patient ID: Taylor Ewing, female    DOB: 07/22/1950, 69 y.o.   MRN: [id number]  This visit occurred during the SARS-CoV-2 public health emergency.  Safety protocols were in place, including screening questions prior to the visit, additional usage of staff PPE, and extensive cleaning of exam room while observing appropriate contact time as indicated for disinfecting solutions.    HPI Pt presents for f/u of chronic medical problems   In addition to that had a fall last night - got up too fast and fell backwards  Landed on post op wrist  Hurt a lot but much better today    Wt Readings from Last 3 Encounters:  03/27/20 140 lb 1 oz (63.5 kg)  03/22/20 140 lb (63.5 kg)  11/13/19 140 lb 6 oz (63.7 kg)   23.67 kg/m   Last visit we discussed low abdominal pain /groin pain  Adv to stop nsaids  Got hip xray  Planned to do pelvic exam today  Sent for last CT from urology  Had one bout of diarrhea from Saturday  Urgent bm when she was sweeping her patio  ? If from something she ate  None since then  Some general nausea (mild)  Avoiding nsaids    Hip xray DG Hip Unilat W OR W/O Pelvis 2-3 Views Right (Accession [id number]) (Order [id number]) Imaging Date: 03/22/2020 Department: [place] [facility] Ordering/Authorizing: Mia Milan, Wynelle Fanny, MD  Exam Status  Status  Final [99]  PACS Intelerad Image Link  Show images for DG Hip Unilat W OR W/O Pelvis 2-3 Views Right Study Result  Narrative & Impression  CLINICAL DATA:  Right groin pain, worse with movement.  EXAM: DG HIP (WITH OR WITHOUT PELVIS) 2-3V RIGHT  COMPARISON:  None.  FINDINGS: No fracture or dislocation. Mild-to-moderate degenerative change of the right hip with joint space loss, subchondral sclerosis and osteophytosis. Suspected minimal axial migration. Limited visualization of the pelvis and contralateral left hip is normal.  Presumed vascular calcifications overlie the  lower pelvis bilaterally. Regional soft tissues appear otherwise normal.  IMPRESSION: Mild-to-moderate degenerative change of the right hip.   Electronically Signed   By: Sandi Mariscal M.D.   On: 03/24/2020 10:33     Did xray for L shoulder pain  Suggested voltaren gel  Xray done  DG Shoulder Left (Accession [id number]) (Order [id number]) Imaging Date: 03/22/2020 Department: [place] [facility] Ordering/Authorizing: Taron Conrey, Wynelle Fanny, MD  Exam Status  Status  Final [99]  PACS Intelerad Image Link  Show images for DG Shoulder Left Study Result  Narrative & Impression  CLINICAL DATA:  pain in shoulder with abduction of arm  EXAM: LEFT SHOULDER - 2+ VIEW  COMPARISON:  None.  FINDINGS: No fracture or dislocation. Glenohumeral and acromioclavicular joint spaces are preserved. No evidence of calcific tendinitis. Limited visualization of the adjacent thorax is normal. Regional soft tissues appear normal.  IMPRESSION: No explanation for patient's left shoulder pain.   Electronically Signed   By: Sandi Mariscal M.D.   On: 03/24/2020 10:32      Disc fatigue  Labs done  Results for orders placed or performed in visit on 03/22/20  CBC with Differential/Platelet  Result Value Ref Range   WBC 7.6 3.8 - 10.8 Thousand/uL   RBC 4.69 3.80 - 5.10 Million/uL   Hemoglobin 14.1 11.7 - 15.5 g/dL   HCT 41.5 35 - 45 %   MCV 88.5 80.0 - 100.0 fL   MCH 30.1  27.0 - 33.0 pg   MCHC 34.0 32.0 - 36.0 g/dL   RDW 13.0 11.0 - 15.0 %   Platelets 342 140 - 400 Thousand/uL   MPV 9.4 7.5 - 12.5 fL   Neutro Abs 4,393 1,500 - 7,800 cells/uL   Lymphs Abs 2,128 850 - 3,900 cells/uL   Absolute Monocytes 866 200 - 950 cells/uL   Eosinophils Absolute 160 15 - 500 cells/uL   Basophils Absolute 53 0 - 200 cells/uL   Neutrophils Relative % 57.8 %   Total Lymphocyte 28.0 %   Monocytes Relative 11.4 %   Eosinophils Relative 2.1 %   Basophils Relative 0.7 %  Comprehensive  metabolic panel  Result Value Ref Range   Glucose, Bld 88 65 - 99 mg/dL   BUN 18 7 - 25 mg/dL   Creat 0.78 0.50 - 0.99 mg/dL   BUN/Creatinine Ratio NOT APPLICABLE 6 - 22 (calc)   Sodium 141 135 - 146 mmol/L   Potassium 4.1 3.5 - 5.3 mmol/L   Chloride 106 98 - 110 mmol/L   CO2 25 20 - 32 mmol/L   Calcium 10.5 (H) 8.6 - 10.4 mg/dL   Total Protein 7.6 6.1 - 8.1 g/dL   Albumin 5.1 3.6 - 5.1 g/dL   Globulin 2.5 1.9 - 3.7 g/dL (calc)   AG Ratio 2.0 1.0 - 2.5 (calc)   Total Bilirubin 0.4 0.2 - 1.2 mg/dL   Alkaline phosphatase (APISO) 91 37 - 153 U/L   AST 23 10 - 35 U/L   ALT 24 6 - 29 U/L  TSH  Result Value Ref Range   TSH 0.95 0.40 - 4.50 mIU/L  VITAMIN D 25 Hydroxy (Vit-D Deficiency, Fractures)  Result Value Ref Range   Vit D, 25-Hydroxy 36 30 - 100 ng/mL  Vitamin B12  Result Value Ref Range   Vitamin B-12 393 200 - 1,100 pg/mL    She takes ca and D together - ? How much  Patient Active Problem List   Diagnosis Date Noted  . Arthritis of right hip 03/27/2020  . Pelvic pain 03/27/2020  . Left shoulder pain 03/22/2020  . Fatigue 03/22/2020  . Osteopenia 02/20/2020  . Estrogen deficiency 01/19/2020  . Ganglion cyst 11/13/2019  . Right groin pain 11/13/2019  . Screening for HIV (human immunodeficiency virus) 09/03/2015  . Need for hepatitis C screening test 09/03/2015  . Prediabetes 08/13/2014  . Stress reaction, emotional 08/13/2014  . Routine general medical examination at a health care facility 06/15/2012  . Encounter for routine gynecological examination 06/15/2012  . Essential hypertension 02/10/2012  . Breast cancer screening 09/28/2011  . LOW BACK PAIN, CHRONIC 08/14/2008  . Lower abdominal pain 08/14/2008  . HYPERCHOLESTEROLEMIA 01/28/2007  . Anxiety disorder 01/28/2007  . ALLERGIC RHINITIS 01/28/2007  . Asthma 01/28/2007  . TMJ SYNDROME 01/28/2007  . MIGRAINES, HX OF 01/28/2007   Past Medical History:  Diagnosis Date  . Allergy   . Anxiety   . Asthma     . Cancer ([place])    skin, basal cell  . Essential hypertension   . Insomnia   . Kidney stones 03/02/2012  . Migraines   . TMJ (dislocation of temporomandibular joint)    Past Surgical History:  Procedure Laterality Date  . HEMORRHOID SURGERY  11/2003  . SKIN LESION EXCISION    . TUBAL LIGATION     Social History   Tobacco Use  . Smoking status: Never Smoker  . Smokeless tobacco: Never Used  Substance Use Topics  .  Alcohol use: Yes    Alcohol/week: 0.0 standard drinks    Comment: 1-2 weekly  . Drug use: No   Family History  Problem Relation Age of Onset  . Depression Mother   . Breast cancer Mother   . Depression Father   . Hyperlipidemia Sister   . Cancer Sister        thyroid  . Hyperlipidemia Brother   . Cancer Maternal Grandmother        breast  . Heart attack Maternal Grandmother   . Nephrolithiasis Daughter   . Colon polyps Daughter   . Cancer Maternal Grandfather        stomach   No Known Allergies Current Outpatient Medications on File Prior to Visit  Medication Sig Dispense Refill  . albuterol (PROVENTIL HFA;VENTOLIN HFA) 108 (90 Base) MCG/ACT inhaler INHALE TWO PUFFS BY MOUTH EVERY 4 HOURS AS NEEDED FOR  WHEEZE 9 g 1  . Cetirizine HCl 10 MG TBDP Take 1 tablet by mouth daily.    Marland Kitchen losartan (COZAAR) 100 MG tablet Take 1 tablet (100 mg total) by mouth daily. 90 tablet 3  . sertraline (ZOLOFT) 50 MG tablet Take 1 tablet (50 mg total) by mouth daily. 90 tablet 3  . simvastatin (ZOCOR) 40 MG tablet Take 1 tablet (40 mg total) by mouth daily. 90 tablet 3  . zolpidem (AMBIEN) 10 MG tablet TAKE 1 TABLET BY MOUTH AT BEDTIME AS NEEDED FOR SLEEP 30 tablet 1   No current facility-administered medications on file prior to visit.    Review of Systems  Constitutional: Negative for activity change, appetite change, fatigue, fever and unexpected weight change.  HENT: Negative for congestion, ear pain, rhinorrhea, sinus pressure and sore throat.   Eyes: Negative for  pain, redness and visual disturbance.  Respiratory: Negative for cough, shortness of breath and wheezing.   Cardiovascular: Negative for chest pain and palpitations.  Gastrointestinal: Negative for abdominal pain, blood in stool, constipation and diarrhea.  Endocrine: Negative for polydipsia and polyuria.  Genitourinary: Positive for pelvic pain. Negative for dysuria, frequency, genital sores, urgency, vaginal bleeding, vaginal discharge and vaginal pain.  Musculoskeletal: Positive for arthralgias. Negative for back pain and myalgias.       R shoulder pain   Hip/groin discomfort  Skin: Negative for pallor and rash.  Allergic/Immunologic: Negative for environmental allergies.  Neurological: Negative for dizziness, syncope and headaches.  Hematological: Negative for adenopathy. Does not bruise/bleed easily.  Psychiatric/Behavioral: Negative for decreased concentration and dysphoric mood. The patient is not nervous/anxious.        Objective:   Physical Exam Exam conducted with a chaperone present.  Constitutional:      General: She is not in acute distress.    Appearance: Normal appearance. She is normal weight. She is not ill-appearing.  HENT:     Head: Normocephalic and atraumatic.  Cardiovascular:     Rate and Rhythm: Normal rate and regular rhythm.     Pulses: Normal pulses.     Heart sounds: Normal heart sounds.  Pulmonary:     Effort: Pulmonary effort is normal. No respiratory distress.     Breath sounds: Normal breath sounds.  Genitourinary:    General: Normal vulva.     Vagina: Prolapsed vaginal walls present. No vaginal discharge, tenderness or bleeding.     Adnexa: Right adnexa normal.       Left: Tenderness and fullness present.      Comments: Mild tenderness/fullness with bimanual exam on the L side  Musculoskeletal:  Right lower leg: No edema.     Left lower leg: No edema.     Comments: Limited rom of R shoulder   Skin:    General: Skin is warm and dry.      Findings: No rash.  Neurological:     Mental Status: She is alert.     Gait: Gait normal.  Psychiatric:        Mood and Affect: Mood normal.           Assessment & Plan:   Problem List Items Addressed This Visit      Cardiovascular and Mediastinum   Essential hypertension - Primary    In setting of anxiety and other health c/o  Improved bp on 2nd check BP: (!) 141/85    Will continue to watch        Musculoskeletal and Integument   Arthritis of right hip    Ref to orthopedics Is symptomatic Rev xray      Relevant Orders   Ambulatory referral to Orthopedic Surgery     Other   Lower abdominal pain    Low abd/pelvic  Some tenderness of L adnexal area on exam  Sent for pelvic US      Right groin pain    Suspect due to hip OA Ref to orthopedics       Left shoulder pain    Difficulty abducting arm and rotator cuff signs on last exam  Nl xray Suspect tendonitis Will continue rom exercises  Orthopedic ref done      Relevant Orders   Ambulatory referral to Orthopedic Surgery   Pelvic pain    Some tenderness on bimanual vaginal exam on the L (adnexal area)  Pelvic US planned Labs reviewed       Relevant Orders   US Pelvic Complete With Transvaginal

## 2020-03-27 NOTE — Assessment & Plan Note (Signed)
Suspect due to hip OA Ref to orthopedics

## 2020-03-27 NOTE — Assessment & Plan Note (Signed)
Difficulty abducting arm and rotator cuff signs on last exam  Nl xray Suspect tendonitis Will continue rom exercises  Orthopedic ref done

## 2020-03-27 NOTE — Assessment & Plan Note (Signed)
Low abd/pelvic  Some tenderness of L adnexal area on exam  Sent for pelvic US

## 2020-03-27 NOTE — Assessment & Plan Note (Signed)
In setting of anxiety and other health c/o  Improved bp on 2nd check BP: (!) 141/85    Will continue to watch

## 2020-03-27 NOTE — Assessment & Plan Note (Signed)
Some tenderness on bimanual vaginal exam on the L (adnexal area)  Pelvic US planned Labs reviewed

## 2020-03-27 NOTE — Assessment & Plan Note (Signed)
Ref to orthopedics Is symptomatic Rev xray

## 2020-04-08 ENCOUNTER — Ambulatory Visit
Admission: RE | Admit: 2020-04-08 | Discharge: 2020-04-08 | Disposition: A | Discharge status: Home / Self Care | Payer: PPO | Source: Ambulatory Visit | Attending: Family Medicine | Admitting: Family Medicine

## 2020-04-08 DIAGNOSIS — R102 Pelvic and perineal pain: Secondary | ICD-10-CM | POA: Diagnosis not present

## 2020-04-08 DIAGNOSIS — Z780 Asymptomatic menopausal state: Secondary | ICD-10-CM | POA: Diagnosis not present

## 2020-04-09 ENCOUNTER — Telehealth: Payer: Self-pay | Admitting: Family Medicine

## 2020-04-09 DIAGNOSIS — R935 Abnormal findings on diagnostic imaging of other abdominal regions, including retroperitoneum: Secondary | ICD-10-CM | POA: Insufficient documentation

## 2020-04-09 DIAGNOSIS — R102 Pelvic and perineal pain: Secondary | ICD-10-CM

## 2020-04-09 NOTE — Telephone Encounter (Signed)
Ref done  Will send to [facility] Nothing to do for now-waiting for that eval

## 2020-04-09 NOTE — Telephone Encounter (Signed)
-----   Message from Tammi Sou, [place] sent at 04/09/2020  4:34 PM EDT ----- Pt notified of [place] results and Dr. Marliss Coots comments. Pt agrees with gyn referral (doesn't have a gyn), she would like to see someone in [place] if possible. Pt said she hasn't had any vaginal bleeding or spotting but she is still having intermittent "twinges" of left side pain and pressure. ?? If she should do anything further or wait and see what GYN says 1st

## 2020-04-23 DIAGNOSIS — M25512 Pain in left shoulder: Secondary | ICD-10-CM | POA: Diagnosis not present

## 2020-04-23 DIAGNOSIS — M7502 Adhesive capsulitis of left shoulder: Secondary | ICD-10-CM | POA: Diagnosis not present

## 2020-05-07 DIAGNOSIS — M25612 Stiffness of left shoulder, not elsewhere classified: Secondary | ICD-10-CM | POA: Diagnosis not present

## 2020-05-10 DIAGNOSIS — M25612 Stiffness of left shoulder, not elsewhere classified: Secondary | ICD-10-CM | POA: Diagnosis not present

## 2020-05-17 DIAGNOSIS — M25612 Stiffness of left shoulder, not elsewhere classified: Secondary | ICD-10-CM | POA: Diagnosis not present

## 2020-05-24 DIAGNOSIS — M25612 Stiffness of left shoulder, not elsewhere classified: Secondary | ICD-10-CM | POA: Diagnosis not present

## 2020-05-27 DIAGNOSIS — M25612 Stiffness of left shoulder, not elsewhere classified: Secondary | ICD-10-CM | POA: Diagnosis not present

## 2020-06-03 DIAGNOSIS — M25612 Stiffness of left shoulder, not elsewhere classified: Secondary | ICD-10-CM | POA: Diagnosis not present

## 2020-06-24 DIAGNOSIS — M25612 Stiffness of left shoulder, not elsewhere classified: Secondary | ICD-10-CM | POA: Diagnosis not present

## 2020-08-26 ENCOUNTER — Other Ambulatory Visit: Payer: Self-pay | Admitting: Family Medicine

## 2020-08-26 NOTE — Telephone Encounter (Signed)
Name of Medication: Ambien Name of Pharmacy: [facility] or Written Date and Quantity: 03/04/20 #30 tabs with 1 additional refills Last Office Visit and Type: F/U on 03/27/20 Next Office Visit and Type: none scheduled

## 2020-11-18 ENCOUNTER — Other Ambulatory Visit: Payer: Self-pay | Admitting: Family Medicine

## 2020-12-18 ENCOUNTER — Other Ambulatory Visit: Payer: Self-pay | Admitting: Family Medicine

## 2020-12-19 NOTE — Telephone Encounter (Signed)
Name of Medication: Lorrin Mais Name of Pharmacy: [facility] or Written Date and Quantity: 08/26/20 #30 tabs/ 1 refill Last Office Visit and Type: f/u on 03/27/20 Next Office Visit and Type: none scheduled

## 2021-01-04 ENCOUNTER — Other Ambulatory Visit: Payer: Self-pay | Admitting: Family Medicine

## 2021-02-13 ENCOUNTER — Other Ambulatory Visit: Payer: Self-pay | Admitting: Family Medicine

## 2021-02-14 NOTE — Telephone Encounter (Signed)
Pt is due for her AWV/CPE or at least a med refill appt. Next month, will refill meds once but please call and schedule appts, thanks

## 2021-02-17 DIAGNOSIS — Z1231 Encounter for screening mammogram for malignant neoplasm of breast: Secondary | ICD-10-CM | POA: Diagnosis not present

## 2021-03-29 IMAGING — DX DG SHOULDER 2+V*L*
3 series · 3 of 3 positions shown · non-contrast
Comparison: None.

CLINICAL DATA: pain in shoulder with abduction of arm

EXAM:
LEFT SHOULDER - 2+ VIEW

[shoulder axial]
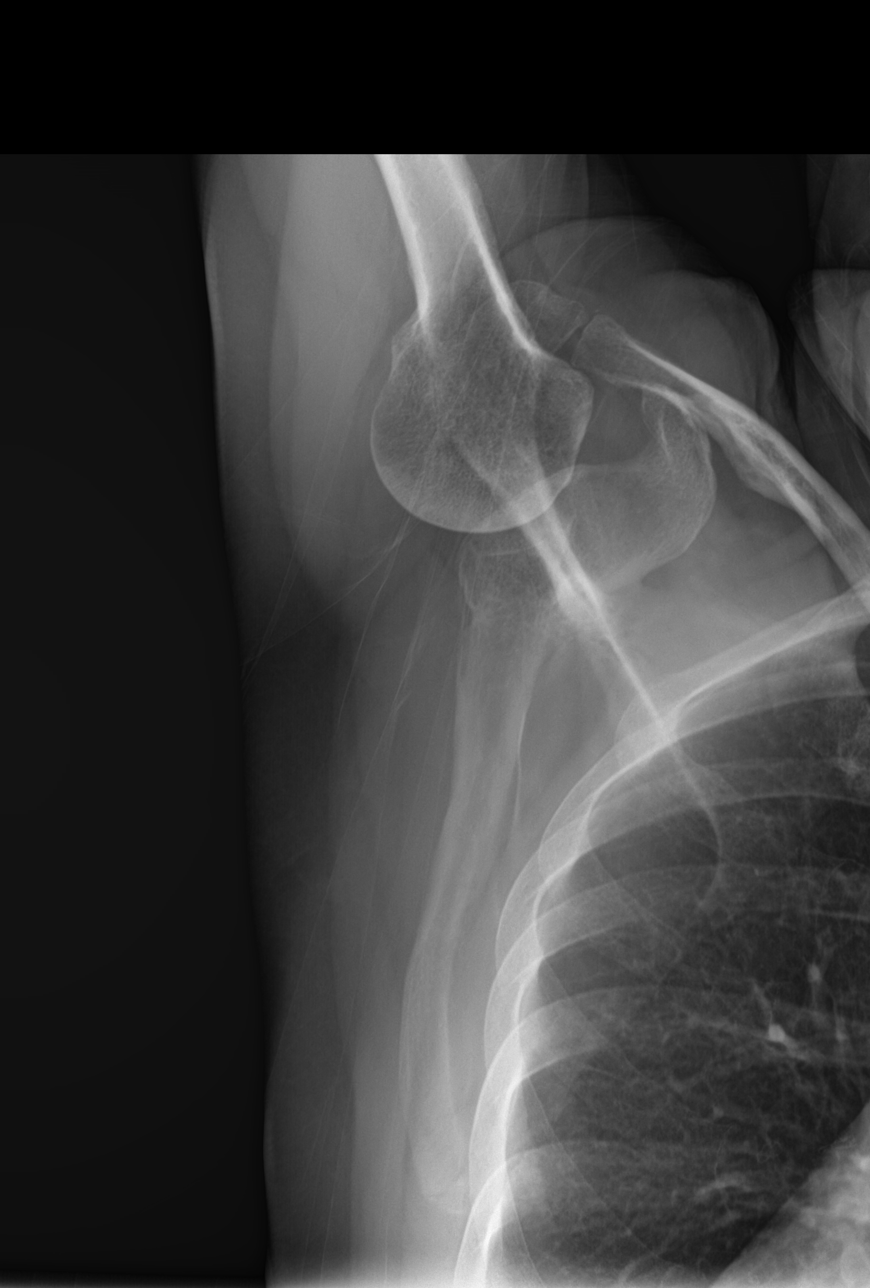

[shoulder ap]
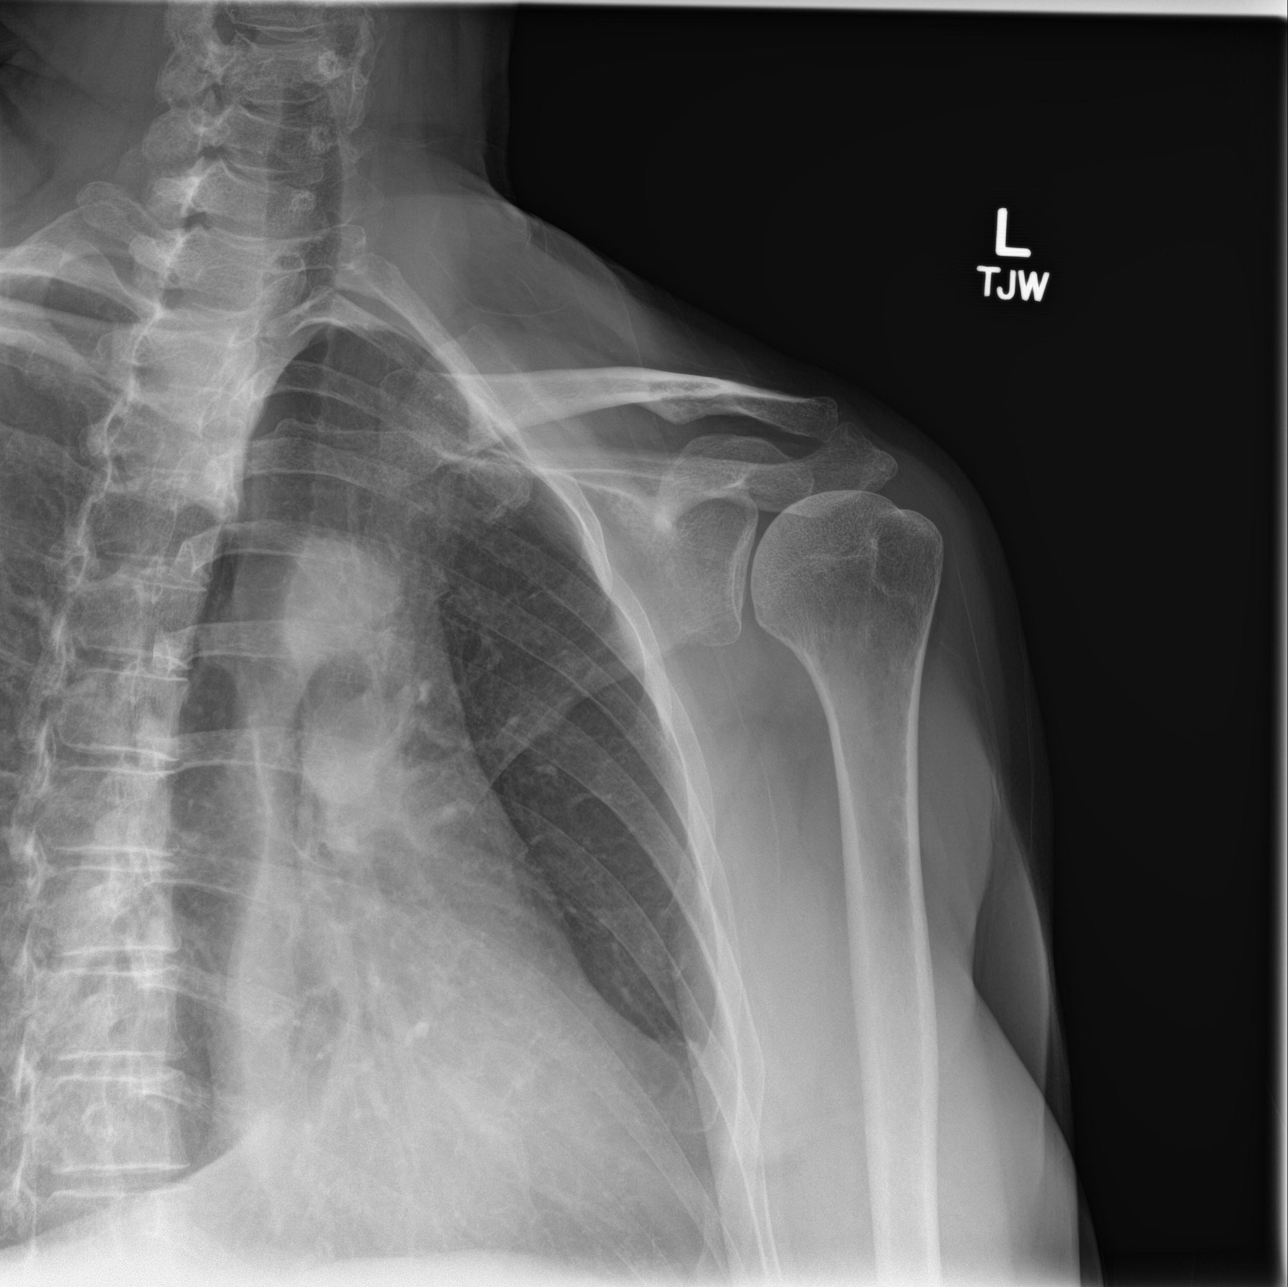

[shoulder y-view]
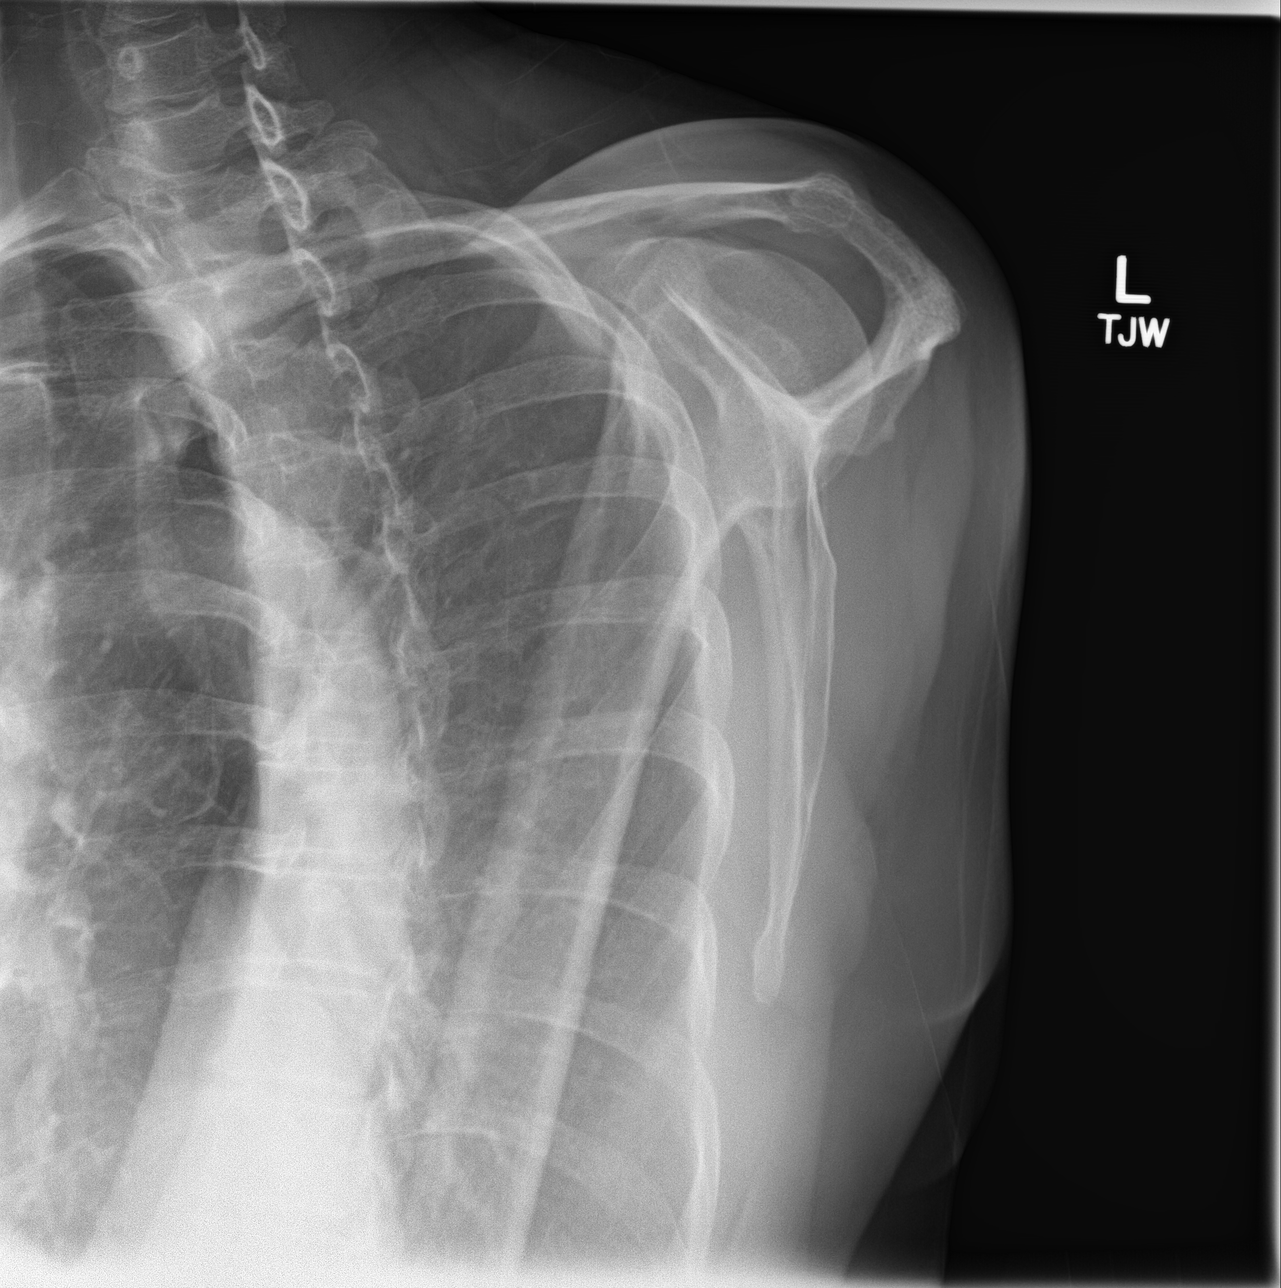

[3 of 3 positions shown; findings below may reference images not displayed]

FINDINGS: No fracture or dislocation. Glenohumeral and acromioclavicular joint
spaces are preserved. No evidence of calcific tendinitis. Limited
visualization of the adjacent thorax is normal. Regional soft
tissues appear normal.
IMPRESSION: No explanation for patient's left shoulder pain.

## 2021-03-31 ENCOUNTER — Other Ambulatory Visit: Payer: Self-pay | Admitting: Family Medicine

## 2021-04-02 NOTE — Telephone Encounter (Signed)
Pt needs a f/u or CPE scheduled. Please scheduled when able. If pt needs to wait until we move back it's fine. Once scheduled please route to me to refill med

## 2021-04-03 NOTE — Telephone Encounter (Signed)
Pt has scheduled follow up for after we are back at [facility]

## 2021-04-15 IMAGING — US US PELVIS COMPLETE WITH TRANSVAGINAL
1 series · 13 of 25 positions shown · non-contrast
Comparison: 08/15/2008

CLINICAL DATA: LEFT pelvic pain for 1 month, LEFT adnexal
tenderness on bimanual exam, postmenopausal

EXAM:
TRANSABDOMINAL AND TRANSVAGINAL ULTRASOUND OF PELVIS
TECHNIQUE: Both transabdominal and transvaginal ultrasound examinations of the
pelvis were performed. Transabdominal technique was performed for
global imaging of the pelvis including uterus, ovaries, adnexal
regions, and pelvic cul-de-sac. It was necessary to proceed with
endovaginal exam following the transabdominal exam to visualize the
endometrium and ovaries.

[Series 1: us pelvis complete with transvaginal · 0.16mm/px · 13 of 66 slices shown]
[im 1/66]
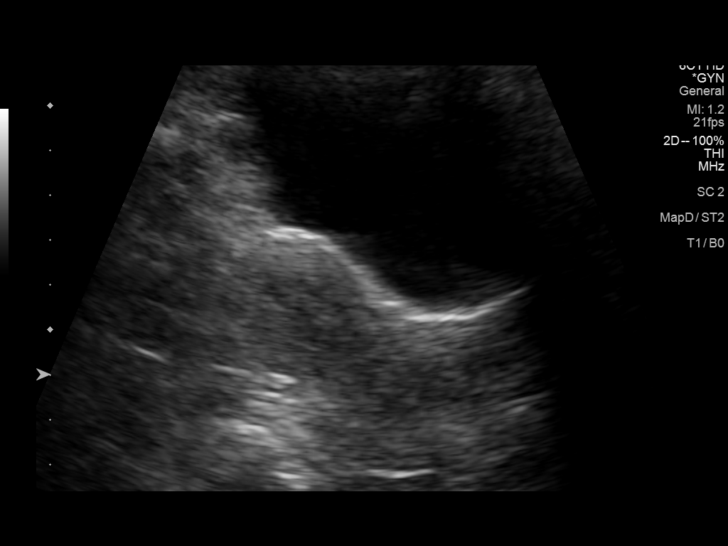
[im 6/66]
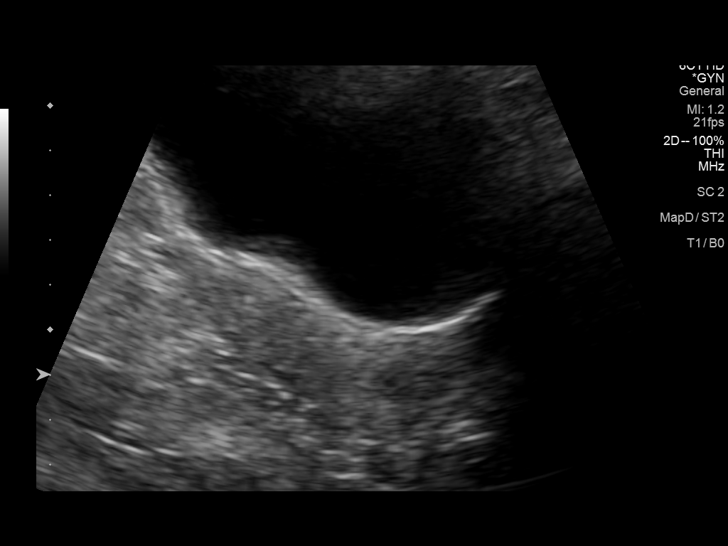
[im 11/66]
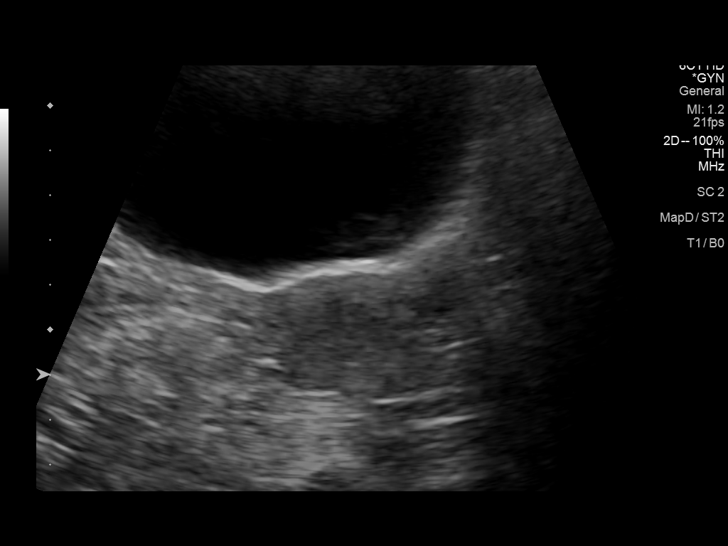
[im 17/66]
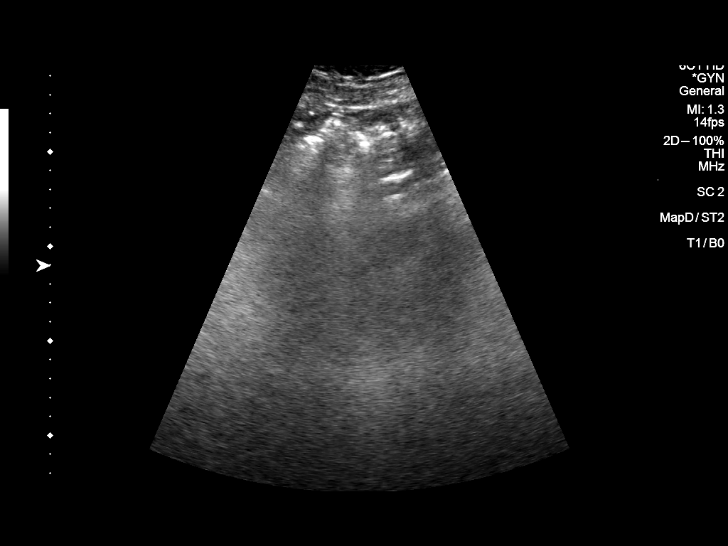
[im 22/66]
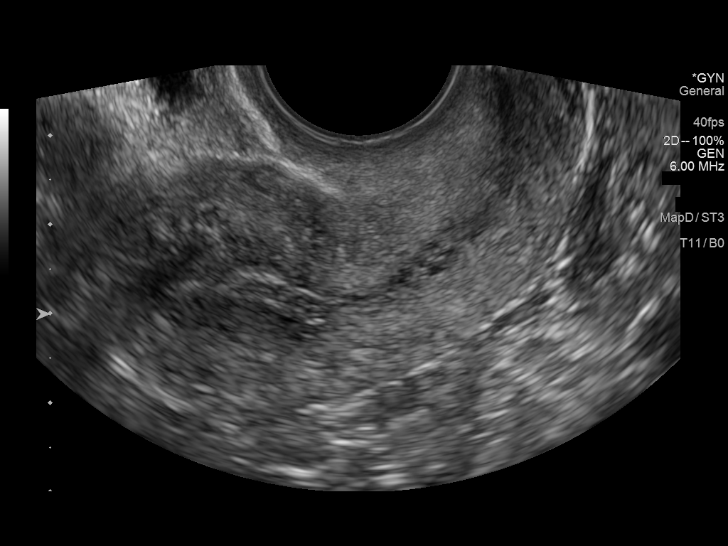
[im 28/66]
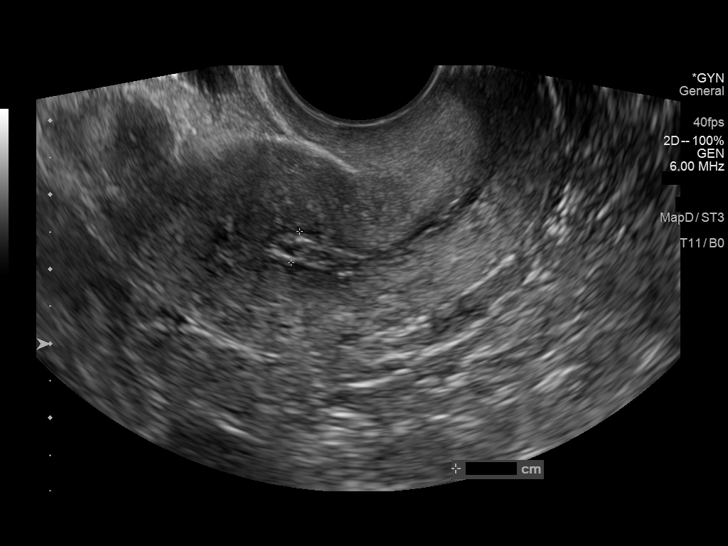
[im 33/66]
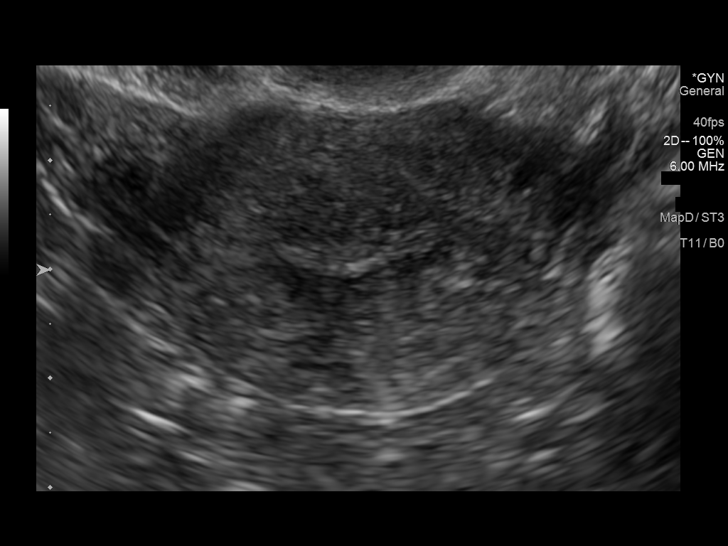
[im 38/66]
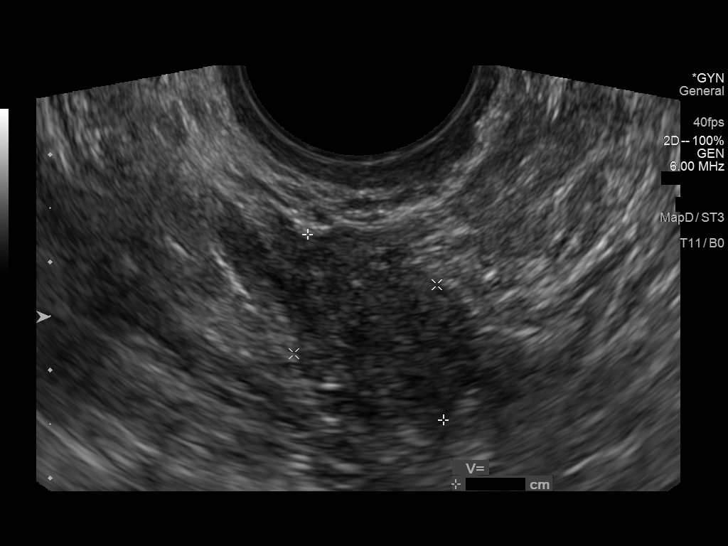
[im 44/66]
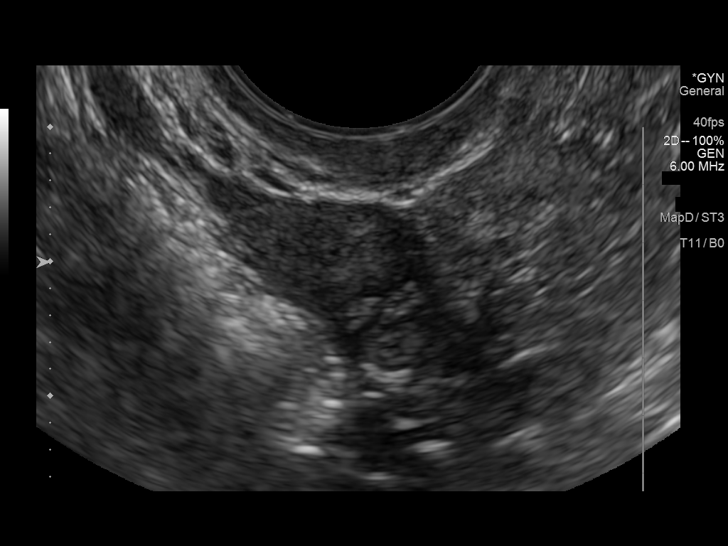
[im 49/66]
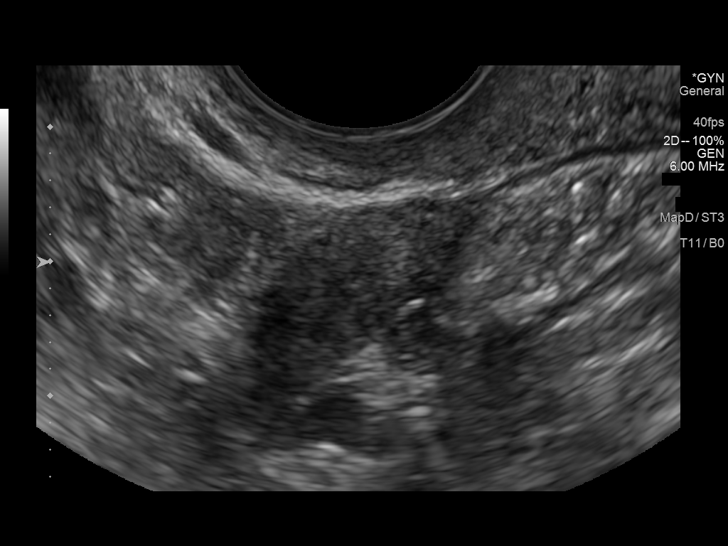
[im 55/66]
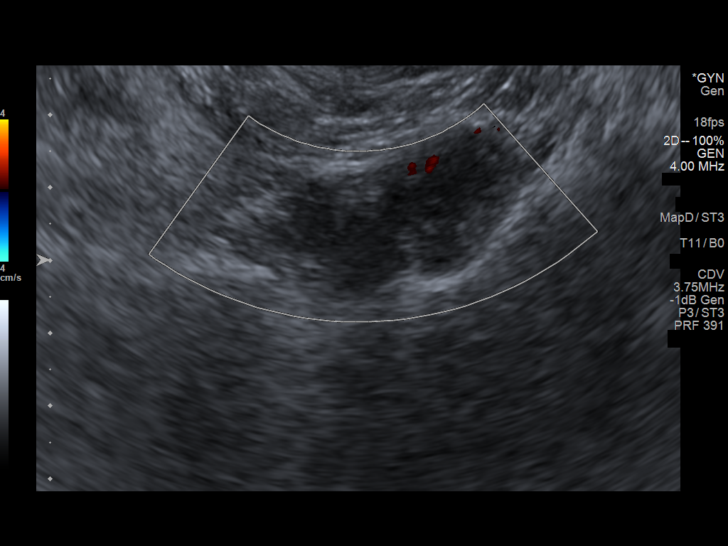
[im 60/66]
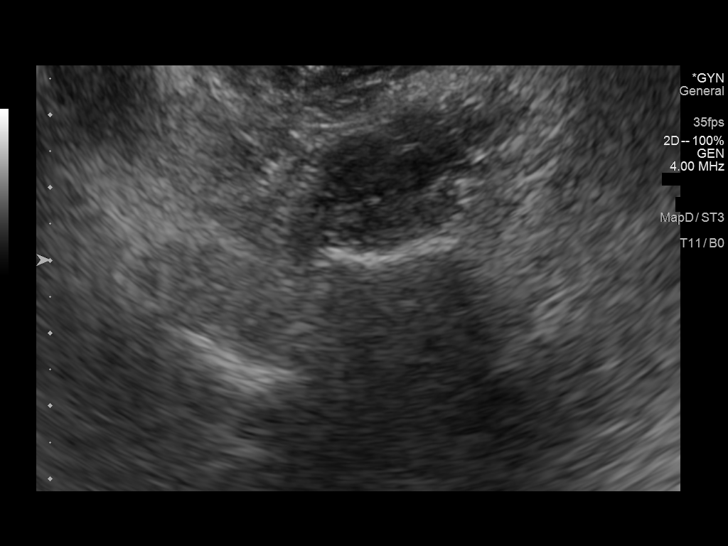
[im 66/66]
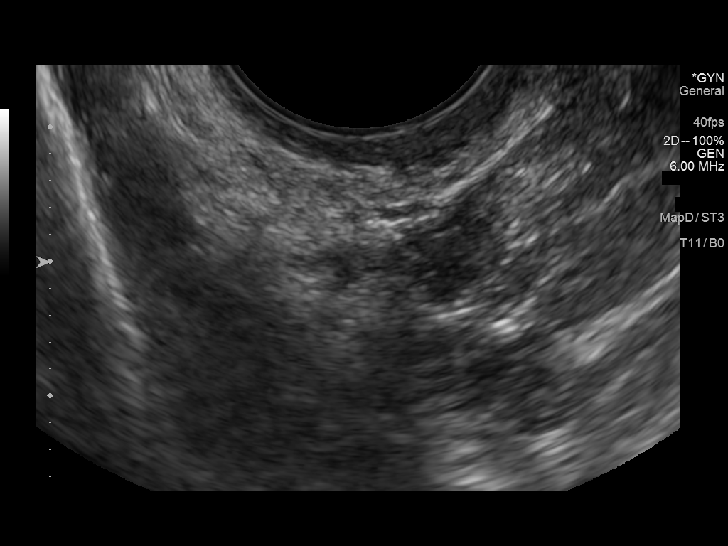

[13 of 25 positions shown; findings below may reference images not displayed]

FINDINGS: Uterus

Measurements: 5.5 x 2.7 x 3.6 cm = volume: 28 mL. Anteverted. Mildly
heterogeneous myometrium. No focal mass.

Endometrium

Thickness: 4 mm. Heterogeneous appearance. Questionably tiny amount
of fluid at upper uterine segment. No discrete mass.

Right ovary

Measurements: 2.1 x 1.5 x 1.0 cm = volume: 1.6 mL. Normal morphology
without mass

Left ovary

Measurements: 3.5 x 2.0 x 1.8 cm = volume: 6.6 mL. Normal morphology
without mass.

Other findings

No free pelvic fluid.  No adnexal masses.
IMPRESSION: Heterogeneous appearance of endometrial complex with questionably a
tiny amount of fluid at upper uterine segment, nonspecific.

No additional pelvic sonographic abnormalities identified.

## 2021-04-28 ENCOUNTER — Other Ambulatory Visit: Payer: Self-pay | Admitting: Family Medicine

## 2021-04-28 NOTE — Telephone Encounter (Signed)
Name of Medication: Lorrin Mais Name of Pharmacy: [facility] or Written Date and Quantity: 12/19/20 #30 tabs/ 1 refill Last Office Visit and Type: f/u on 03/27/20 Next Office Visit and Type: f/u on 07/14/21

## 2021-05-14 ENCOUNTER — Other Ambulatory Visit: Payer: Self-pay | Admitting: Family Medicine

## 2021-07-06 HISTORY — PX: BREAST BIOPSY: SHX20

## 2021-07-14 ENCOUNTER — Telehealth: Payer: Self-pay | Admitting: *Deleted

## 2021-07-14 ENCOUNTER — Ambulatory Visit: Payer: PPO | Admitting: Family Medicine

## 2021-07-14 NOTE — Telephone Encounter (Signed)
It appears that patient has been rescheduled for tomorrow 07/15/21.

## 2021-07-14 NOTE — Telephone Encounter (Signed)
PLEASE NOTE: All timestamps contained within this report are represented as [place] Standard Time. CONFIDENTIALTY NOTICE: This fax transmission is intended only for the addressee. It contains information that is legally privileged, confidential or otherwise protected from use or disclosure. If you are not the intended recipient, you are strictly prohibited from reviewing, disclosing, copying using or disseminating any of this information or taking any action in reliance on or regarding this information. If you have received this fax in error, please notify us immediately by telephone so that we can arrange for its return to [place]. Phone: [phone], Toll-Free: [phone], Fax: [phone] Page: 1 of 1 Call Id: [id number] [facility] - Client Nonclinical Telephone Record  [facility] - Client Client Site [place] Provider Loura Pardon - MD Contact Type Call Who Is Calling Patient / Member / Family / Caregiver Caller Name [place] Phone Number [phone] Patient Name Taylor Ewing Patient DOB 1950/07/09 Call Type Message Only Information Provided Reason for Call Request for General Office Information Initial Comment Caller states she would like to know where her appt will be at for today. Additional Comment Provided office hours. Disp. Time Disposition Final User 07/14/2021 8:00:19 AM General Information Provided Yes Silvestre Moment Call Closed By: Silvestre Moment Transaction Date/Time: 07/14/2021 7:56:47 AM (ET)

## 2021-07-15 ENCOUNTER — Encounter: Payer: Self-pay | Admitting: Family Medicine

## 2021-07-15 ENCOUNTER — Other Ambulatory Visit: Payer: Self-pay

## 2021-07-15 ENCOUNTER — Ambulatory Visit (INDEPENDENT_AMBULATORY_CARE_PROVIDER_SITE_OTHER): Payer: PPO | Admitting: Family Medicine

## 2021-07-15 VITALS — BP 133/80 | HR 77 | Temp 98.1°F | Resp 16 | Ht 64.0 in | Wt 144.0 lb

## 2021-07-15 DIAGNOSIS — R7303 Prediabetes: Secondary | ICD-10-CM | POA: Diagnosis not present

## 2021-07-15 DIAGNOSIS — F419 Anxiety disorder, unspecified: Secondary | ICD-10-CM

## 2021-07-15 DIAGNOSIS — I10 Essential (primary) hypertension: Secondary | ICD-10-CM

## 2021-07-15 DIAGNOSIS — M85859 Other specified disorders of bone density and structure, unspecified thigh: Secondary | ICD-10-CM

## 2021-07-15 DIAGNOSIS — E7800 Pure hypercholesterolemia, unspecified: Secondary | ICD-10-CM | POA: Diagnosis not present

## 2021-07-15 DIAGNOSIS — Z0000 Encounter for general adult medical examination without abnormal findings: Secondary | ICD-10-CM

## 2021-07-15 DIAGNOSIS — R0789 Other chest pain: Secondary | ICD-10-CM | POA: Insufficient documentation

## 2021-07-15 LAB — COMPREHENSIVE METABOLIC PANEL
ALT: 32 U/L (ref 0–35)
AST: 26 U/L (ref 0–37)
Albumin: 4.9 g/dL (ref 3.5–5.2)
Alkaline Phosphatase: 83 U/L (ref 39–117)
BUN: 15 mg/dL (ref 6–23)
CO2: 27 mEq/L (ref 19–32)
Calcium: 9.7 mg/dL (ref 8.4–10.5)
Chloride: 105 mEq/L (ref 96–112)
Creatinine, Ser: 0.75 mg/dL (ref 0.40–1.20)
GFR: 81.78 mL/min (ref >60.00)
Glucose, Bld: 98 mg/dL (ref 70–99)
Potassium: 4 mEq/L (ref 3.5–5.1)
Sodium: 138 mEq/L (ref 135–145)
Total Bilirubin: 0.5 mg/dL (ref 0.2–1.2)
Total Protein: 7.7 g/dL (ref 6.0–8.3)

## 2021-07-15 LAB — CBC WITH DIFFERENTIAL/PLATELET
Basophils Absolute: 0 10*3/uL (ref 0.0–0.1)
Basophils Relative: 0.4 % (ref 0.0–3.0)
Eosinophils Absolute: 0.1 10*3/uL (ref 0.0–0.7)
Eosinophils Relative: 1.3 % (ref 0.0–5.0)
HCT: 41 % (ref 36.0–46.0)
Hemoglobin: 13.7 g/dL (ref 12.0–15.0)
Lymphocytes Relative: 26.7 % (ref 12.0–46.0)
Lymphs Abs: 1.4 10*3/uL (ref 0.7–4.0)
MCHC: 33.3 g/dL (ref 30.0–36.0)
MCV: 90.1 fl (ref 78.0–100.0)
Monocytes Absolute: 0.4 10*3/uL (ref 0.1–1.0)
Monocytes Relative: 8.2 % (ref 3.0–12.0)
Neutro Abs: 3.3 10*3/uL (ref 1.4–7.7)
Neutrophils Relative %: 63.4 % (ref 43.0–77.0)
Platelets: 290 10*3/uL (ref 150.0–400.0)
RBC: 4.55 Mil/uL (ref 3.87–5.11)
RDW: 13.3 % (ref 11.5–15.5)
WBC: 5.2 10*3/uL (ref 4.0–10.5)

## 2021-07-15 LAB — TSH: TSH: 1.04 u[IU]/mL (ref 0.35–5.50)

## 2021-07-15 LAB — LDL CHOLESTEROL, DIRECT: Direct LDL: 89 mg/dL

## 2021-07-15 LAB — HEMOGLOBIN A1C: Hgb A1c MFr Bld: 5.7 % (ref 4.6–6.5)

## 2021-07-15 LAB — LIPID PANEL
Cholesterol: 167 mg/dL (ref 0–200)
HDL: 42.7 mg/dL (ref >39.00)
NonHDL: 124.3
Total CHOL/HDL Ratio: 4
Triglycerides: 210 mg/dL — ABNORMAL HIGH (ref 0.0–149.0)
VLDL: 42 mg/dL — ABNORMAL HIGH (ref 0.0–40.0)

## 2021-07-15 MED ORDER — LOSARTAN POTASSIUM 100 MG PO TABS: 100.0000 mg | ORAL_TABLET | Freq: Every day | ORAL | 3 refills | Status: DC

## 2021-07-15 MED ORDER — SERTRALINE HCL 50 MG PO TABS: 50.0000 mg | ORAL_TABLET | Freq: Every day | ORAL | 3 refills | Status: DC

## 2021-07-15 MED ORDER — SIMVASTATIN 40 MG PO TABS: 40.0000 mg | ORAL_TABLET | Freq: Every day | ORAL | 3 refills | Status: DC

## 2021-07-15 MED ORDER — FAMOTIDINE 20 MG PO TABS: 20.0000 mg | ORAL_TABLET | Freq: Every day | ORAL | 3 refills | Status: DC

## 2021-07-15 NOTE — Assessment & Plan Note (Signed)
dexa utd from 02/2020 No falls or fractures  Taking mvi and vitamin D3 1000 iu daily  Walking from exercise   Can re check dexa august at the earliest

## 2021-07-15 NOTE — Assessment & Plan Note (Signed)
bp in fair control at this time  BP Readings from Last 1 Encounters:  07/15/21 133/80   No changes needed Most recent labs reviewed  Disc lifstyle change with low sodium diet and exercise  Plan to continue losartan 100 mg daily  Labs ordered

## 2021-07-15 NOTE — Progress Notes (Signed)
Subjective:    Patient ID: Taylor Ewing, female    DOB: 04-03-1951, 71 y.o.   MRN: [id number]  This visit occurred during the SARS-CoV-2 public health emergency.  Safety protocols were in place, including screening questions prior to the visit, additional usage of staff PPE, and extensive cleaning of exam room while observing appropriate contact time as indicated for disinfecting solutions.   HPI  Here for health maintenance exam and to review chronic medical problems     Wt Readings from Last 3 Encounters:  07/15/21 144 lb (65.3 kg)  03/27/20 140 lb 1 oz (63.5 kg)  03/22/20 140 lb (63.5 kg)   24.72 kg/m  Has been pretty good overall  Some aches and pains   Taking care of herself fairly well  Eating better/ mindful of that  Still walking for exercise /enjoys it   Covid immunized with booster  Had flu shot in November  Tdap 08/2014 Had shingrix vaccines  Pna vaccines completed  Dexa 02/2020-osteopenia of [place] none  Fractures none Supplements - mvi and D3 1000 iu  Exercise -walking    Mammogram 02/2021 Self breast exam-no lumps   Colonoscopy 01/2014   No new family history   HTN bp is stable today  No cp or palpitations or headaches or edema  No side effects to medicines  BP Readings from Last 3 Encounters:  07/15/21 133/80  03/27/20 (!) 141/85  03/22/20 132/74    Pulse Readings from Last 3 Encounters:  07/15/21 77  03/27/20 77  03/22/20 82    Takes losartan 100 mg daily   Occ wakes up with sensation of being squeezed -left side/arm  It lasts 5-10 minutes at most  Not sob , feels ok  Some heartburn also   Due for labs   Hyperlipidemia  Lab Results  Component Value Date   CHOL 162 11/10/2019   HDL 41.30 11/10/2019   LDLCALC 83 11/10/2019   LDLDIRECT 71.0 11/29/2018   TRIG 191.0 (H) 11/10/2019   CHOLHDL 4 11/10/2019   Takes zocor 40 mg daily  Does watch diet for fats  The 10-year ASCVD risk score (Arnett DK, et al., 2019) is: 11%    Values used to calculate the score:     Age: 36 years     Sex: Female     Is Non-Hispanic African American: No     Diabetic: No     Tobacco smoker: No     Systolic Blood Pressure: 621 mmHg     Is BP treated: Yes     HDL Cholesterol: 42.7 mg/dL     Total Cholesterol: 167 mg/dL   Prediabetes Lab Results  Component Value Date   HGBA1C 5.6 11/10/2019  She cut back on sweets  Does not drink sodas as a rule  No tea   Needs to drink more water-puts flavoring /artificial sweetner    H/o anxiety disorder /still there , some days worse than others , tolerable Stress level is about the same Got worse during the pandemic Takes sertraline 50 mg daily - helpful , wants to stay on it    Ambien for sleep  EKG today NSR rate of 75 with no acute changes   Patient Active Problem List   Diagnosis Date Noted   Chest tightness 07/15/2021   Abnormal ultrasound of endometrium 04/09/2020   Arthritis of right hip 03/27/2020   Pelvic pain 03/27/2020   Fatigue 03/22/2020   Osteopenia 02/20/2020   Estrogen deficiency 01/19/2020   Ganglion  cyst 11/13/2019   Screening for HIV (human immunodeficiency virus) 09/03/2015   Need for hepatitis C screening test 09/03/2015   Prediabetes 08/13/2014   Stress reaction, emotional 08/13/2014   Routine general medical examination at a health care facility 06/15/2012   Encounter for routine gynecological examination 06/15/2012   Essential hypertension 02/10/2012   Breast cancer screening 09/28/2011   LOW BACK PAIN, CHRONIC 08/14/2008   Lower abdominal pain 08/14/2008   HYPERCHOLESTEROLEMIA 01/28/2007   Anxiety disorder 01/28/2007   ALLERGIC RHINITIS 01/28/2007   Asthma 01/28/2007   TMJ SYNDROME 01/28/2007   MIGRAINES, HX OF 01/28/2007   Past Medical History:  Diagnosis Date   Allergy    Anxiety    Asthma    Cancer ([place])    skin, basal cell   Essential hypertension    Insomnia    Kidney stones 03/02/2012   Migraines    TMJ (dislocation of  temporomandibular joint)    Past Surgical History:  Procedure Laterality Date   HEMORRHOID SURGERY  11/2003   SKIN LESION EXCISION     TUBAL LIGATION     Social History   Tobacco Use   Smoking status: Never   Smokeless tobacco: Never  Substance Use Topics   Alcohol use: Yes    Alcohol/week: 0.0 standard drinks    Comment: 1-2 weekly   Drug use: No   Family History  Problem Relation Age of Onset   Depression Mother    Breast cancer Mother    Depression Father    Hyperlipidemia Sister    Cancer Sister        thyroid   Hyperlipidemia Brother    Cancer Maternal Grandmother        breast   Heart attack Maternal Grandmother    Nephrolithiasis Daughter    Colon polyps Daughter    Cancer Maternal Grandfather        stomach   No Known Allergies Current Outpatient Medications on File Prior to Visit  Medication Sig Dispense Refill   albuterol (PROVENTIL HFA;VENTOLIN HFA) 108 (90 Base) MCG/ACT inhaler INHALE TWO PUFFS BY MOUTH EVERY 4 HOURS AS NEEDED FOR  WHEEZE 9 g 1   Cetirizine HCl 10 MG TBDP Take 1 tablet by mouth daily.     cholecalciferol (VITAMIN D3) 25 MCG (1000 UNIT) tablet Take 2,000 Units by mouth daily.     Multiple Vitamin (MULTIVITAMIN) tablet Take 1 tablet by mouth daily.     Omega-3 Fatty Acids (FISH OIL) 1200 MG CPDR Take by mouth. Patient states she takes 1280mg  ( 2 tabs) daily.     zolpidem (AMBIEN) 10 MG tablet TAKE 1 TABLET BY MOUTH AT BEDTIME AS NEEDED FOR SLEEP 30 tablet 1   No current facility-administered medications on file prior to visit.    Review of Systems  Constitutional:  Negative for activity change, appetite change, fatigue, fever and unexpected weight change.  HENT:  Negative for congestion, ear pain, rhinorrhea, sinus pressure and sore throat.   Eyes:  Negative for pain, redness and visual disturbance.  Respiratory:  Negative for cough, shortness of breath and wheezing.        Occ chest tightness at night  Cardiovascular:  Negative for  chest pain and palpitations.  Gastrointestinal:  Negative for abdominal pain, blood in stool, constipation and diarrhea.       Heartburn at night   Endocrine: Negative for polydipsia and polyuria.  Genitourinary:  Negative for dysuria, frequency and urgency.  Musculoskeletal:  Negative for arthralgias, back pain and  myalgias.  Skin:  Negative for pallor and rash.  Allergic/Immunologic: Negative for environmental allergies.  Neurological:  Negative for dizziness, syncope and headaches.  Hematological:  Negative for adenopathy. Does not bruise/bleed easily.  Psychiatric/Behavioral:  Negative for decreased concentration and dysphoric mood. The patient is not nervous/anxious.       Objective:   Physical Exam Constitutional:      General: She is not in acute distress.    Appearance: Normal appearance. She is well-developed and normal weight. She is not ill-appearing or diaphoretic.  HENT:     Head: Normocephalic and atraumatic.     Right Ear: Tympanic membrane, ear canal and external ear normal.     Left Ear: Tympanic membrane, ear canal and external ear normal.     Nose: Nose normal. No congestion.     Mouth/Throat:     Mouth: Mucous membranes are moist.     Pharynx: Oropharynx is clear. No posterior oropharyngeal erythema.  Eyes:     General: No scleral icterus.    Extraocular Movements: Extraocular movements intact.     Conjunctiva/sclera: Conjunctivae normal.     Pupils: Pupils are equal, round, and reactive to light.  Neck:     Thyroid: No thyromegaly.     Vascular: No carotid bruit or JVD.  Cardiovascular:     Rate and Rhythm: Normal rate and regular rhythm.     Pulses: Normal pulses.     Heart sounds: Normal heart sounds.    No gallop.  Pulmonary:     Effort: Pulmonary effort is normal. No respiratory distress.     Breath sounds: Normal breath sounds. No wheezing.     Comments: Good air exch Chest:     Chest wall: No tenderness.  Abdominal:     General: Bowel sounds  are normal. There is no distension or abdominal bruit.     Palpations: Abdomen is soft. There is no mass.     Tenderness: There is no abdominal tenderness.     Hernia: No hernia is present.  Genitourinary:    Comments: Breast exam: No mass, nodules, thickening, tenderness, bulging, retraction, inflamation, nipple discharge or skin changes noted.  No axillary or clavicular LA.     Musculoskeletal:        General: No tenderness. Normal range of motion.     Cervical back: Normal range of motion and neck supple. No rigidity. No muscular tenderness.     Right lower leg: No edema.     Left lower leg: No edema.     Comments: No kyphosis   Lymphadenopathy:     Cervical: No cervical adenopathy.  Skin:    General: Skin is warm and dry.     Coloration: Skin is not pale.     Findings: No erythema or rash.     Comments: Solar lentigines diffusely   Neurological:     Mental Status: She is alert. Mental status is at baseline.     Cranial Nerves: No cranial nerve deficit.     Motor: No abnormal muscle tone.     Coordination: Coordination normal.     Gait: Gait normal.     Deep Tendon Reflexes: Reflexes are normal and symmetric. Reflexes normal.  Psychiatric:        Mood and Affect: Mood normal.        Cognition and Memory: Cognition and memory normal.     Comments: Pleasant and talkative           Assessment & Plan:  Problem List Items Addressed This Visit       Cardiovascular and Mediastinum   Essential hypertension    bp in fair control at this time  BP Readings from Last 1 Encounters:  07/15/21 133/80  No changes needed Most recent labs reviewed  Disc lifstyle change with low sodium diet and exercise  Plan to continue losartan 100 mg daily  Labs ordered       Relevant Medications   simvastatin (ZOCOR) 40 MG tablet   losartan (COZAAR) 100 MG tablet   Other Relevant Orders   CBC with Differential/Platelet (Completed)   Comprehensive metabolic panel (Completed)   Lipid  panel (Completed)   TSH (Completed)   EKG 12-Lead (Completed)     Musculoskeletal and Integument   Osteopenia    dexa utd from 02/2020 No falls or fractures  Taking mvi and vitamin D3 1000 iu daily  Walking from exercise   Can re check dexa august at the earliest         Other   HYPERCHOLESTEROLEMIA    Disc goals for lipids and reasons to control them Rev last labs with pt Rev low sat fat diet in detail Labs ordered  Plan to continue simvastatin 40 mg daily       Relevant Medications   simvastatin (ZOCOR) 40 MG tablet   losartan (COZAAR) 100 MG tablet   Other Relevant Orders   Lipid panel (Completed)   Anxiety disorder    She has good and bad days, sertraline is still very helpful and wants to continue it  Reviewed stressors/ coping techniques/symptoms/ support sources/ tx options and side effects in detail today Encouraged exercise and good self care  Still takes occ ambien for sleep      Relevant Medications   sertraline (ZOLOFT) 50 MG tablet   Routine general medical examination at a health care facility - Primary    Reviewed health habits including diet and exercise and skin cancer prevention Reviewed appropriate screening tests for age  Also reviewed health mt list, fam hx and immunization status , as well as social and family history   See HPI Labs ordered  Vaccines utd  dexa utd-no falls or fractures , continues mvi and vit D Mammogram is up to date Colonoscopy is up to date       Prediabetes    A1C ordered disc imp of low glycemic diet and wt loss to prevent DM2       Relevant Orders   Hemoglobin A1c (Completed)

## 2021-07-15 NOTE — Patient Instructions (Addendum)
Check your blood pressure every once in a while when relaxed, arm at heart level and both feet on the floor   Labs today   Keep walking   Take care of yourself   Take pepcid 20 mg every evening -for acid reflux  Try it for a few months   If the chest symptoms persist beyond a week -call  If worse - get to the ER

## 2021-07-15 NOTE — Assessment & Plan Note (Signed)
She has good and bad days, sertraline is still very helpful and wants to continue it  Reviewed stressors/ coping techniques/symptoms/ support sources/ tx options and side effects in detail today Encouraged exercise and good self care  Still takes occ ambien for sleep

## 2021-07-15 NOTE — Assessment & Plan Note (Signed)
Pt mentions this on the way out today  Occasionally at night-wakes up with  Anterior/lateral  Sometimes occurs with hearburn No acute changes on EKG Labs pending  Px pepcid to use each evening  Low threshold to ref to cardiology if this does not resolve with gerd tx

## 2021-07-15 NOTE — Assessment & Plan Note (Signed)
A1C ordered °disc imp of low glycemic diet and wt loss to prevent DM2  °

## 2021-07-15 NOTE — Assessment & Plan Note (Signed)
Reviewed health habits including diet and exercise and skin cancer prevention Reviewed appropriate screening tests for age  Also reviewed health mt list, fam hx and immunization status , as well as social and family history   See HPI Labs ordered  Vaccines utd  dexa utd-no falls or fractures , continues mvi and vit D Mammogram is up to date Colonoscopy is up to date

## 2021-07-15 NOTE — Assessment & Plan Note (Signed)
Disc goals for lipids and reasons to control them Rev last labs with pt Rev low sat fat diet in detail Labs ordered  Plan to continue simvastatin 40 mg daily

## 2021-07-28 ENCOUNTER — Other Ambulatory Visit: Payer: Self-pay | Admitting: Family Medicine

## 2021-07-29 NOTE — Telephone Encounter (Signed)
Name of Medication: Lorrin Mais Name of Pharmacy: [facility] or Written Date and Quantity: 04/28/21 #30 tabs/ 1 refill Last Office Visit and Type: CPE on 07/16/11 Next Office Visit and Type: none scheduled

## 2021-11-18 ENCOUNTER — Other Ambulatory Visit: Payer: Self-pay | Admitting: Family Medicine

## 2021-12-10 ENCOUNTER — Other Ambulatory Visit: Payer: Self-pay | Admitting: Family Medicine

## 2021-12-11 NOTE — Telephone Encounter (Signed)
Last filled 09/27/21 Last OV 07/15/21

## 2022-01-02 ENCOUNTER — Telehealth: Payer: Self-pay | Admitting: Family Medicine

## 2022-01-02 NOTE — Telephone Encounter (Signed)
Left message for patient to call back and schedule Medicare Annual Wellness Visit (AWV).   Please offer to do virtually or by telephone.   Last AWV:11/10/2019  Please schedule at anytime with [facility] schedule 2  45 minute appointent  If any questions, please contact me at [phone]

## 2022-02-01 ENCOUNTER — Other Ambulatory Visit: Payer: Self-pay | Admitting: Family Medicine

## 2022-02-02 NOTE — Telephone Encounter (Signed)
Last filled 12/11/21 Last ov 07/15/21

## 2022-02-23 DIAGNOSIS — Z1231 Encounter for screening mammogram for malignant neoplasm of breast: Secondary | ICD-10-CM | POA: Diagnosis not present

## 2022-03-06 DIAGNOSIS — R928 Other abnormal and inconclusive findings on diagnostic imaging of breast: Secondary | ICD-10-CM | POA: Diagnosis not present

## 2022-03-06 DIAGNOSIS — R921 Mammographic calcification found on diagnostic imaging of breast: Secondary | ICD-10-CM | POA: Diagnosis not present

## 2022-03-06 DIAGNOSIS — R922 Inconclusive mammogram: Secondary | ICD-10-CM | POA: Diagnosis not present

## 2022-03-12 ENCOUNTER — Encounter: Payer: Self-pay | Admitting: Family Medicine

## 2022-03-12 ENCOUNTER — Other Ambulatory Visit: Payer: Self-pay

## 2022-03-12 DIAGNOSIS — D0511 Intraductal carcinoma in situ of right breast: Secondary | ICD-10-CM | POA: Diagnosis not present

## 2022-03-17 ENCOUNTER — Ambulatory Visit: Payer: Self-pay | Admitting: General Surgery

## 2022-03-17 DIAGNOSIS — D0511 Intraductal carcinoma in situ of right breast: Secondary | ICD-10-CM

## 2022-03-19 ENCOUNTER — Telehealth: Payer: Self-pay | Admitting: Hematology and Oncology

## 2022-03-19 ENCOUNTER — Telehealth: Payer: Self-pay | Admitting: Genetic Counselor

## 2022-03-19 ENCOUNTER — Encounter: Payer: Self-pay | Admitting: *Deleted

## 2022-03-19 DIAGNOSIS — D0511 Intraductal carcinoma in situ of right breast: Secondary | ICD-10-CM

## 2022-03-19 NOTE — Telephone Encounter (Signed)
Scheduled appt per 9/14 referral. Pt is aware of appt date and time. Pt is aware to arrive 15 mins prior to appt time and to bring and updated insurance card. Pt is aware of appt location.   

## 2022-03-19 NOTE — Progress Notes (Signed)
Radiation Oncology         [phone] ________________________________  Initial Outpatient Consultation  Name: Taylor Ewing MRN: [id number]  Date: 03/20/2022  DOB: 02-Mar-1951  CC:Tower, Wynelle Fanny, MD  Jovita Kussmaul, MD   REFERRING PHYSICIAN: [name], MD  DIAGNOSIS: No diagnosis found.  Stage 0 (cTis (DCIS), cN0, cM0) Right Breast LIQ, Intermediate grade DCIS, ER+ / PR+ / Her2 not assessed  CHIEF COMPLAINT: Here to discuss management of right breast DCIS  HISTORY OF PRESENT ILLNESS::Taylor Ewing is a 71 y.o. female who presented with a right breast abnormality on the following imaging: bilateral screening mammogram on the date of 02/23/22.  No symptoms, if any, were reported at that time.   Right breast diagnostic mammogram on 03/06/22 further revealed punctate calcifications in the right breast suspicious of malignancy, measuring approximately 0.3 cm. No abnormal right axillary lymph nodes were appreciated, however a targeted sonogram was not performed.     Biopsy of the lower inner right breast on date of 03/12/22 showed intermediate grade ductal carcinoma in-situ with necrosis measuring 0.5 cm in the greatest linear extent of the sample.  ER status: 95% positive and PR status 95% positive, both with strong staining intensity, Her2 not assessed. No lymph nodes were examined.   Accordingly, the patient was referred to Dr. Marlou Starks on 03/17/22 to discuss surgical treatment options. Breast exam performed during this visit was negative for any palpable mass, or palpable axillary, supraclavicular, or cervical lymphadenopathy. Following discussion of the risks and benefits, the patient agreed to proceed with right breast conserving surgery. She is scheduled for surgery on 04/01/22.  Of note: the patient has a strong family history of breast cancer in a maternal great-grandmother, maternal grandmother, mother, and first cousin.  ***  PREVIOUS RADIATION THERAPY: No  PAST MEDICAL  HISTORY:  has a past medical history of Allergy, Anxiety, Asthma, Cancer ([place]), Essential hypertension, Insomnia, Kidney stones (03/02/2012), Migraines, and TMJ (dislocation of temporomandibular joint).    PAST SURGICAL HISTORY: Past Surgical History:  Procedure Laterality Date   HEMORRHOID SURGERY  11/2003   SKIN LESION EXCISION     TUBAL LIGATION      FAMILY HISTORY: family history includes Breast cancer in her mother; Cancer in her maternal grandfather, maternal grandmother, and sister; Colon polyps in her daughter; Depression in her father and mother; Heart attack in her maternal grandmother; Hyperlipidemia in her brother and sister; Nephrolithiasis in her daughter.  SOCIAL HISTORY:  reports that she has never smoked. She has never used smokeless tobacco. She reports current alcohol use. She reports that she does not use drugs.  ALLERGIES: Patient has no known allergies.  MEDICATIONS:  Current Outpatient Medications  Medication Sig Dispense Refill   albuterol (PROVENTIL HFA;VENTOLIN HFA) 108 (90 Base) MCG/ACT inhaler INHALE TWO PUFFS BY MOUTH EVERY 4 HOURS AS NEEDED FOR  WHEEZE 9 g 1   Cetirizine HCl 10 MG TBDP Take 1 tablet by mouth daily.     cholecalciferol (VITAMIN D3) 25 MCG (1000 UNIT) tablet Take 2,000 Units by mouth daily.     famotidine (PEPCID) 20 MG tablet TAKE 1 TABLET BY MOUTH AT BEDTIME 90 tablet 1   losartan (COZAAR) 100 MG tablet Take 1 tablet (100 mg total) by mouth daily. 90 tablet 3   Multiple Vitamin (MULTIVITAMIN) tablet Take 1 tablet by mouth daily.     Omega-3 Fatty Acids (FISH OIL) 1200 MG CPDR Take by mouth. Patient states she takes 1236m ( 2 tabs) daily.  sertraline (ZOLOFT) 50 MG tablet Take 1 tablet (50 mg total) by mouth daily. 90 tablet 3   simvastatin (ZOCOR) 40 MG tablet Take 1 tablet (40 mg total) by mouth daily. 90 tablet 3   zolpidem (AMBIEN) 10 MG tablet TAKE 1 TABLET BY MOUTH AT BEDTIME AS NEEDED FOR SLEEP 30 tablet 0   No current  facility-administered medications for this encounter.    REVIEW OF SYSTEMS: As above in HPI.   PHYSICAL EXAM:  vitals were not taken for this visit.   General: Alert and oriented, in no acute distress HEENT: Head is normocephalic. Extraocular movements are intact. Oropharynx is clear. Neck: Neck is supple, no palpable cervical or supraclavicular lymphadenopathy. Heart: Regular in rate and rhythm with no murmurs, rubs, or gallops. Chest: Clear to auscultation bilaterally, with no rhonchi, wheezes, or rales. Abdomen: Soft, nontender, nondistended, with no rigidity or guarding. Extremities: No cyanosis or edema. Lymphatics: see Neck Exam Skin: No concerning lesions. Musculoskeletal: symmetric strength and muscle tone throughout. Neurologic: Cranial nerves II through XII are grossly intact. No obvious focalities. Speech is fluent. Coordination is intact. Psychiatric: Judgment and insight are intact. Affect is appropriate. Breasts: *** . No other palpable masses appreciated in the breasts or axillae *** .    ECOG = ***  0 - Asymptomatic (Fully active, able to carry on all predisease activities without restriction)  1 - Symptomatic but completely ambulatory (Restricted in physically strenuous activity but ambulatory and able to carry out work of a light or sedentary nature. For example, light housework, office work)  2 - Symptomatic, <50% in bed during the day (Ambulatory and capable of all self care but unable to carry out any work activities. Up and about more than 50% of waking hours)  3 - Symptomatic, >50% in bed, but not bedbound (Capable of only limited self-care, confined to bed or chair 50% or more of waking hours)  4 - Bedbound (Completely disabled. Cannot carry on any self-care. Totally confined to bed or chair)  5 - Death   Eustace Pen MM, Creech RH, Tormey DC, et al. [phone]). "Toxicity and response criteria of the [facility]". [place] Oncol. 5 (6):  649-55   LABORATORY DATA:  Lab Results  Component Value Date   WBC 5.2 07/15/2021   HGB 13.7 07/15/2021   HCT 41.0 07/15/2021   MCV 90.1 07/15/2021   PLT 290.0 07/15/2021   CMP     Component Value Date/Time   NA 138 07/15/2021 1054   K 4.0 07/15/2021 1054   CL 105 07/15/2021 1054   CO2 27 07/15/2021 1054   GLUCOSE 98 07/15/2021 1054   BUN 15 07/15/2021 1054   CREATININE 0.75 07/15/2021 1054   CREATININE 0.78 03/22/2020 1550   CALCIUM 9.7 07/15/2021 1054   PROT 7.7 07/15/2021 1054   ALBUMIN 4.9 07/15/2021 1054   AST 26 07/15/2021 1054   ALT 32 07/15/2021 1054   ALKPHOS 83 07/15/2021 1054   BILITOT 0.5 07/15/2021 1054   GFRNONAA 91.78 09/26/2009 1108         RADIOGRAPHY: No results found.    IMPRESSION/PLAN: ***   It was a pleasure meeting the patient today. We discussed the risks, benefits, and side effects of radiotherapy. I recommend radiotherapy to the *** to reduce her risk of locoregional recurrence by 2/3.  We discussed that radiation would take approximately *** weeks to complete and that I would give the patient a few weeks to heal following surgery before starting treatment  planning. *** If chemotherapy were to be given, this would precede radiotherapy. We spoke about acute effects including skin irritation and fatigue as well as much less common late effects including internal organ injury or irritation. We spoke about the latest technology that is used to minimize the risk of late effects for patients undergoing radiotherapy to the breast or chest wall. No guarantees of treatment were given. The patient is enthusiastic about proceeding with treatment. I look forward to participating in the patient's care.  I will await her referral back to me for postoperative follow-up and eventual CT simulation/treatment planning.  On date of service, in total, I spent *** minutes on this encounter. Patient was seen in person.    __________________________________________   Eppie Gibson, MD  This document serves as a record of services personally performed by Eppie Gibson, MD. It was created on her behalf by Roney Mans, a trained medical scribe. The creation of this record is based on the scribe's personal observations and the provider's statements to them. This document has been checked and approved by the attending provider.

## 2022-03-19 NOTE — Progress Notes (Signed)
Location of Breast Cancer:lower inner quadrant of the right breast  Histology per Pathology Report:   03/12/2022 Diagnosis Breast, right, needle core biopsy, LIQ - DUCTAL CARCINOMA IN SITU, INTERMEDIATE GRADE - NECROSIS: PRESENT - CALCIFICATIONS: NOT IDENTIFIED - DCIS LENGTH: 0.5 CM  Receptor Status: ER(95%), PR (95%)  Did patient present with symptoms (if so, please note symptoms) or was this found on screening mammography?: screening mammogram  Past/Anticipated interventions by surgeon, if any: Right lumpectomy with radioactive seed localization planned for 04/01/22 with Dr. Marlou Starks.  Past/Anticipated interventions by medical oncology, if any: Appointment with Dr. Chryl Heck on 04/10/2022  Lymphedema issues, if any:  {:18581} {t:21944}   Pain issues, if any:  {:18581} {PAIN DESCRIPTION:21022940}  SAFETY ISSUES: Prior radiation? {:18581} Pacemaker/ICD? {:18581} Possible current pregnancy?no Is the patient on methotrexate? {:18581}  Current Complaints / other details:  ***    Jacqulyn Liner, RN 03/19/2022,2:05 PM

## 2022-03-20 ENCOUNTER — Encounter: Payer: Self-pay | Admitting: Radiation Oncology

## 2022-03-20 ENCOUNTER — Ambulatory Visit
Admission: RE | Admit: 2022-03-20 | Discharge: 2022-03-20 | Disposition: A | Discharge status: Home / Self Care | Payer: PPO | Source: Ambulatory Visit | Attending: Radiation Oncology | Admitting: Radiation Oncology

## 2022-03-20 ENCOUNTER — Other Ambulatory Visit: Payer: Self-pay

## 2022-03-20 VITALS — BP 146/91 | HR 83 | Temp 98.2°F | Resp 20 | Ht 64.0 in | Wt 143.4 lb

## 2022-03-20 DIAGNOSIS — Z803 Family history of malignant neoplasm of breast: Secondary | ICD-10-CM | POA: Diagnosis not present

## 2022-03-20 DIAGNOSIS — D0511 Intraductal carcinoma in situ of right breast: Secondary | ICD-10-CM

## 2022-03-20 DIAGNOSIS — G4700 Insomnia, unspecified: Secondary | ICD-10-CM | POA: Diagnosis not present

## 2022-03-20 DIAGNOSIS — Z79899 Other long term (current) drug therapy: Secondary | ICD-10-CM | POA: Insufficient documentation

## 2022-03-20 DIAGNOSIS — J45909 Unspecified asthma, uncomplicated: Secondary | ICD-10-CM | POA: Diagnosis not present

## 2022-03-20 DIAGNOSIS — F419 Anxiety disorder, unspecified: Secondary | ICD-10-CM | POA: Insufficient documentation

## 2022-03-20 DIAGNOSIS — Z170 Estrogen receptor positive status [ER+]: Secondary | ICD-10-CM | POA: Diagnosis not present

## 2022-03-20 DIAGNOSIS — Z853 Personal history of malignant neoplasm of breast: Secondary | ICD-10-CM | POA: Insufficient documentation

## 2022-03-20 HISTORY — DX: Malignant neoplasm of unspecified site of unspecified female breast: C50.919

## 2022-03-23 ENCOUNTER — Inpatient Hospital Stay
Admission: RE | Admit: 2022-03-23 | Discharge: 2022-03-23 | Disposition: A | Discharge status: Home / Self Care | Payer: Self-pay | Source: Ambulatory Visit | Attending: Radiation Oncology | Admitting: Radiation Oncology

## 2022-03-23 ENCOUNTER — Other Ambulatory Visit: Payer: Self-pay | Admitting: Radiation Oncology

## 2022-03-23 ENCOUNTER — Encounter (HOSPITAL_BASED_OUTPATIENT_CLINIC_OR_DEPARTMENT_OTHER): Payer: Self-pay | Admitting: General Surgery

## 2022-03-23 DIAGNOSIS — D0511 Intraductal carcinoma in situ of right breast: Secondary | ICD-10-CM

## 2022-03-25 ENCOUNTER — Other Ambulatory Visit: Payer: Self-pay | Admitting: General Surgery

## 2022-03-25 ENCOUNTER — Telehealth: Payer: Self-pay | Admitting: Radiation Oncology

## 2022-03-25 DIAGNOSIS — D0511 Intraductal carcinoma in situ of right breast: Secondary | ICD-10-CM

## 2022-03-25 NOTE — Telephone Encounter (Signed)
9/20 @ 4:20 pm Left voicemail for patient to call our office.

## 2022-03-26 ENCOUNTER — Encounter: Payer: PPO | Attending: Nurse Practitioner | Admitting: Nutrition

## 2022-03-26 ENCOUNTER — Encounter: Payer: Self-pay | Admitting: *Deleted

## 2022-03-26 NOTE — Progress Notes (Signed)
Information for weight/ht was put in on wrong patient.

## 2022-03-30 NOTE — Progress Notes (Signed)

## 2022-03-31 DIAGNOSIS — D0511 Intraductal carcinoma in situ of right breast: Secondary | ICD-10-CM | POA: Diagnosis not present

## 2022-04-01 ENCOUNTER — Other Ambulatory Visit: Payer: Self-pay

## 2022-04-01 ENCOUNTER — Encounter (HOSPITAL_BASED_OUTPATIENT_CLINIC_OR_DEPARTMENT_OTHER)
Admission: RE | Disposition: A | Discharge status: Home / Self Care | Payer: Self-pay | Source: Home / Self Care | Attending: General Surgery

## 2022-04-01 ENCOUNTER — Ambulatory Visit (HOSPITAL_BASED_OUTPATIENT_CLINIC_OR_DEPARTMENT_OTHER): Payer: PPO | Admitting: Certified Registered"

## 2022-04-01 ENCOUNTER — Ambulatory Visit (HOSPITAL_BASED_OUTPATIENT_CLINIC_OR_DEPARTMENT_OTHER)
Admission: RE | Admit: 2022-04-01 | Discharge: 2022-04-01 | Disposition: A | Discharge status: Home / Self Care | Payer: PPO | Attending: General Surgery | Admitting: General Surgery

## 2022-04-01 ENCOUNTER — Encounter (HOSPITAL_BASED_OUTPATIENT_CLINIC_OR_DEPARTMENT_OTHER): Payer: Self-pay | Admitting: General Surgery

## 2022-04-01 DIAGNOSIS — I10 Essential (primary) hypertension: Secondary | ICD-10-CM | POA: Insufficient documentation

## 2022-04-01 DIAGNOSIS — F419 Anxiety disorder, unspecified: Secondary | ICD-10-CM | POA: Insufficient documentation

## 2022-04-01 DIAGNOSIS — K219 Gastro-esophageal reflux disease without esophagitis: Secondary | ICD-10-CM | POA: Insufficient documentation

## 2022-04-01 DIAGNOSIS — Z803 Family history of malignant neoplasm of breast: Secondary | ICD-10-CM | POA: Diagnosis not present

## 2022-04-01 DIAGNOSIS — D0511 Intraductal carcinoma in situ of right breast: Secondary | ICD-10-CM

## 2022-04-01 DIAGNOSIS — Z170 Estrogen receptor positive status [ER+]: Secondary | ICD-10-CM | POA: Diagnosis not present

## 2022-04-01 DIAGNOSIS — C50911 Malignant neoplasm of unspecified site of right female breast: Secondary | ICD-10-CM | POA: Diagnosis not present

## 2022-04-01 HISTORY — PX: BREAST LUMPECTOMY WITH RADIOACTIVE SEED LOCALIZATION: SHX6424

## 2022-04-01 SURGERY — BREAST LUMPECTOMY WITH RADIOACTIVE SEED LOCALIZATION
Anesthesia: General | Site: Breast | Laterality: Right

## 2022-04-01 MED ORDER — MIDAZOLAM HCL 2 MG/2ML IJ SOLN
INTRAMUSCULAR | Status: AC
  Filled 2022-04-01: qty 2

## 2022-04-01 MED ORDER — MIDAZOLAM HCL 5 MG/5ML IJ SOLN
INTRAMUSCULAR | Status: DC | PRN
  Administered 2022-04-01: 2 mg via INTRAVENOUS

## 2022-04-01 MED ORDER — ONDANSETRON HCL 4 MG/2ML IJ SOLN
INTRAMUSCULAR | Status: DC | PRN
  Administered 2022-04-01: 4 mg via INTRAVENOUS

## 2022-04-01 MED ORDER — ACETAMINOPHEN 500 MG PO TABS
1000.0000 mg | ORAL_TABLET | ORAL | Status: AC
  Administered 2022-04-01: 1000 mg via ORAL

## 2022-04-01 MED ORDER — ONDANSETRON HCL 4 MG/2ML IJ SOLN: 4.0000 mg | Freq: Once | INTRAMUSCULAR | Status: DC | PRN

## 2022-04-01 MED ORDER — GABAPENTIN 300 MG PO CAPS
ORAL_CAPSULE | ORAL | Status: AC
  Filled 2022-04-01: qty 1

## 2022-04-01 MED ORDER — FENTANYL CITRATE (PF) 100 MCG/2ML IJ SOLN
INTRAMUSCULAR | Status: AC
  Filled 2022-04-01: qty 2

## 2022-04-01 MED ORDER — LACTATED RINGERS IV SOLN: INTRAVENOUS | Status: DC | PRN

## 2022-04-01 MED ORDER — ONDANSETRON HCL 4 MG/2ML IJ SOLN
INTRAMUSCULAR | Status: AC
  Filled 2022-04-01: qty 2

## 2022-04-01 MED ORDER — CHLORHEXIDINE GLUCONATE CLOTH 2 % EX PADS: 6.0000 | MEDICATED_PAD | Freq: Once | CUTANEOUS | Status: DC

## 2022-04-01 MED ORDER — LIDOCAINE HCL (CARDIAC) PF 100 MG/5ML IV SOSY
PREFILLED_SYRINGE | INTRAVENOUS | Status: DC | PRN
  Administered 2022-04-01: 60 mg via INTRAVENOUS

## 2022-04-01 MED ORDER — PHENYLEPHRINE 80 MCG/ML (10ML) SYRINGE FOR IV PUSH (FOR BLOOD PRESSURE SUPPORT)
PREFILLED_SYRINGE | INTRAVENOUS | Status: AC
  Filled 2022-04-01: qty 20

## 2022-04-01 MED ORDER — DEXAMETHASONE SODIUM PHOSPHATE 10 MG/ML IJ SOLN
INTRAMUSCULAR | Status: DC | PRN
  Administered 2022-04-01: 10 mg via INTRAVENOUS

## 2022-04-01 MED ORDER — DEXAMETHASONE SODIUM PHOSPHATE 10 MG/ML IJ SOLN
INTRAMUSCULAR | Status: AC
  Filled 2022-04-01: qty 1

## 2022-04-01 MED ORDER — FENTANYL CITRATE (PF) 100 MCG/2ML IJ SOLN
INTRAMUSCULAR | Status: DC | PRN
  Administered 2022-04-01: 25 ug via INTRAVENOUS

## 2022-04-01 MED ORDER — CEFAZOLIN SODIUM-DEXTROSE 2-4 GM/100ML-% IV SOLN
2.0000 g | INTRAVENOUS | Status: AC
  Administered 2022-04-01: 2 g via INTRAVENOUS

## 2022-04-01 MED ORDER — PROPOFOL 10 MG/ML IV BOLUS
INTRAVENOUS | Status: AC
  Filled 2022-04-01: qty 20

## 2022-04-01 MED ORDER — GABAPENTIN 300 MG PO CAPS
300.0000 mg | ORAL_CAPSULE | ORAL | Status: AC
  Administered 2022-04-01: 300 mg via ORAL

## 2022-04-01 MED ORDER — CELECOXIB 200 MG PO CAPS
200.0000 mg | ORAL_CAPSULE | ORAL | Status: AC
  Administered 2022-04-01: 200 mg via ORAL

## 2022-04-01 MED ORDER — LIDOCAINE 2% (20 MG/ML) 5 ML SYRINGE
INTRAMUSCULAR | Status: AC
  Filled 2022-04-01: qty 5

## 2022-04-01 MED ORDER — EPHEDRINE SULFATE (PRESSORS) 50 MG/ML IJ SOLN
INTRAMUSCULAR | Status: DC | PRN
  Administered 2022-04-01 (×3): 5 mg via INTRAVENOUS

## 2022-04-01 MED ORDER — CELECOXIB 200 MG PO CAPS
ORAL_CAPSULE | ORAL | Status: AC
  Filled 2022-04-01: qty 1

## 2022-04-01 MED ORDER — PROPOFOL 10 MG/ML IV BOLUS
INTRAVENOUS | Status: DC | PRN
  Administered 2022-04-01: 150 mg via INTRAVENOUS

## 2022-04-01 MED ORDER — BUPIVACAINE-EPINEPHRINE (PF) 0.25% -1:200000 IJ SOLN
INTRAMUSCULAR | Status: DC | PRN
  Administered 2022-04-01: 20 mL

## 2022-04-01 MED ORDER — OXYCODONE HCL 5 MG PO TABS: 5.0000 mg | ORAL_TABLET | Freq: Four times a day (QID) | ORAL | 0 refills | Status: DC | PRN

## 2022-04-01 MED ORDER — PHENYLEPHRINE HCL (PRESSORS) 10 MG/ML IV SOLN
INTRAVENOUS | Status: DC | PRN
  Administered 2022-04-01: 80 ug via INTRAVENOUS

## 2022-04-01 MED ORDER — FENTANYL CITRATE (PF) 100 MCG/2ML IJ SOLN: 25.0000 ug | INTRAMUSCULAR | Status: DC | PRN

## 2022-04-01 MED ORDER — CEFAZOLIN SODIUM-DEXTROSE 2-4 GM/100ML-% IV SOLN
INTRAVENOUS | Status: AC
  Filled 2022-04-01: qty 100

## 2022-04-01 MED ORDER — ACETAMINOPHEN 500 MG PO TABS
ORAL_TABLET | ORAL | Status: AC
  Filled 2022-04-01: qty 2

## 2022-04-01 SURGICAL SUPPLY — 41 items
ADH SKN CLS APL DERMABOND .7 (GAUZE/BANDAGES/DRESSINGS) ×1
APL PRP STRL LF DISP 70% ISPRP (MISCELLANEOUS) ×1
APPLIER CLIP 9.375 MED OPEN (MISCELLANEOUS) ×1
APR CLP MED 9.3 20 MLT OPN (MISCELLANEOUS) ×1
BLADE SURG 15 STRL LF DISP TIS (BLADE) ×2 IMPLANT
BLADE SURG 15 STRL SS (BLADE) ×1
CANISTER SUC SOCK COL 7IN (MISCELLANEOUS) ×2 IMPLANT
CANISTER SUCT 1200ML W/VALVE (MISCELLANEOUS) ×2 IMPLANT
CHLORAPREP W/TINT 26 (MISCELLANEOUS) ×2 IMPLANT
CLIP APPLIE 9.375 MED OPEN (MISCELLANEOUS) IMPLANT
COVER BACK TABLE 60X90IN (DRAPES) ×2 IMPLANT
COVER MAYO STAND STRL (DRAPES) ×2 IMPLANT
COVER PROBE W GEL 5X96 (DRAPES) ×2 IMPLANT
DERMABOND ADVANCED .7 DNX12 (GAUZE/BANDAGES/DRESSINGS) ×2 IMPLANT
DRAPE LAPAROSCOPIC ABDOMINAL (DRAPES) ×2 IMPLANT
DRAPE UTILITY XL STRL (DRAPES) ×2 IMPLANT
ELECT COATED BLADE 2.86 ST (ELECTRODE) ×2 IMPLANT
ELECT REM PT RETURN 9FT ADLT (ELECTROSURGICAL) ×1
ELECTRODE REM PT RTRN 9FT ADLT (ELECTROSURGICAL) ×2 IMPLANT
GLOVE BIO SURGEON STRL SZ7.5 (GLOVE) ×4 IMPLANT
GOWN STRL REUS W/ TWL LRG LVL3 (GOWN DISPOSABLE) ×4 IMPLANT
GOWN STRL REUS W/TWL LRG LVL3 (GOWN DISPOSABLE) ×2
ILLUMINATOR WAVEGUIDE N/F (MISCELLANEOUS) IMPLANT
KIT MARKER MARGIN INK (KITS) ×2 IMPLANT
LIGHT WAVEGUIDE WIDE FLAT (MISCELLANEOUS) IMPLANT
NDL HYPO 25X1 1.5 SAFETY (NEEDLE) IMPLANT
NEEDLE HYPO 25X1 1.5 SAFETY (NEEDLE) IMPLANT
NS IRRIG 1000ML POUR BTL (IV SOLUTION) IMPLANT
PACK BASIN DAY SURGERY FS (CUSTOM PROCEDURE TRAY) ×2 IMPLANT
PENCIL SMOKE EVACUATOR (MISCELLANEOUS) ×2 IMPLANT
SLEEVE SCD COMPRESS KNEE MED (STOCKING) ×2 IMPLANT
SPIKE FLUID TRANSFER (MISCELLANEOUS) IMPLANT
SPONGE T-LAP 18X18 ~~LOC~~+RFID (SPONGE) ×2 IMPLANT
SUT MON AB 4-0 PC3 18 (SUTURE) ×2 IMPLANT
SUT SILK 2 0 SH (SUTURE) IMPLANT
SUT VICRYL 3-0 CR8 SH (SUTURE) ×2 IMPLANT
SYR CONTROL 10ML LL (SYRINGE) IMPLANT
TOWEL GREEN STERILE FF (TOWEL DISPOSABLE) ×2 IMPLANT
TRAY FAXITRON CT DISP (TRAY / TRAY PROCEDURE) ×2 IMPLANT
TUBE CONNECTING 20X1/4 (TUBING) ×2 IMPLANT
YANKAUER SUCT BULB TIP NO VENT (SUCTIONS) IMPLANT

## 2022-04-01 NOTE — Transfer of Care (Signed)
Immediate Anesthesia Transfer of Care Note  Patient: Taylor Ewing  Procedure(s) Performed: RIGHT BREAST LUMPECTOMY WITH RADIOACTIVE SEED LOCALIZATION (Right: Breast)  Patient Location: PACU  Anesthesia Type:General  Level of Consciousness: drowsy  Airway & Oxygen Therapy: Patient Spontanous Breathing and Patient connected to face mask oxygen  Post-op Assessment: Report given to RN and Post -op Vital signs reviewed and stable  Post vital signs: Reviewed and stable  Last Vitals:   BP: 129/63 (80) RR; 17 Vitals Value Taken Time  BP    Temp    Pulse 88 04/01/22 1308  Resp    SpO2 100 % 04/01/22 1308  Vitals shown include unvalidated device data.  Last Pain:  Vitals:   04/01/22 1117  TempSrc: Oral  PainSc: 0-No pain      Patients Stated Pain Goal: 3 (41/28/20 8138)  Complications: No notable events documented.

## 2022-04-01 NOTE — Anesthesia Postprocedure Evaluation (Signed)
Anesthesia Post Note  Patient: Taylor Ewing  Procedure(s) Performed: RIGHT BREAST LUMPECTOMY WITH RADIOACTIVE SEED LOCALIZATION (Right: Breast)     Patient location during evaluation: PACU Anesthesia Type: General Level of consciousness: awake and alert Pain management: pain level controlled Vital Signs Assessment: post-procedure vital signs reviewed and stable Respiratory status: spontaneous breathing, nonlabored ventilation, respiratory function stable and patient connected to nasal cannula oxygen Cardiovascular status: blood pressure returned to baseline and stable Postop Assessment: no apparent nausea or vomiting Anesthetic complications: no   No notable events documented.  Last Vitals:  Vitals:   04/01/22 1330 04/01/22 1343  BP: (!) 146/70 (!) 150/62  Pulse: 97 98  Resp: 17 20  Temp: 36.5 C 36.7 C  SpO2: 97% 96%    Last Pain:  Vitals:   04/01/22 1343  TempSrc:   PainSc: 0-No pain                 Santa Lighter

## 2022-04-01 NOTE — Anesthesia Preprocedure Evaluation (Addendum)
Anesthesia Evaluation  Patient identified by MRN, date of birth, ID band Patient awake    Reviewed: Allergy & Precautions, NPO status , Patient's Chart, lab work & pertinent test results  History of Anesthesia Complications Negative for: history of anesthetic complications  Airway Mallampati: II  TM Distance: <3 FB Neck ROM: Full    Dental  (+) Teeth Intact, Dental Advisory Given   Pulmonary asthma ,    Pulmonary exam normal breath sounds clear to auscultation       Cardiovascular hypertension, Pt. on medications Normal cardiovascular exam Rhythm:Regular Rate:Normal     Neuro/Psych  Headaches, PSYCHIATRIC DISORDERS Anxiety    GI/Hepatic Neg liver ROS, GERD  Medicated,  Endo/Other  negative endocrine ROS  Renal/GU negative Renal ROS     Musculoskeletal  (+) Arthritis , TMJ    Abdominal   Peds  Hematology negative hematology ROS (+)   Anesthesia Other Findings Day of surgery medications reviewed with the patient.  RIGHT BREAST DCIS  Reproductive/Obstetrics                            Anesthesia Physical Anesthesia Plan  ASA: 2  Anesthesia Plan: General   Post-op Pain Management: Tylenol PO (pre-op)*, Gabapentin PO (pre-op)* and Celebrex PO (pre-op)*   Induction: Intravenous  PONV Risk Score and Plan: 3 and Dexamethasone and Ondansetron  Airway Management Planned: LMA  Additional Equipment:   Intra-op Plan:   Post-operative Plan: Extubation in OR  Informed Consent: I have reviewed the patients History and Physical, chart, labs and discussed the procedure including the risks, benefits and alternatives for the proposed anesthesia with the patient or authorized representative who has indicated his/her understanding and acceptance.     Dental advisory given  Plan Discussed with: CRNA  Anesthesia Plan Comments:         Anesthesia Quick Evaluation

## 2022-04-01 NOTE — H&P (Signed)
REFERRING PHYSICIAN: Claudina Lick, MD  PROVIDER: Landry Corporal, MD  MRN: [id number] DOB: [birth year] Subjective   Chief Complaint: Breast Cancer   History of Present Illness: Taylor Ewing is a 71 y.o. female who is seen today as an office consultation for evaluation of Breast Cancer .   We are asked to see the patient in consultation by Dr. Robbi Garter to evaluate her for a new right breast cancer. The patient is a 71 year old white female who recently went for a routine screening mammogram. At that time she was found to have a 3 mm area of calcification in the lower inner quadrant of the right breast. This was biopsied and came back as ductal carcinoma in situ that was ER and PR positive. She is otherwise in good health and does not smoke. She does have a fairly strong family history of breast cancer in a maternal great-grandmother, maternal grandmother, mother, and first cousin.  Review of Systems: A complete review of systems was obtained from the patient. I have reviewed this information and discussed as appropriate with the patient. See HPI as well for other ROS.  ROS   Medical History: Past Medical History:  Diagnosis Date  Anxiety  Asthma, unspecified asthma severity, unspecified whether complicated, unspecified whether persistent  History of cancer  Hypertension   Patient Active Problem List  Diagnosis  Ductal carcinoma in situ (DCIS) of right breast   Past Surgical History:  Procedure Laterality Date  LAPAROSCOPIC TUBAL LIGATION    No Known Allergies  Current Outpatient Medications on File Prior to Visit  Medication Sig Dispense Refill  cholecalciferol (VITAMIN D3) 1000 unit tablet Take by mouth  docosahexaenoic acid/epa (FISH OIL ORAL) Take by mouth  famotidine (PEPCID) 20 MG tablet Take 20 mg by mouth at bedtime  losartan (COZAAR) 100 MG tablet Take 100 mg by mouth once daily  multivitamin tablet Take 1 tablet by mouth once daily  sertraline (ZOLOFT) 50  MG tablet Take 50 mg by mouth once daily  simvastatin (ZOCOR) 40 MG tablet Take 40 mg by mouth once daily  zolpidem (AMBIEN) 10 mg tablet Take 10 mg by mouth at bedtime as needed for Sleep   No current facility-administered medications on file prior to visit.   History reviewed. No pertinent family history.   Social History   Tobacco Use  Smoking Status Never  Smokeless Tobacco Never    Social History   Socioeconomic History  Marital status: Divorced  Tobacco Use  Smoking status: Never  Smokeless tobacco: Never   Objective:   Vitals:  BP: (!) 164/84  Pulse: 105  Weight: 65.5 kg (144 lb 6.4 oz)  Height: 162.6 cm ('5\' 4"'$ )   Body mass index is 24.79 kg/m.  Physical Exam Vitals reviewed.  Constitutional:  General: She is not in acute distress. Appearance: Normal appearance.  HENT:  Head: Normocephalic and atraumatic.  Right Ear: External ear normal.  Left Ear: External ear normal.  Nose: Nose normal.  Mouth/Throat:  Mouth: Mucous membranes are moist.  Pharynx: Oropharynx is clear.  Eyes:  General: No scleral icterus. Extraocular Movements: Extraocular movements intact.  Conjunctiva/sclera: Conjunctivae normal.  Pupils: Pupils are equal, round, and reactive to light.  Cardiovascular:  Rate and Rhythm: Normal rate and regular rhythm.  Pulses: Normal pulses.  Heart sounds: Normal heart sounds.  Pulmonary:  Effort: Pulmonary effort is normal. No respiratory distress.  Breath sounds: Normal breath sounds.  Abdominal:  General: Bowel sounds are normal.  Palpations: Abdomen is soft.  Tenderness: There is no abdominal tenderness.  Musculoskeletal:  General: No swelling, tenderness or deformity. Normal range of motion.  Cervical back: Normal range of motion and neck supple.  Skin: General: Skin is warm and dry.  Coloration: Skin is not jaundiced.  Neurological:  General: No focal deficit present.  Mental Status: She is alert and oriented to person, place, and  time.  Psychiatric:  Mood and Affect: Mood normal.  Behavior: Behavior normal.    Breast: There is no palpable mass in either breast. There is no palpable axillary, supraclavicular, or cervical lymphadenopathy.  Labs, Imaging and Diagnostic Testing:  Assessment and Plan:   Diagnoses and all orders for this visit:  Ductal carcinoma in situ (DCIS) of right breast - Ambulatory Referral to Oncology-Medical - Ambulatory Referral to Radiation Oncology - Ambulatory Referral to Genetics - [facility] Case Posting Request; Future    The patient appears to have a 3 mm area of ductal carcinoma in situ in the lower inner quadrant of the right breast. I have discussed with her in detail the different options for treatment and at this point she favors breast conservation which I feel is very reasonable. She will not need a node evaluation. I have discussed with her in detail the risks and benefits of the operation as well as some of the technical aspects including the use of a radioactive seed for localization and she understands and wishes to proceed. I will go ahead and refer her to medical and radiation oncology to discuss adjuvant therapy. I will also refer her to genetics given her family history. We will begin surgical planning. She is also experiencing some lower abdominal pain. We will evaluate her with a CT scan of the abdomen and pelvis.

## 2022-04-01 NOTE — Anesthesia Procedure Notes (Signed)
Procedure Name: LMA Insertion Date/Time: 04/01/2022 12:12 PM  Performed by: Lavonia Dana, CRNAPre-anesthesia Checklist: Patient identified, Emergency Drugs available, Suction available and Patient being monitored Patient Re-evaluated:Patient Re-evaluated prior to induction Oxygen Delivery Method: Circle system utilized Preoxygenation: Pre-oxygenation with 100% oxygen Induction Type: IV induction Ventilation: Mask ventilation without difficulty LMA: LMA inserted LMA Size: 4.0 Number of attempts: 1 Airway Equipment and Method: Bite block Placement Confirmation: positive ETCO2 Tube secured with: Tape Dental Injury: Teeth and Oropharynx as per pre-operative assessment

## 2022-04-01 NOTE — Discharge Instructions (Addendum)
  Post Anesthesia Home Care Instructions  Activity: Get plenty of rest for the remainder of the day. A responsible individual must stay with you for 24 hours following the procedure.  For the next 24 hours, DO NOT: -Drive a car -Paediatric nurse -Drink alcoholic beverages -Take any medication unless instructed by your physician -Make any legal decisions or sign important papers.  Meals: Start with liquid foods such as gelatin or soup. Progress to regular foods as tolerated. Avoid greasy, spicy, heavy foods. If nausea and/or vomiting occur, drink only clear liquids until the nausea and/or vomiting subsides. Call your physician if vomiting continues.  Special Instructions/Symptoms: Your throat may feel dry or sore from the anesthesia or the breathing tube placed in your throat during surgery. If this causes discomfort, gargle with warm salt water. The discomfort should disappear within 24 hours.  If you had a scopolamine patch placed behind your ear for the management of post- operative nausea and/or vomiting:  1. The medication in the patch is effective for 72 hours, after which it should be removed.  Wrap patch in a tissue and discard in the trash. Wash hands thoroughly with soap and water. 2. You may remove the patch earlier than 72 hours if you experience unpleasant side effects which may include dry mouth, dizziness or visual disturbances. 3. Avoid touching the patch. Wash your hands with soap and water after contact with the patch.  No tylenol or ibuprofen until after 5:20 PM today.

## 2022-04-01 NOTE — Op Note (Signed)
04/01/2022  1:03 PM  PATIENT:  Taylor Ewing  71 y.o. female  PRE-OPERATIVE DIAGNOSIS:  RIGHT BREAST DCIS  POST-OPERATIVE DIAGNOSIS:  RIGHT BREAST DCIS  PROCEDURE:  Procedure(s): RIGHT BREAST LUMPECTOMY WITH RADIOACTIVE SEED LOCALIZATION (Right)  SURGEON:  Surgeon(s) and Role:    * Jovita Kussmaul, MD - Primary    * Maczis, Carlena Hurl, PA-C - Assisting  PHYSICIAN ASSISTANT:   ASSISTANTS: Puja Maczis, PA   ANESTHESIA:   local and general  EBL:  10 mL   BLOOD ADMINISTERED:none  DRAINS: none   LOCAL MEDICATIONS USED:  MARCAINE     SPECIMEN:  Source of Specimen:  right breast tissue  DISPOSITION OF SPECIMEN:  PATHOLOGY  COUNTS:  YES  TOURNIQUET:  * No tourniquets in log *  DICTATION: .Dragon Dictation  After informed consent was obtained the patient was brought to the operating room and placed in the supine position on the operating table.  After adequate induction of general anesthesia the patient's right breast was prepped with ChloraPrep, allowed to dry, and draped in usual sterile manner.  An appropriate timeout was performed.  Previously an I-125 seed was placed in the lower inner quadrant of the right breast to mark an area of ductal carcinoma in situ.  The neoprobe was set to I-125 in the area of radioactivity was readily identified.  The area around this was infiltrated with quarter percent Marcaine.  A curvilinear incision was made along the lower outer edge of the areola of the right breast with a 15 blade knife.  The incision was carried through the skin and subcutaneous tissue sharply with the electrocautery.  Dissection was then carried towards the radioactive seed under the direction of the neoprobe.  Once I more closely approached the radioactive seed I then removed a circular portion of breast tissue sharply with the electrocautery around the radioactive seed while checking the area of radioactivity frequently.  Once the specimen was removed it was oriented with  the appropriate paint colors.  A specimen radiograph was obtained that showed the clip and seed to be near the center of the specimen.  The specimen was then sent to pathology for further evaluation.  Hemostasis was achieved using the Bovie electrocautery.  The cavity was irrigated with saline and infiltrated with more quarter percent Marcaine.  The cavity was marked with clips.  The deep layer of the wound was then closed with layers of interrupted 3-0 Vicryl stitches.  The skin was then closed with interrupted 4-0 Monocryl subcuticular stitches.  Dermabond dressings were applied.  The patient tolerated the procedure well.  At the end of the case all needle sponge and instrument counts were correct.  The patient was then awakened and taken to recovery in stable condition.  PLAN OF CARE: Discharge to home after PACU  PATIENT DISPOSITION:  PACU - hemodynamically stable.   Delay start of Pharmacological VTE agent (>24hrs) due to surgical blood loss or risk of bleeding: not applicable

## 2022-04-01 NOTE — Interval H&P Note (Signed)
History and Physical Interval Note:  04/01/2022 11:51 AM  Taylor Ewing  has presented today for surgery, with the diagnosis of RIGHT BREAST DCIS.  The various methods of treatment have been discussed with the patient and family. After consideration of risks, benefits and other options for treatment, the patient has consented to  Procedure(s): RIGHT BREAST LUMPECTOMY WITH RADIOACTIVE SEED LOCALIZATION (Right) as a surgical intervention.  The patient's history has been reviewed, patient examined, no change in status, stable for surgery.  I have reviewed the patient's chart and labs.  Questions were answered to the patient's satisfaction.     [name]

## 2022-04-02 ENCOUNTER — Encounter (HOSPITAL_BASED_OUTPATIENT_CLINIC_OR_DEPARTMENT_OTHER): Payer: Self-pay | Admitting: General Surgery

## 2022-04-02 NOTE — Progress Notes (Signed)
Left message stating courtesy call and if any questions or concerns please call the doctors office.  

## 2022-04-06 ENCOUNTER — Ambulatory Visit: Payer: Self-pay | Admitting: General Surgery

## 2022-04-06 ENCOUNTER — Other Ambulatory Visit: Payer: Self-pay | Admitting: Family Medicine

## 2022-04-06 NOTE — Telephone Encounter (Signed)
Name of Medication: Ambien Name of Pharmacy: [facility] Date and Quantity: 02/02/22 #30 tabs/ 0 refills  Last Office Visit and Type: 07/15/21 CPE Next Office Visit and Type: none scheduled

## 2022-04-10 ENCOUNTER — Inpatient Hospital Stay: Payer: PPO | Attending: Hematology and Oncology | Admitting: Hematology and Oncology

## 2022-04-10 ENCOUNTER — Encounter (HOSPITAL_COMMUNITY): Payer: Self-pay | Admitting: General Surgery

## 2022-04-10 ENCOUNTER — Other Ambulatory Visit: Payer: Self-pay

## 2022-04-10 ENCOUNTER — Encounter: Payer: Self-pay | Admitting: Hematology and Oncology

## 2022-04-10 ENCOUNTER — Inpatient Hospital Stay: Payer: PPO

## 2022-04-10 DIAGNOSIS — D0511 Intraductal carcinoma in situ of right breast: Secondary | ICD-10-CM | POA: Diagnosis not present

## 2022-04-10 DIAGNOSIS — Z800 Family history of malignant neoplasm of digestive organs: Secondary | ICD-10-CM | POA: Insufficient documentation

## 2022-04-10 DIAGNOSIS — Z170 Estrogen receptor positive status [ER+]: Secondary | ICD-10-CM | POA: Insufficient documentation

## 2022-04-10 DIAGNOSIS — Z803 Family history of malignant neoplasm of breast: Secondary | ICD-10-CM | POA: Insufficient documentation

## 2022-04-10 NOTE — Progress Notes (Signed)
Taylor Ewing  Patient Care Team: Tower, Wynelle Fanny, MD as PCP - Philomena Doheny, Paulette Blanch, RN as Oncology Nurse Navigator Rockwell Germany, RN as Oncology Nurse Navigator  CHIEF COMPLAINTS/PURPOSE OF CONSULTATION:  Newly diagnosed breast cancer  HISTORY OF PRESENTING ILLNESS:  Taylor Ewing 71 y.o. female is here because of recent diagnosis of right breast DCIS  I reviewed her records extensively and collaborated the history with the patient.  SUMMARY OF ONCOLOGIC HISTORY: Oncology History  Ductal carcinoma in situ (DCIS) of right breast  02/23/2022 Mammogram   3D screening mammogram showed indeterminate right breast calcifications.   03/12/2022 Pathology Results   Pathology from the right breast lumpectomy showed intermediate grade DCIS, negative for invasive carcinoma.  Prognostic showed ER 95% positive strong staining PR 95% positive strong staining   03/20/2022 Initial Diagnosis   Ductal carcinoma in situ (DCIS) of right breast   04/01/2022 Definitive Surgery   She had right breast lumpectomy on September 27 which once again showed a 38 mm measuring DCIS, intermediate grade, foci of atypical ductal epithelium seen at the anterior margin and close proximity to DCIS.  Questionable positive margin.    Interval history Taylor Ewing is here for an initial visit by herself.  She is doing quite well since her last surgery.  At baseline, she has kidney stones, hypertension, otherwise healthy.  She is very active, retired as a Secondary school teacher.  She tells me that her mother her maternal grandmother and her maternal great-grandmother all had breast cancer and she is worried about her daughters. Rest of the pertinent 10 point ROS reviewed and negative  MEDICAL HISTORY:  Past Medical History:  Diagnosis Date   Allergy    Anxiety    Asthma    Breast cancer ([place])    right breast   Cancer ([place])    skin, basal cell   Essential hypertension    History of kidney  stones    Insomnia    Migraines    very rarely now, more prior to menopause   TMJ (dislocation of temporomandibular joint)     SURGICAL HISTORY: Past Surgical History:  Procedure Laterality Date   BREAST BIOPSY  2023   BREAST LUMPECTOMY WITH RADIOACTIVE SEED LOCALIZATION Right 04/01/2022   Procedure: RIGHT BREAST LUMPECTOMY WITH RADIOACTIVE SEED LOCALIZATION;  Surgeon: Jovita Kussmaul, MD;  Location: [facility];  Service: General;  Laterality: Right;   HAND SURGERY Left 2021   schwannoma removal   HEMORRHOID SURGERY  11/04/2003   SKIN LESION EXCISION     TUBAL LIGATION     Daughters are 93 and 6. HRT: None Birth control pills: None Age at menarche: 63 Age at first birth : 56   SOCIAL HISTORY: Social History   Socioeconomic History   Marital status: Married    Spouse name: Not on file   Number of children: 2   Years of education: Not on file   Highest education level: Not on file  Occupational History   Not on file  Tobacco Use   Smoking status: Never   Smokeless tobacco: Never  Vaping Use   Vaping Use: Never used  Substance and Sexual Activity   Alcohol use: Yes    Alcohol/week: 0.0 standard drinks of alcohol    Comment: 1-2 weekly   Drug use: No   Sexual activity: Yes  Other Topics Concern   Not on file  Social History Narrative   Not on file  Social Determinants of Health   Financial Resource Strain: Low Risk  (11/10/2019)   Overall Financial Resource Strain (CARDIA)    Difficulty of Paying Living Expenses: Not hard at all  Food Insecurity: No Food Insecurity (11/10/2019)   Hunger Vital Sign    Worried About Running Out of Food in the Last Year: Never true    Ran Out of Food in the Last Year: Never true  Transportation Needs: No Transportation Needs (11/10/2019)   PRAPARE - Hydrologist (Medical): No    Lack of Transportation (Non-Medical): No  Physical Activity: Sufficiently Active (11/10/2019)   Exercise Vital  Sign    Days of Exercise per Week: 5 days    Minutes of Exercise per Session: 60 min  Stress: No Stress Concern Present (11/10/2019)   [place]    Feeling of Stress : Only a little  Social Connections: Not on file  Intimate Partner Violence: Not At Risk (11/10/2019)   Humiliation, Afraid, Rape, and Kick questionnaire    Fear of Current or Ex-Partner: No    Emotionally Abused: No    Physically Abused: No    Sexually Abused: No    FAMILY HISTORY: Family History  Problem Relation Age of Onset   Depression Mother    Breast cancer Mother    Depression Father    Hyperlipidemia Sister    Cancer Sister        thyroid   Hyperlipidemia Brother    Cancer Maternal Grandmother        breast   Heart attack Maternal Grandmother    Cancer Maternal Grandfather        stomach   Nephrolithiasis Daughter    Colon polyps Daughter    Cancer Other        1st cousin, breast cancer    ALLERGIES:  has No Known Allergies.  MEDICATIONS:  Current Outpatient Medications  Medication Sig Dispense Refill   acetaminophen (TYLENOL) 500 MG tablet Take 500 mg by mouth every 6 (six) hours as needed for moderate pain.     albuterol (PROVENTIL HFA;VENTOLIN HFA) 108 (90 Base) MCG/ACT inhaler INHALE TWO PUFFS BY MOUTH EVERY 4 HOURS AS NEEDED FOR  WHEEZE 9 g 1   cetirizine (ZYRTEC) 10 MG tablet Take 1 tablet by mouth daily as needed (allergies).     Cholecalciferol (VITAMIN D) 50 MCG (2000 UT) CAPS Take 2,000 Units by mouth daily.     famotidine (PEPCID) 20 MG tablet TAKE 1 TABLET BY MOUTH AT BEDTIME 90 tablet 1   losartan (COZAAR) 100 MG tablet Take 1 tablet (100 mg total) by mouth daily. 90 tablet 3   Multiple Vitamin (MULTIVITAMIN) tablet Take 1 tablet by mouth daily.     Omega-3 Fatty Acids (FISH OIL PO) Take 1,400 mg by mouth daily.     oxyCODONE (ROXICODONE) 5 MG immediate release tablet Take 1 tablet (5 mg total) by mouth every 6 (six)  hours as needed for severe pain. 10 tablet 0   sertraline (ZOLOFT) 50 MG tablet Take 1 tablet (50 mg total) by mouth daily. 90 tablet 3   simvastatin (ZOCOR) 40 MG tablet Take 1 tablet (40 mg total) by mouth daily. 90 tablet 3   zolpidem (AMBIEN) 10 MG tablet TAKE 1 TABLET BY MOUTH AT BEDTIME AS NEEDED FOR SLEEP 30 tablet 2   No current facility-administered medications for this visit.    REVIEW OF SYSTEMS:   Constitutional: Denies fevers,  chills or abnormal night sweats Eyes: Denies blurriness of vision, double vision or watery eyes Ears, nose, mouth, throat, and face: Denies mucositis or sore throat Respiratory: Denies cough, dyspnea or wheezes Cardiovascular: Denies palpitation, chest discomfort or lower extremity swelling Gastrointestinal:  Denies nausea, heartburn or change in bowel habits Skin: Denies abnormal skin rashes Lymphatics: Denies new lymphadenopathy or easy bruising Neurological:Denies numbness, tingling or new weaknesses Behavioral/Psych: Mood is stable, no new changes  Breast: Denies any palpable lumps or discharge All other systems were reviewed with the patient and are negative.  PHYSICAL EXAMINATION: ECOG PERFORMANCE STATUS: 0 - Asymptomatic  Vitals:   04/10/22 1000  BP: 132/79  Pulse: 83  Resp: 16  Temp: 97.9 F (36.6 C)  SpO2: 98%   Filed Weights   04/10/22 1000  Weight: 143 lb 3.2 oz (65 kg)   Physical exam deferred today in lieu of counseling  LABORATORY DATA:  I have reviewed the data as listed Lab Results  Component Value Date   WBC 5.2 07/15/2021   HGB 13.7 07/15/2021   HCT 41.0 07/15/2021   MCV 90.1 07/15/2021   PLT 290.0 07/15/2021   Lab Results  Component Value Date   NA 138 07/15/2021   K 4.0 07/15/2021   CL 105 07/15/2021   CO2 27 07/15/2021    RADIOGRAPHIC STUDIES: I have personally reviewed the radiological reports and agreed with the findings in the report.  ASSESSMENT AND PLAN:  Ductal carcinoma in situ (DCIS) of  right breast This is a very pleasant 71 year old female patient with newly diagnosed DCIS, ER/PR positive status postlumpectomy, questionable anterior margin involvement hence rescheduled for another surgery on October 10 referred to medical oncology for additional recommendations.  We have discussed the following details about DCIS today.  Pathology review: I discussed with the patient the difference between DCIS and invasive breast cancer. It is considered a precancerous lesion. DCIS is classified as a Stage 0 breast cancer. It is generally detected through mammograms as calcifications. We discussed the significance of grades and its impact on prognosis. We also discussed the importance of ER and PR receptors and their implications to adjuvant treatment options. Prognosis of DCIS dependence on grade and degree of comedo necrosis. It is anticipated that if not treated, 20-30% of DCIS can develop into invasive breast cancer.  Recommendation: 1. Breast conserving surgery, had surgery on September 27, reexcision scheduled for October 10 2. Followed by adjuvant radiation therapy 3. Followed by antiestrogen therapy with tamoxifen/aromatase inhibitors based on menopausal status 5 years  Tamoxifen counseling: We discussed the risks and benefits of tamoxifen. These include but not limited to insomnia, hot flashes, mood changes, vaginal dryness, and weight gain. Although rare, serious side effects including endometrial cancer, risk of blood clots were also discussed. We strongly believe that the benefits far outweigh the risks. Patient understands these risks and consented to starting treatment. Planned treatment duration is 5 years.  Aromatase inhibitors counseling: We have discussed the mechanism of action of aromatase inhibitors today.  We have discussed adverse effects including but not limited to menopausal symptoms, increased risk of osteoporosis and fractures, cardiovascular events, arthralgias and  myalgias.  We do believe that the benefits far outweigh the risks.  Plan treatment duration of 5 years.  She tells me that she is familiar with tamoxifen, her mom took tamoxifen and tolerated it very well.  She would like to think about these options.  She will return to clinic after radiation to discuss this further and to  initiate antiestrogen therapy.  Total time spent: 45 minutes including history, physical exam, review of records, counseling and coordination of care All questions were answered. The patient knows to call the clinic with any problems, questions or concerns.    Benay Pike, MD 04/10/22

## 2022-04-10 NOTE — Progress Notes (Signed)
Spoke with pt for pre-op call. Pt denies cardiac history or Diabetes. Pt is treated for HTN.   Pt had surgery at [facility] about 2 weeks ago, she states she has the CHG soap and instructions. I told her to follow the instructions for night before and day of surgery. She voiced understanding.

## 2022-04-10 NOTE — Assessment & Plan Note (Signed)
This is a very pleasant 71 year old female patient with newly diagnosed DCIS, ER/PR positive status postlumpectomy, questionable anterior margin involvement hence rescheduled for another surgery on October 10 referred to medical oncology for additional recommendations.  We have discussed the following details about DCIS today.  Pathology review: I discussed with the patient the difference between DCIS and invasive breast cancer. It is considered a precancerous lesion. DCIS is classified as a Stage 0 breast cancer. It is generally detected through mammograms as calcifications. We discussed the significance of grades and its impact on prognosis. We also discussed the importance of ER and PR receptors and their implications to adjuvant treatment options. Prognosis of DCIS dependence on grade and degree of comedo necrosis. It is anticipated that if not treated, 20-30% of DCIS can develop into invasive breast cancer.  Recommendation: 1. Breast conserving surgery, had surgery on September 27, reexcision scheduled for October 10 2. Followed by adjuvant radiation therapy 3. Followed by antiestrogen therapy with tamoxifen/aromatase inhibitors based on menopausal status 5 years  Tamoxifen counseling: We discussed the risks and benefits of tamoxifen. These include but not limited to insomnia, hot flashes, mood changes, vaginal dryness, and weight gain. Although rare, serious side effects including endometrial cancer, risk of blood clots were also discussed. We strongly believe that the benefits far outweigh the risks. Patient understands these risks and consented to starting treatment. Planned treatment duration is 5 years.  Aromatase inhibitors counseling: We have discussed the mechanism of action of aromatase inhibitors today.  We have discussed adverse effects including but not limited to menopausal symptoms, increased risk of osteoporosis and fractures, cardiovascular events, arthralgias and myalgias.  We do  believe that the benefits far outweigh the risks.  Plan treatment duration of 5 years.  She tells me that she is familiar with tamoxifen, her mom took tamoxifen and tolerated it very well.  She would like to think about these options.  She will return to clinic after radiation to discuss this further and to initiate antiestrogen therapy.

## 2022-04-13 ENCOUNTER — Encounter: Payer: Self-pay | Admitting: *Deleted

## 2022-04-13 NOTE — Anesthesia Preprocedure Evaluation (Signed)
Anesthesia Evaluation  Patient identified by MRN, date of birth, ID band Patient awake    Reviewed: Allergy & Precautions, NPO status   Airway Mallampati: II       Dental   Pulmonary asthma    breath sounds clear to auscultation       Cardiovascular hypertension,  Rhythm:Regular Rate:Normal     Neuro/Psych  Headaches PSYCHIATRIC DISORDERS         GI/Hepatic Neg liver ROS,,,  Endo/Other  negative endocrine ROS    Renal/GU negative Renal ROS     Musculoskeletal   Abdominal   Peds  Hematology   Anesthesia Other Findings   Reproductive/Obstetrics                             Anesthesia Physical Anesthesia Plan  ASA: 3  Anesthesia Plan: General   Post-op Pain Management:    Induction:   PONV Risk Score and Plan: 3 and Ondansetron, Dexamethasone and Midazolam  Airway Management Planned: LMA  Additional Equipment:   Intra-op Plan:   Post-operative Plan: Extubation in OR  Informed Consent: I have reviewed the patients History and Physical, chart, labs and discussed the procedure including the risks, benefits and alternatives for the proposed anesthesia with the patient or authorized representative who has indicated his/her understanding and acceptance.     Dental advisory given  Plan Discussed with: Anesthesiologist, CRNA and Surgeon  Anesthesia Plan Comments:         Anesthesia Quick Evaluation

## 2022-04-14 ENCOUNTER — Ambulatory Visit (HOSPITAL_BASED_OUTPATIENT_CLINIC_OR_DEPARTMENT_OTHER): Payer: PPO | Admitting: Anesthesiology

## 2022-04-14 ENCOUNTER — Encounter (HOSPITAL_COMMUNITY): Payer: Self-pay | Admitting: General Surgery

## 2022-04-14 ENCOUNTER — Encounter (HOSPITAL_COMMUNITY)
Admission: RE | Disposition: A | Discharge status: Home / Self Care | Payer: Self-pay | Source: Home / Self Care | Attending: General Surgery

## 2022-04-14 ENCOUNTER — Encounter (HOSPITAL_COMMUNITY): Payer: Self-pay

## 2022-04-14 ENCOUNTER — Other Ambulatory Visit: Payer: Self-pay

## 2022-04-14 ENCOUNTER — Ambulatory Visit (HOSPITAL_COMMUNITY): Payer: PPO | Admitting: Anesthesiology

## 2022-04-14 ENCOUNTER — Ambulatory Visit (HOSPITAL_COMMUNITY)
Admission: RE | Admit: 2022-04-14 | Discharge: 2022-04-14 | Disposition: A | Discharge status: Home / Self Care | Payer: PPO | Attending: General Surgery | Admitting: General Surgery

## 2022-04-14 DIAGNOSIS — J45909 Unspecified asthma, uncomplicated: Secondary | ICD-10-CM

## 2022-04-14 DIAGNOSIS — Z803 Family history of malignant neoplasm of breast: Secondary | ICD-10-CM | POA: Diagnosis not present

## 2022-04-14 DIAGNOSIS — I10 Essential (primary) hypertension: Secondary | ICD-10-CM

## 2022-04-14 DIAGNOSIS — N6489 Other specified disorders of breast: Secondary | ICD-10-CM | POA: Insufficient documentation

## 2022-04-14 DIAGNOSIS — N62 Hypertrophy of breast: Secondary | ICD-10-CM | POA: Diagnosis not present

## 2022-04-14 DIAGNOSIS — R519 Headache, unspecified: Secondary | ICD-10-CM | POA: Diagnosis not present

## 2022-04-14 DIAGNOSIS — D0511 Intraductal carcinoma in situ of right breast: Secondary | ICD-10-CM | POA: Diagnosis not present

## 2022-04-14 DIAGNOSIS — F99 Mental disorder, not otherwise specified: Secondary | ICD-10-CM | POA: Diagnosis not present

## 2022-04-14 HISTORY — DX: Personal history of urinary calculi: Z87.442

## 2022-04-14 HISTORY — PX: RE-EXCISION OF BREAST CANCER,SUPERIOR MARGINS: SHX6047

## 2022-04-14 LAB — BASIC METABOLIC PANEL
Anion gap: 8 (ref 5–15)
BUN: 12 mg/dL (ref 8–23)
CO2: 23 mmol/L (ref 22–32)
Calcium: 9.2 mg/dL (ref 8.9–10.3)
Chloride: 106 mmol/L (ref 98–111)
Creatinine, Ser: 0.87 mg/dL (ref 0.44–1.00)
GFR, Estimated: >60 mL/min (ref >60)
Glucose, Bld: 120 mg/dL — ABNORMAL HIGH (ref 70–99)
Potassium: 3.9 mmol/L (ref 3.5–5.1)
Sodium: 137 mmol/L (ref 135–145)

## 2022-04-14 LAB — CBC
HCT: 39.8 % (ref 36.0–46.0)
Hemoglobin: 13.9 g/dL (ref 12.0–15.0)
MCH: 31.2 pg (ref 26.0–34.0)
MCHC: 34.9 g/dL (ref 30.0–36.0)
MCV: 89.2 fL (ref 80.0–100.0)
Platelets: 276 10*3/uL (ref 150–400)
RBC: 4.46 MIL/uL (ref 3.87–5.11)
RDW: 12.1 % (ref 11.5–15.5)
WBC: 6.3 10*3/uL (ref 4.0–10.5)
nRBC: 0 % (ref 0.0–0.2)

## 2022-04-14 SURGERY — RE-EXCISION OF BREAST CANCER,SUPERIOR MARGINS
Anesthesia: General | Site: Breast | Laterality: Right

## 2022-04-14 MED ORDER — DEXAMETHASONE SODIUM PHOSPHATE 10 MG/ML IJ SOLN
INTRAMUSCULAR | Status: AC
  Filled 2022-04-14: qty 1

## 2022-04-14 MED ORDER — FENTANYL CITRATE (PF) 250 MCG/5ML IJ SOLN
INTRAMUSCULAR | Status: DC | PRN
  Administered 2022-04-14: 50 ug via INTRAVENOUS

## 2022-04-14 MED ORDER — ORAL CARE MOUTH RINSE: 15.0000 mL | Freq: Once | OROMUCOSAL | Status: AC

## 2022-04-14 MED ORDER — 0.9 % SODIUM CHLORIDE (POUR BTL) OPTIME
TOPICAL | Status: DC | PRN
  Administered 2022-04-14: 1000 mL

## 2022-04-14 MED ORDER — BUPIVACAINE-EPINEPHRINE (PF) 0.25% -1:200000 IJ SOLN
INTRAMUSCULAR | Status: AC
  Filled 2022-04-14: qty 30

## 2022-04-14 MED ORDER — CHLORHEXIDINE GLUCONATE CLOTH 2 % EX PADS: 6.0000 | MEDICATED_PAD | Freq: Once | CUTANEOUS | Status: DC

## 2022-04-14 MED ORDER — OXYCODONE HCL 5 MG PO TABS: 5.0000 mg | ORAL_TABLET | Freq: Four times a day (QID) | ORAL | 0 refills | Status: DC | PRN

## 2022-04-14 MED ORDER — LACTATED RINGERS IV SOLN: INTRAVENOUS | Status: DC

## 2022-04-14 MED ORDER — PROPOFOL 10 MG/ML IV BOLUS
INTRAVENOUS | Status: AC
  Filled 2022-04-14: qty 20

## 2022-04-14 MED ORDER — CEFAZOLIN SODIUM-DEXTROSE 2-4 GM/100ML-% IV SOLN
2.0000 g | INTRAVENOUS | Status: AC
  Administered 2022-04-14: 2 g via INTRAVENOUS
  Filled 2022-04-14: qty 100

## 2022-04-14 MED ORDER — EPHEDRINE 5 MG/ML INJ
INTRAVENOUS | Status: AC
  Filled 2022-04-14: qty 5

## 2022-04-14 MED ORDER — EPHEDRINE SULFATE-NACL 50-0.9 MG/10ML-% IV SOSY
PREFILLED_SYRINGE | INTRAVENOUS | Status: DC | PRN
  Administered 2022-04-14: 10 mg via INTRAVENOUS
  Administered 2022-04-14 (×2): 5 mg via INTRAVENOUS

## 2022-04-14 MED ORDER — ONDANSETRON HCL 4 MG/2ML IJ SOLN
INTRAMUSCULAR | Status: AC
  Filled 2022-04-14: qty 2

## 2022-04-14 MED ORDER — DEXAMETHASONE SODIUM PHOSPHATE 10 MG/ML IJ SOLN
INTRAMUSCULAR | Status: DC | PRN
  Administered 2022-04-14: 5 mg via INTRAVENOUS

## 2022-04-14 MED ORDER — PROPOFOL 10 MG/ML IV BOLUS
INTRAVENOUS | Status: DC | PRN
  Administered 2022-04-14: 200 mg via INTRAVENOUS

## 2022-04-14 MED ORDER — LIDOCAINE 2% (20 MG/ML) 5 ML SYRINGE
INTRAMUSCULAR | Status: AC
  Filled 2022-04-14: qty 5

## 2022-04-14 MED ORDER — GABAPENTIN 300 MG PO CAPS
300.0000 mg | ORAL_CAPSULE | ORAL | Status: AC
  Administered 2022-04-14: 300 mg via ORAL
  Filled 2022-04-14: qty 1

## 2022-04-14 MED ORDER — CHLORHEXIDINE GLUCONATE 0.12 % MT SOLN
15.0000 mL | Freq: Once | OROMUCOSAL | Status: AC
  Administered 2022-04-14: 15 mL via OROMUCOSAL
  Filled 2022-04-14: qty 15

## 2022-04-14 MED ORDER — ACETAMINOPHEN 500 MG PO TABS
1000.0000 mg | ORAL_TABLET | ORAL | Status: AC
  Administered 2022-04-14: 1000 mg via ORAL
  Filled 2022-04-14: qty 2

## 2022-04-14 MED ORDER — MIDAZOLAM HCL 2 MG/2ML IJ SOLN
INTRAMUSCULAR | Status: AC
  Filled 2022-04-14: qty 2

## 2022-04-14 MED ORDER — BUPIVACAINE-EPINEPHRINE 0.25% -1:200000 IJ SOLN
INTRAMUSCULAR | Status: DC | PRN
  Administered 2022-04-14: 10 mL

## 2022-04-14 MED ORDER — LIDOCAINE 2% (20 MG/ML) 5 ML SYRINGE
INTRAMUSCULAR | Status: DC | PRN
  Administered 2022-04-14: 80 mg via INTRAVENOUS

## 2022-04-14 MED ORDER — FENTANYL CITRATE (PF) 250 MCG/5ML IJ SOLN
INTRAMUSCULAR | Status: AC
  Filled 2022-04-14: qty 5

## 2022-04-14 MED ORDER — ONDANSETRON HCL 4 MG/2ML IJ SOLN
INTRAMUSCULAR | Status: DC | PRN
  Administered 2022-04-14: 4 mg via INTRAVENOUS

## 2022-04-14 MED ORDER — PHENYLEPHRINE 80 MCG/ML (10ML) SYRINGE FOR IV PUSH (FOR BLOOD PRESSURE SUPPORT)
PREFILLED_SYRINGE | INTRAVENOUS | Status: DC | PRN
  Administered 2022-04-14: 160 ug via INTRAVENOUS
  Administered 2022-04-14 (×3): 80 ug via INTRAVENOUS

## 2022-04-14 MED ORDER — PHENYLEPHRINE 80 MCG/ML (10ML) SYRINGE FOR IV PUSH (FOR BLOOD PRESSURE SUPPORT)
PREFILLED_SYRINGE | INTRAVENOUS | Status: AC
  Filled 2022-04-14: qty 10

## 2022-04-14 MED ORDER — HYDROMORPHONE HCL 1 MG/ML IJ SOLN: 0.2500 mg | INTRAMUSCULAR | Status: DC | PRN

## 2022-04-14 MED ORDER — MIDAZOLAM HCL 2 MG/2ML IJ SOLN
INTRAMUSCULAR | Status: DC | PRN
  Administered 2022-04-14: 2 mg via INTRAVENOUS

## 2022-04-14 SURGICAL SUPPLY — 35 items
ADH SKN CLS APL DERMABOND .7 (GAUZE/BANDAGES/DRESSINGS) ×1
APL PRP STRL LF DISP 70% ISPRP (MISCELLANEOUS) ×1
BAG COUNTER SPONGE SURGICOUNT (BAG) ×2 IMPLANT
BAG SPNG CNTER NS LX DISP (BAG) ×1
BINDER BREAST LRG (GAUZE/BANDAGES/DRESSINGS) IMPLANT
BINDER BREAST XLRG (GAUZE/BANDAGES/DRESSINGS) IMPLANT
CANISTER SUCT 3000ML PPV (MISCELLANEOUS) IMPLANT
CHLORAPREP W/TINT 26 (MISCELLANEOUS) ×2 IMPLANT
CNTNR URN SCR LID CUP LEK RST (MISCELLANEOUS) IMPLANT
CONT SPEC 4OZ STRL OR WHT (MISCELLANEOUS)
COVER SURGICAL LIGHT HANDLE (MISCELLANEOUS) ×2 IMPLANT
DERMABOND ADVANCED .7 DNX12 (GAUZE/BANDAGES/DRESSINGS) ×2 IMPLANT
DRAPE CHEST BREAST 15X10 FENES (DRAPES) ×2 IMPLANT
ELECT COATED BLADE 2.86 ST (ELECTRODE) ×2 IMPLANT
ELECT REM PT RETURN 9FT ADLT (ELECTROSURGICAL) ×1
ELECTRODE REM PT RTRN 9FT ADLT (ELECTROSURGICAL) ×2 IMPLANT
GAUZE 4X4 16PLY ~~LOC~~+RFID DBL (SPONGE) ×2 IMPLANT
GLOVE BIO SURGEON STRL SZ7.5 (GLOVE) ×2 IMPLANT
GOWN STRL REUS W/ TWL LRG LVL3 (GOWN DISPOSABLE) ×4 IMPLANT
GOWN STRL REUS W/TWL LRG LVL3 (GOWN DISPOSABLE) ×2
KIT BASIN OR (CUSTOM PROCEDURE TRAY) ×2 IMPLANT
KIT TURNOVER KIT B (KITS) ×2 IMPLANT
LIGHT WAVEGUIDE WIDE FLAT (MISCELLANEOUS) IMPLANT
NDL HYPO 25GX1X1/2 BEV (NEEDLE) ×2 IMPLANT
NEEDLE HYPO 25GX1X1/2 BEV (NEEDLE) ×1 IMPLANT
NS IRRIG 1000ML POUR BTL (IV SOLUTION) ×2 IMPLANT
PACK GENERAL/GYN (CUSTOM PROCEDURE TRAY) ×2 IMPLANT
PAD ARMBOARD 7.5X6 YLW CONV (MISCELLANEOUS) ×2 IMPLANT
PENCIL SMOKE EVACUATOR (MISCELLANEOUS) ×2 IMPLANT
SUT MON AB 4-0 PC3 18 (SUTURE) ×2 IMPLANT
SUT SILK 2 0 PERMA HAND 18 BK (SUTURE) IMPLANT
SUT VIC AB 3-0 SH 18 (SUTURE) ×2 IMPLANT
SYR CONTROL 10ML LL (SYRINGE) ×2 IMPLANT
TOWEL GREEN STERILE (TOWEL DISPOSABLE) ×2 IMPLANT
TOWEL GREEN STERILE FF (TOWEL DISPOSABLE) ×2 IMPLANT

## 2022-04-14 NOTE — Anesthesia Postprocedure Evaluation (Signed)
Anesthesia Post Note  Patient: Taylor Ewing  Procedure(s) Performed: RE-EXCISION OF RIGHT BREAST ANTERIOR MARGIN (Right: Breast)     Patient location during evaluation: PACU Anesthesia Type: General Level of consciousness: awake Pain management: pain level controlled Vital Signs Assessment: post-procedure vital signs reviewed and stable Respiratory status: spontaneous breathing Cardiovascular status: stable Postop Assessment: no apparent nausea or vomiting Anesthetic complications: no   No notable events documented.  Last Vitals:  Vitals:   04/14/22 0945 04/14/22 1000  BP: 139/72 (!) 142/69  Pulse: 89 91  Resp: (!) 23 15  Temp:  36.5 C  SpO2: 93% 94%    Last Pain:  Vitals:   04/14/22 1000  TempSrc:   PainSc: 0-No pain                 Mordche Hedglin

## 2022-04-14 NOTE — Anesthesia Procedure Notes (Signed)
Procedure Name: LMA Insertion Date/Time: 04/14/2022 8:39 AM  Performed by: Reece Agar, CRNAPre-anesthesia Checklist: Patient identified, Emergency Drugs available, Suction available and Patient being monitored Patient Re-evaluated:Patient Re-evaluated prior to induction Oxygen Delivery Method: Circle System Utilized Preoxygenation: Pre-oxygenation with 100% oxygen Induction Type: IV induction Ventilation: Mask ventilation without difficulty LMA: LMA inserted LMA Size: 4.0 Number of attempts: 1 Airway Equipment and Method: Bite block Placement Confirmation: positive ETCO2 Tube secured with: Tape Dental Injury: Teeth and Oropharynx as per pre-operative assessment

## 2022-04-14 NOTE — Op Note (Signed)
04/14/2022  9:08 AM  PATIENT:  Taylor Ewing  71 y.o. female  PRE-OPERATIVE DIAGNOSIS:  RIGHT BREAST DCIS  POST-OPERATIVE DIAGNOSIS:  RIGHT BREAST DCIS  PROCEDURE:  Procedure(s): RE-EXCISION OF RIGHT BREAST ANTERIOR MARGIN (Right)  SURGEON:  Surgeon(s) and Role:    * Jovita Kussmaul, MD - Primary  PHYSICIAN ASSISTANT:   ASSISTANTS: none   ANESTHESIA:   local and general  EBL:  10 mL   BLOOD ADMINISTERED:none  DRAINS: none   LOCAL MEDICATIONS USED:  MARCAINE     SPECIMEN:  Source of Specimen:  right breast anterior margin  DISPOSITION OF SPECIMEN:  PATHOLOGY  COUNTS:  YES  TOURNIQUET:  * No tourniquets in log *  DICTATION: .Dragon Dictation  After informed consent was obtained the patient was brought to the operating room and placed in the supine position on the operating table.  After adequate induction of general anesthesia the patient's right breast was prepped with ChloraPrep, allowed to dry, and draped in usual sterile manner.  An appropriate timeout was performed.  The area around the old operative site was infiltrated with quarter percent Marcaine.  The previous periareolar incision was opened sharply with the 15 blade knife.  The previous lumpectomy cavity was reopened also sharply with a 15 blade knife.  There was a small hematoma cavity that was evacuated.  The anterior surface of the lumpectomy cavity was then excised sharply with the electrocautery.  The specimen was marked with the appropriate paint color.  Hemostasis was achieved using the Bovie electrocautery.  The deep layer of the incision was then closed with interrupted 3-0 Vicryl stitches.  The skin was then closed with interrupted 4-0 Monocryl subcuticular stitches.  Dermabond dressings were applied.  The deepest layer of the lumpectomy cavity was not able to be reapproximated because of the inflammation.  The tissue would not hold the stitch very well.  A compressive top will be used to help manage the  seroma.  PLAN OF CARE: Discharge to home after PACU  PATIENT DISPOSITION:  PACU - hemodynamically stable.   Delay start of Pharmacological VTE agent (>24hrs) due to surgical blood loss or risk of bleeding: not applicable

## 2022-04-14 NOTE — Transfer of Care (Signed)
Immediate Anesthesia Transfer of Care Note  Patient: Taylor Ewing  Procedure(s) Performed: RE-EXCISION OF RIGHT BREAST ANTERIOR MARGIN (Right: Breast)  Patient Location: PACU  Anesthesia Type:General  Level of Consciousness: awake and alert   Airway & Oxygen Therapy: Patient Spontanous Breathing  Post-op Assessment: Report given to RN and Post -op Vital signs reviewed and stable  Post vital signs: Reviewed and stable  Last Vitals:  Vitals Value Taken Time  BP 132/66 04/14/22 0930  Temp    Pulse 97 04/14/22 0930  Resp 21 04/14/22 0931  SpO2 93 % 04/14/22 0930  Vitals shown include unvalidated device data.  Last Pain:  Vitals:   04/14/22 0704  TempSrc:   PainSc: 0-No pain         Complications: No notable events documented.

## 2022-04-14 NOTE — H&P (Signed)
REFERRING PHYSICIAN: Claudina Lick, MD  PROVIDER: Landry Corporal, MD  MRN: [id number] DOB: 1950/09/08 Subjective   Chief Complaint: Breast Cancer   History of Present Illness: Taylor Ewing is a 71 y.o. female who is seen today as an office consultation for evaluation of Breast Cancer .   We are asked to see the patient in consultation by Dr. Robbi Garter to evaluate her for a new right breast cancer. The patient is a 71 year old white female who recently went for a routine screening mammogram. At that time she was found to have a 3 mm area of calcification in the lower inner quadrant of the right breast. This was biopsied and came back as ductal carcinoma in situ that was ER and PR positive. She is otherwise in good health and does not smoke. She does have a fairly strong family history of breast cancer in a maternal great-grandmother, maternal grandmother, mother, and first cousin.  Review of Systems: A complete review of systems was obtained from the patient. I have reviewed this information and discussed as appropriate with the patient. See HPI as well for other ROS.  ROS   Medical History: Past Medical History:  Diagnosis Date  Anxiety  Asthma, unspecified asthma severity, unspecified whether complicated, unspecified whether persistent  History of cancer  Hypertension   Patient Active Problem List  Diagnosis  Ductal carcinoma in situ (DCIS) of right breast   Past Surgical History:  Procedure Laterality Date  LAPAROSCOPIC TUBAL LIGATION    No Known Allergies  Current Outpatient Medications on File Prior to Visit  Medication Sig Dispense Refill  cholecalciferol (VITAMIN D3) 1000 unit tablet Take by mouth  docosahexaenoic acid/epa (FISH OIL ORAL) Take by mouth  famotidine (PEPCID) 20 MG tablet Take 20 mg by mouth at bedtime  losartan (COZAAR) 100 MG tablet Take 100 mg by mouth once daily  multivitamin tablet Take 1 tablet by mouth once daily  sertraline (ZOLOFT) 50  MG tablet Take 50 mg by mouth once daily  simvastatin (ZOCOR) 40 MG tablet Take 40 mg by mouth once daily  zolpidem (AMBIEN) 10 mg tablet Take 10 mg by mouth at bedtime as needed for Sleep   No current facility-administered medications on file prior to visit.   History reviewed. No pertinent family history.   Social History   Tobacco Use  Smoking Status Never  Smokeless Tobacco Never    Social History   Socioeconomic History  Marital status: Divorced  Tobacco Use  Smoking status: Never  Smokeless tobacco: Never   Objective:   Vitals:  BP: (!) 164/84  Pulse: 105  Weight: 65.5 kg (144 lb 6.4 oz)  Height: 162.6 cm ('5\' 4"'$ )   Body mass index is 24.79 kg/m.  Physical Exam Vitals reviewed.  Constitutional:  General: She is not in acute distress. Appearance: Normal appearance.  HENT:  Head: Normocephalic and atraumatic.  Right Ear: External ear normal.  Left Ear: External ear normal.  Nose: Nose normal.  Mouth/Throat:  Mouth: Mucous membranes are moist.  Pharynx: Oropharynx is clear.  Eyes:  General: No scleral icterus. Extraocular Movements: Extraocular movements intact.  Conjunctiva/sclera: Conjunctivae normal.  Pupils: Pupils are equal, round, and reactive to light.  Cardiovascular:  Rate and Rhythm: Normal rate and regular rhythm.  Pulses: Normal pulses.  Heart sounds: Normal heart sounds.  Pulmonary:  Effort: Pulmonary effort is normal. No respiratory distress.  Breath sounds: Normal breath sounds.  Abdominal:  General: Bowel sounds are normal.  Palpations: Abdomen is soft.  Tenderness: There is no abdominal tenderness.  Musculoskeletal:  General: No swelling, tenderness or deformity. Normal range of motion.  Cervical back: Normal range of motion and neck supple.  Skin: General: Skin is warm and dry.  Coloration: Skin is not jaundiced.  Neurological:  General: No focal deficit present.  Mental Status: She is alert and oriented to person, place, and  time.  Psychiatric:  Mood and Affect: Mood normal.  Behavior: Behavior normal.    Breast: There is no palpable mass in either breast. There is no palpable axillary, supraclavicular, or cervical lymphadenopathy.  Labs, Imaging and Diagnostic Testing:  Assessment and Plan:   Diagnoses and all orders for this visit:  Ductal carcinoma in situ (DCIS) of right breast - Ambulatory Referral to Oncology-Medical - Ambulatory Referral to Radiation Oncology - Ambulatory Referral to Genetics - [facility] Case Posting Request; Future    The patient appears to have a 3 mm area of ductal carcinoma in situ in the lower inner quadrant of the right breast. I have discussed with her in detail the different options for treatment and at this point she favors breast conservation which I feel is very reasonable. She will not need a node evaluation. I have discussed with her in detail the risks and benefits of the operation as well as some of the technical aspects including the use of a radioactive seed for localization and she understands and wishes to proceed. I will go ahead and refer her to medical and radiation oncology to discuss adjuvant therapy. I will also refer her to genetics given her family history. We will begin surgical planning. She is also experiencing some lower abdominal pain. We will evaluate her with a CT scan of the abdomen and pelvis.   She underwent lumpectomy. She has a positive anterior margin. Plan for reexcision today

## 2022-04-14 NOTE — Interval H&P Note (Signed)
History and Physical Interval Note:  04/14/2022 8:00 AM  Taylor Ewing  has presented today for surgery, with the diagnosis of RIGHT BREAST DCIS.  The various methods of treatment have been discussed with the patient and family. After consideration of risks, benefits and other options for treatment, the patient has consented to  Procedure(s): RE-EXCISION OF RIGHT BREAST ANTERIOR MARGIN (Right) as a surgical intervention.  The patient's history has been reviewed, patient examined, no change in status, stable for surgery.  I have reviewed the patient's chart and labs.  Questions were answered to the patient's satisfaction.     [name]

## 2022-04-15 ENCOUNTER — Encounter (HOSPITAL_COMMUNITY): Payer: Self-pay | Admitting: General Surgery

## 2022-04-15 LAB — SURGICAL PATHOLOGY

## 2022-04-16 ENCOUNTER — Telehealth: Payer: Self-pay | Admitting: Licensed Clinical Social Worker

## 2022-04-16 NOTE — Telephone Encounter (Signed)
[facility] Clinical Social Work  Clinical Social Work was referred by new patient protocol for assessment of psychosocial needs.  Clinical Social Worker attempted to contact patient by phone  to offer support and assess for needs.   No answer. Left VM with direct contact information.      Alba Perillo E Adama Ivins, LCSW  Clinical Social Worker [facility]        

## 2022-04-17 DIAGNOSIS — Z170 Estrogen receptor positive status [ER+]: Secondary | ICD-10-CM | POA: Diagnosis not present

## 2022-04-17 DIAGNOSIS — D0511 Intraductal carcinoma in situ of right breast: Secondary | ICD-10-CM | POA: Diagnosis not present

## 2022-04-20 ENCOUNTER — Ambulatory Visit
Admission: RE | Admit: 2022-04-20 | Discharge: 2022-04-20 | Disposition: A | Discharge status: Home / Self Care | Payer: PPO | Source: Ambulatory Visit | Attending: General Surgery | Admitting: General Surgery

## 2022-04-20 DIAGNOSIS — Z853 Personal history of malignant neoplasm of breast: Secondary | ICD-10-CM | POA: Diagnosis not present

## 2022-04-20 DIAGNOSIS — N200 Calculus of kidney: Secondary | ICD-10-CM | POA: Diagnosis not present

## 2022-04-20 DIAGNOSIS — R102 Pelvic and perineal pain: Secondary | ICD-10-CM | POA: Diagnosis not present

## 2022-04-20 DIAGNOSIS — D0511 Intraductal carcinoma in situ of right breast: Secondary | ICD-10-CM

## 2022-04-20 DIAGNOSIS — K429 Umbilical hernia without obstruction or gangrene: Secondary | ICD-10-CM | POA: Diagnosis not present

## 2022-04-20 MED ORDER — IOPAMIDOL (ISOVUE-300) INJECTION 61%
100.0000 mL | Freq: Once | INTRAVENOUS | Status: AC | PRN
  Administered 2022-04-20: 100 mL via INTRAVENOUS

## 2022-04-21 ENCOUNTER — Encounter: Payer: Self-pay | Admitting: *Deleted

## 2022-04-23 LAB — SURGICAL PATHOLOGY

## 2022-04-24 NOTE — Progress Notes (Signed)
Location of Breast Cancer:  Ductal carcinoma in situ (DCIS) of right breast   Histology per Pathology Report:  04/14/2022 A. BREAST, RIGHT ANTERIOR MARGIN, RE-EXCISION:  - Benign breast parenchyma with focal usual ductal hyperplasia  - Previous procedure-related changes  - Negative for in situ or invasive carcinom   04/01/2022 A. BREAST, RIGHT, LUMPECTOMY:  - Ductal carcinoma in situ, intermediate grade  - Negative for invasive carcinoma  ADDENDUM:  This addendum is to add that DCIS is present less than 0.1 mm from the superior margin.  The diagnosis is unchanged.   Receptor Status: ER(95%), PR (95%)  Past/Anticipated interventions by surgeon, if any:  04/23/2022 --Dr. Autumn Messing (office visit) The patient is about 3 weeks status post right breast lumpectomy for ductal carcinoma in situ. She tolerated the surgery well. At this point she will follow-up with medical and radiation oncology for adjuvant therapy. I will plan to see her back in about 6 months to check her progress --Return in about 6 months (around 10/23/2022).   04/14/2022 --Dr. Autumn Messing RE-EXCISION OF RIGHT BREAST ANTERIOR MARGIN   04/01/2022 --Dr. Autumn Messing RIGHT BREAST LUMPECTOMY WITH RADIOACTIVE SEED LOCALIZATION   Past/Anticipated interventions by medical oncology, if any:  Under care of Dr. Arletha Pili Iruku 04/10/2022 --Recommendation: Breast conserving surgery, had surgery on September 27, reexcision scheduled for October 10 Followed by adjuvant radiation therapy Followed by antiestrogen therapy with tamoxifen/aromatase inhibitors based on menopausal status 5 years She tells me that she is familiar with tamoxifen, her mom took tamoxifen and tolerated it very well.   She would like to think about these options.   --She will return to clinic after radiation to discuss this further and to initiate antiestrogen therapy.   Lymphedema issues, if any:  No    Pain issues, if any: She reports occasional pain in  the surgical site.    SAFETY ISSUES: Prior radiation? No Pacemaker/ICD? No Possible current pregnancy? No--postmenopausal Is the patient on methotrexate? No  Current Complaints / other details:

## 2022-04-26 NOTE — Progress Notes (Signed)
Radiation Oncology         [phone] ________________________________  Name: Taylor Ewing MRN: [id number]  Date: 04/27/2022  DOB: 07-20-1950  Follow-Up Visit Note  Outpatient  CC: Tower, Wynelle Fanny, MD  [name], MD  Diagnosis:   No diagnosis found.   S/p lumpectomy: Stage 0 (cTis (DCIS), cN0, cM0) Right Breast, Intermediate grade DCIS with necrosis, ER+ / PR+ / Her2 not assessed  CHIEF COMPLAINT: Here to discuss management of right breast DCIS  Narrative:  The patient returns today for follow-up.     Since consultation date of 03/20/22, she opted to proceed with a right breast lumpectomy without nodal biopsies on 04/01/22 under the care of Dr. Marlou Starks.  Pathology from the procedure revealed: tumor size of 3.8 cm; histology of intermediate grade DCIS with necrosis; margin status to in situ disease of less than 0.1 mm from the superior margin, and the anterior margin was questionable (Per Dr. Marlou Starks); no lymph nodes were examined;  ER status: 95% positive; PR status 95% positive, Her2 not assessed.   The patient underwent re-excision of the questionable right anterior margin on 04/14/22 under Dr. Marlou Starks. Pathology showed benign breast parenchyma with focal usual ductal hyperplasia (negative for invasive on in-situ carcinoma).   Pertinent imaging performed in the interval includes a CT of the abdomen and pelvis on 04/20/22 (indicated by pelvic pain for 2 years and history of breast and skin cancer) which showed no acute abnormalities in the abdomen or pelvis. CT also showed left kidney stones (the largest of which measuring 9 mm and without interval change), no evidence of hydronephrosis bilaterally, and a medial lower right breast asymmetry.    The patient has met with Dr. Chryl Heck and will proceed with antiestrogen therapy consisting of tamoxifen x 5 years (based on her menopausal status).   No signs of infection or seroma were appreciated during her most recent post-op visit with Dr.  Marlou Starks on 04/23/22. In regards to the kidney stones seen on her most recent CT , Dr. Marlou Starks encouraged her to follow-up with urology.    Symptomatically, the patient reports: ***        ALLERGIES:  has No Known Allergies.  Meds: Current Outpatient Medications  Medication Sig Dispense Refill   acetaminophen (TYLENOL) 500 MG tablet Take 500 mg by mouth every 6 (six) hours as needed for moderate pain.     albuterol (PROVENTIL HFA;VENTOLIN HFA) 108 (90 Base) MCG/ACT inhaler INHALE TWO PUFFS BY MOUTH EVERY 4 HOURS AS NEEDED FOR  WHEEZE 9 g 1   cetirizine (ZYRTEC) 10 MG tablet Take 1 tablet by mouth daily as needed (allergies).     Cholecalciferol (VITAMIN D) 50 MCG (2000 UT) CAPS Take 2,000 Units by mouth daily.     famotidine (PEPCID) 20 MG tablet TAKE 1 TABLET BY MOUTH AT BEDTIME 90 tablet 1   losartan (COZAAR) 100 MG tablet Take 1 tablet (100 mg total) by mouth daily. 90 tablet 3   Multiple Vitamin (MULTIVITAMIN) tablet Take 1 tablet by mouth daily.     Omega-3 Fatty Acids (FISH OIL PO) Take 1,400 mg by mouth daily.     oxyCODONE (ROXICODONE) 5 MG immediate release tablet Take 1 tablet (5 mg total) by mouth every 6 (six) hours as needed for severe pain. 10 tablet 0   sertraline (ZOLOFT) 50 MG tablet Take 1 tablet (50 mg total) by mouth daily. 90 tablet 3   simvastatin (ZOCOR) 40 MG tablet Take 1 tablet (40 mg total)  by mouth daily. 90 tablet 3   zolpidem (AMBIEN) 10 MG tablet TAKE 1 TABLET BY MOUTH AT BEDTIME AS NEEDED FOR SLEEP 30 tablet 2   No current facility-administered medications for this encounter.    Physical Findings:  vitals were not taken for this visit. .     General: Alert and oriented, in no acute distress HEENT: Head is normocephalic. Extraocular movements are intact. Oropharynx is clear. Neck: Neck is supple, no palpable cervical or supraclavicular lymphadenopathy. Heart: Regular in rate and rhythm with no murmurs, rubs, or gallops. Chest: Clear to auscultation bilaterally,  with no rhonchi, wheezes, or rales. Abdomen: Soft, nontender, nondistended, with no rigidity or guarding. Extremities: No cyanosis or edema. Lymphatics: see Neck Exam Musculoskeletal: symmetric strength and muscle tone throughout. Neurologic: No obvious focalities. Speech is fluent.  Psychiatric: Judgment and insight are intact. Affect is appropriate. Breast exam reveals ***  Lab Findings: Lab Results  Component Value Date   WBC 6.3 04/14/2022   HGB 13.9 04/14/2022   HCT 39.8 04/14/2022   MCV 89.2 04/14/2022   PLT 276 04/14/2022    _0 @  Radiographic Findings: CT ABDOMEN PELVIS W CONTRAST  Result Date: 04/21/2022 CLINICAL DATA:  Pelvic pain for 2 years. History of ductal carcinoma in-situ of right breast and skin cancer. EXAM: CT ABDOMEN AND PELVIS WITH CONTRAST TECHNIQUE: Multidetector CT imaging of the abdomen and pelvis was performed using the standard protocol following bolus administration of intravenous contrast. RADIATION DOSE REDUCTION: This exam was performed according to the departmental dose-optimization program which includes automated exposure control, adjustment of the mA and/or kV according to patient size and/or use of iterative reconstruction technique. CONTRAST:  173m ISOVUE-300 IOPAMIDOL (ISOVUE-300) INJECTION 61% COMPARISON:  Nov 06, 2019 FINDINGS: Lower chest: Medial lower right breast asymmetry is identified. No prior mammogram is available on PACs for comparison. The lung bases are otherwise clear. Hepatobiliary: No focal liver abnormality is seen. No gallstones, gallbladder wall thickening, or biliary dilatation. Pancreas: Unremarkable. No pancreatic ductal dilatation or surrounding inflammatory changes. Spleen: Normal in size without focal abnormality. Adrenals/Urinary Tract: The bilateral adrenal glands are normal. Left kidney stones are identified, largest measures 9 mm unchanged. There is no hydronephrosis bilaterally. No focal renal lesion is  identified. Bladder is normal. Stomach/Bowel: Stomach is within normal limits. Appendix appears normal. No evidence of bowel wall thickening, distention, or inflammatory changes. Vascular/Lymphatic: Aortic atherosclerosis. No enlarged abdominal or pelvic lymph nodes. Reproductive: Uterus and bilateral adnexa are unremarkable. Other: Midline umbilical herniation of mesenteric fat is identified. Musculoskeletal: Degenerative joint changes of the lumbar spine identified. 2 mm focal sclerosis of the sacrum is unchanged compared to prior exam. IMPRESSION: 1. No acute abnormality identified in the abdomen and pelvis. 2. Left kidney stones, largest measures 9 mm unchanged. No hydronephrosis bilaterally. 3. Medial lower right breast asymmetry is identified. No prior mammogram is available on PACs for comparison. Recommend correlation with mammography. 4. Aortic atherosclerosis. Aortic Atherosclerosis (ICD10-I70.0). Electronically Signed   By: WAbelardo DieselM.D.   On: 04/21/2022 14:30    Impression/Plan: We discussed adjuvant radiotherapy today.  I recommend *** in order to ***.  I reviewed the logistics, benefits, risks, and potential side effects of this treatment in detail. Risks may include but not necessary be limited to acute and late injury tissue in the radiation fields such as skin irritation (change in color/pigmentation, itching, dryness, pain, peeling). She may experience fatigue. We also discussed possible risk of long term cosmetic changes or scar tissue. There is also  a smaller risk for lung toxicity, ***cardiac toxicity, ***brachial plexopathy, ***lymphedema, ***musculoskeletal changes, ***rib fragility or ***induction of a second malignancy, ***late chronic non-healing soft tissue wound.    The patient asked good questions which I answered to her satisfaction. She is enthusiastic about proceeding with treatment. A consent form has been *** signed and placed in her chart.  A total of *** medically  necessary complex treatment devices will be fabricated and supervised by me: *** fields with MLCs for custom blocks to protect heart, and lungs;  and, a Vac-lok. MORE COMPLEX DEVICES MAY BE MADE IN DOSIMETRY FOR FIELD IN FIELD BEAMS FOR DOSE HOMOGENEITY.  I have requested : 3D Simulation which is medically necessary to give adequate dose to at risk tissues while sparing lungs and heart.  I have requested a DVH of the following structures: lungs, heart, *** lumpectomy cavity.    The patient will receive *** Gy in *** fractions to the *** with *** fields.  This will be *** followed by a boost.  On date of service, in total, I spent *** minutes on this encounter. Patient was seen in person.  _____________________________________   Eppie Gibson, MD  This document serves as a record of services personally performed by Eppie Gibson, MD. It was created on her behalf by Roney Mans, a trained medical scribe. The creation of this record is based on the scribe's personal observations and the provider's statements to them. This document has been checked and approved by the attending provider.

## 2022-04-27 ENCOUNTER — Encounter: Payer: Self-pay | Admitting: Radiation Oncology

## 2022-04-27 ENCOUNTER — Other Ambulatory Visit: Payer: Self-pay

## 2022-04-27 ENCOUNTER — Ambulatory Visit
Admission: RE | Admit: 2022-04-27 | Discharge: 2022-04-27 | Disposition: A | Discharge status: Home / Self Care | Payer: PPO | Source: Ambulatory Visit | Attending: Radiation Oncology | Admitting: Radiation Oncology

## 2022-04-27 VITALS — BP 135/65 | HR 71 | Temp 97.9°F | Resp 18 | Ht 64.0 in | Wt 143.0 lb

## 2022-04-27 DIAGNOSIS — N200 Calculus of kidney: Secondary | ICD-10-CM | POA: Insufficient documentation

## 2022-04-27 DIAGNOSIS — Z85828 Personal history of other malignant neoplasm of skin: Secondary | ICD-10-CM | POA: Diagnosis not present

## 2022-04-27 DIAGNOSIS — I700 Atherosclerosis of aorta: Secondary | ICD-10-CM | POA: Diagnosis not present

## 2022-04-27 DIAGNOSIS — Z79899 Other long term (current) drug therapy: Secondary | ICD-10-CM | POA: Insufficient documentation

## 2022-04-27 DIAGNOSIS — D0511 Intraductal carcinoma in situ of right breast: Secondary | ICD-10-CM | POA: Insufficient documentation

## 2022-04-27 DIAGNOSIS — Z170 Estrogen receptor positive status [ER+]: Secondary | ICD-10-CM | POA: Insufficient documentation

## 2022-04-27 DIAGNOSIS — Z79810 Long term (current) use of selective estrogen receptor modulators (SERMs): Secondary | ICD-10-CM | POA: Diagnosis not present

## 2022-04-30 ENCOUNTER — Other Ambulatory Visit: Payer: Self-pay | Admitting: Family Medicine

## 2022-05-01 DIAGNOSIS — Z170 Estrogen receptor positive status [ER+]: Secondary | ICD-10-CM | POA: Diagnosis not present

## 2022-05-01 DIAGNOSIS — D0511 Intraductal carcinoma in situ of right breast: Secondary | ICD-10-CM | POA: Diagnosis not present

## 2022-05-04 ENCOUNTER — Encounter: Payer: Self-pay | Admitting: *Deleted

## 2022-05-11 ENCOUNTER — Ambulatory Visit: Payer: PPO

## 2022-05-11 ENCOUNTER — Ambulatory Visit: Payer: PPO | Admitting: Radiation Oncology

## 2022-05-12 ENCOUNTER — Other Ambulatory Visit: Payer: Self-pay

## 2022-05-12 ENCOUNTER — Ambulatory Visit
Admission: RE | Admit: 2022-05-12 | Discharge: 2022-05-12 | Disposition: A | Discharge status: Home / Self Care | Payer: PPO | Source: Ambulatory Visit | Attending: Radiation Oncology | Admitting: Radiation Oncology

## 2022-05-12 DIAGNOSIS — D0511 Intraductal carcinoma in situ of right breast: Secondary | ICD-10-CM | POA: Diagnosis not present

## 2022-05-12 DIAGNOSIS — Z510 Encounter for antineoplastic radiation therapy: Secondary | ICD-10-CM | POA: Diagnosis not present

## 2022-05-12 DIAGNOSIS — Z170 Estrogen receptor positive status [ER+]: Secondary | ICD-10-CM | POA: Diagnosis not present

## 2022-05-12 LAB — RAD ONC ARIA SESSION SUMMARY
Course Elapsed Days: 0
Plan Fractions Treated to Date: 1
Plan Prescribed Dose Per Fraction: 2.67 Gy
Plan Total Fractions Prescribed: 15
Plan Total Prescribed Dose: 40.05 Gy
Reference Point Dosage Given to Date: 2.67 Gy
Reference Point Session Dosage Given: 2.67 Gy
Session Number: 1

## 2022-05-13 ENCOUNTER — Other Ambulatory Visit: Payer: Self-pay

## 2022-05-13 ENCOUNTER — Ambulatory Visit
Admission: RE | Admit: 2022-05-13 | Discharge: 2022-05-13 | Disposition: A | Discharge status: Home / Self Care | Payer: PPO | Source: Ambulatory Visit | Attending: Radiation Oncology | Admitting: Radiation Oncology

## 2022-05-13 DIAGNOSIS — D0511 Intraductal carcinoma in situ of right breast: Secondary | ICD-10-CM | POA: Diagnosis not present

## 2022-05-13 DIAGNOSIS — Z170 Estrogen receptor positive status [ER+]: Secondary | ICD-10-CM | POA: Diagnosis not present

## 2022-05-13 DIAGNOSIS — Z510 Encounter for antineoplastic radiation therapy: Secondary | ICD-10-CM | POA: Diagnosis not present

## 2022-05-13 LAB — RAD ONC ARIA SESSION SUMMARY
Course Elapsed Days: 1
Plan Fractions Treated to Date: 2
Plan Prescribed Dose Per Fraction: 2.67 Gy
Plan Total Fractions Prescribed: 15
Plan Total Prescribed Dose: 40.05 Gy
Reference Point Dosage Given to Date: 5.34 Gy
Reference Point Session Dosage Given: 2.67 Gy
Session Number: 2

## 2022-05-14 ENCOUNTER — Ambulatory Visit
Admission: RE | Admit: 2022-05-14 | Discharge: 2022-05-14 | Disposition: A | Discharge status: Home / Self Care | Payer: PPO | Source: Ambulatory Visit | Attending: Radiation Oncology | Admitting: Radiation Oncology

## 2022-05-14 ENCOUNTER — Other Ambulatory Visit: Payer: Self-pay

## 2022-05-14 DIAGNOSIS — Z510 Encounter for antineoplastic radiation therapy: Secondary | ICD-10-CM | POA: Diagnosis not present

## 2022-05-14 DIAGNOSIS — Z170 Estrogen receptor positive status [ER+]: Secondary | ICD-10-CM | POA: Diagnosis not present

## 2022-05-14 DIAGNOSIS — D0511 Intraductal carcinoma in situ of right breast: Secondary | ICD-10-CM | POA: Diagnosis not present

## 2022-05-14 LAB — RAD ONC ARIA SESSION SUMMARY
Course Elapsed Days: 2
Plan Fractions Treated to Date: 3
Plan Prescribed Dose Per Fraction: 2.67 Gy
Plan Total Fractions Prescribed: 15
Plan Total Prescribed Dose: 40.05 Gy
Reference Point Dosage Given to Date: 8.01 Gy
Reference Point Session Dosage Given: 2.67 Gy
Session Number: 3

## 2022-05-15 ENCOUNTER — Ambulatory Visit
Admission: RE | Admit: 2022-05-15 | Discharge: 2022-05-15 | Disposition: A | Discharge status: Home / Self Care | Payer: PPO | Source: Ambulatory Visit | Attending: Radiation Oncology | Admitting: Radiation Oncology

## 2022-05-15 ENCOUNTER — Other Ambulatory Visit: Payer: Self-pay

## 2022-05-15 DIAGNOSIS — Z510 Encounter for antineoplastic radiation therapy: Secondary | ICD-10-CM | POA: Diagnosis not present

## 2022-05-15 DIAGNOSIS — Z170 Estrogen receptor positive status [ER+]: Secondary | ICD-10-CM | POA: Diagnosis not present

## 2022-05-15 DIAGNOSIS — D0511 Intraductal carcinoma in situ of right breast: Secondary | ICD-10-CM | POA: Diagnosis not present

## 2022-05-15 LAB — RAD ONC ARIA SESSION SUMMARY
Course Elapsed Days: 3
Plan Fractions Treated to Date: 4
Plan Prescribed Dose Per Fraction: 2.67 Gy
Plan Total Fractions Prescribed: 15
Plan Total Prescribed Dose: 40.05 Gy
Reference Point Dosage Given to Date: 10.68 Gy
Reference Point Session Dosage Given: 2.67 Gy
Session Number: 4

## 2022-05-18 ENCOUNTER — Other Ambulatory Visit: Payer: Self-pay

## 2022-05-18 ENCOUNTER — Ambulatory Visit
Admission: RE | Admit: 2022-05-18 | Discharge: 2022-05-18 | Disposition: A | Discharge status: Home / Self Care | Payer: PPO | Source: Ambulatory Visit | Attending: Radiation Oncology | Admitting: Radiation Oncology

## 2022-05-18 DIAGNOSIS — Z510 Encounter for antineoplastic radiation therapy: Secondary | ICD-10-CM | POA: Diagnosis not present

## 2022-05-18 DIAGNOSIS — Z170 Estrogen receptor positive status [ER+]: Secondary | ICD-10-CM | POA: Diagnosis not present

## 2022-05-18 DIAGNOSIS — D0511 Intraductal carcinoma in situ of right breast: Secondary | ICD-10-CM

## 2022-05-18 LAB — RAD ONC ARIA SESSION SUMMARY
Course Elapsed Days: 6
Plan Fractions Treated to Date: 5
Plan Prescribed Dose Per Fraction: 2.67 Gy
Plan Total Fractions Prescribed: 15
Plan Total Prescribed Dose: 40.05 Gy
Reference Point Dosage Given to Date: 13.35 Gy
Reference Point Session Dosage Given: 2.67 Gy
Session Number: 5

## 2022-05-18 MED ORDER — ALRA NON-METALLIC DEODORANT (RAD-ONC)
1.0000 | Freq: Once | TOPICAL | Status: AC
  Administered 2022-05-18: 1 via TOPICAL

## 2022-05-18 MED ORDER — RADIAPLEXRX EX GEL: Freq: Once | CUTANEOUS | Status: AC

## 2022-05-19 ENCOUNTER — Ambulatory Visit
Admission: RE | Admit: 2022-05-19 | Discharge: 2022-05-19 | Disposition: A | Discharge status: Home / Self Care | Payer: PPO | Source: Ambulatory Visit | Attending: Radiation Oncology | Admitting: Radiation Oncology

## 2022-05-19 ENCOUNTER — Encounter: Payer: Self-pay | Admitting: Genetic Counselor

## 2022-05-19 ENCOUNTER — Inpatient Hospital Stay: Payer: PPO

## 2022-05-19 ENCOUNTER — Inpatient Hospital Stay: Payer: PPO | Attending: Hematology and Oncology | Admitting: Genetic Counselor

## 2022-05-19 ENCOUNTER — Other Ambulatory Visit: Payer: Self-pay | Admitting: Genetic Counselor

## 2022-05-19 ENCOUNTER — Other Ambulatory Visit: Payer: Self-pay

## 2022-05-19 DIAGNOSIS — Z510 Encounter for antineoplastic radiation therapy: Secondary | ICD-10-CM | POA: Diagnosis not present

## 2022-05-19 DIAGNOSIS — Z1379 Encounter for other screening for genetic and chromosomal anomalies: Secondary | ICD-10-CM

## 2022-05-19 DIAGNOSIS — D0511 Intraductal carcinoma in situ of right breast: Secondary | ICD-10-CM | POA: Diagnosis not present

## 2022-05-19 DIAGNOSIS — Z803 Family history of malignant neoplasm of breast: Secondary | ICD-10-CM

## 2022-05-19 DIAGNOSIS — Z853 Personal history of malignant neoplasm of breast: Secondary | ICD-10-CM | POA: Diagnosis not present

## 2022-05-19 DIAGNOSIS — Z170 Estrogen receptor positive status [ER+]: Secondary | ICD-10-CM | POA: Diagnosis not present

## 2022-05-19 LAB — RAD ONC ARIA SESSION SUMMARY
Course Elapsed Days: 7
Plan Fractions Treated to Date: 6
Plan Prescribed Dose Per Fraction: 2.67 Gy
Plan Total Fractions Prescribed: 15
Plan Total Prescribed Dose: 40.05 Gy
Reference Point Dosage Given to Date: 16.02 Gy
Reference Point Session Dosage Given: 2.67 Gy
Session Number: 6

## 2022-05-19 LAB — GENETIC SCREENING ORDER

## 2022-05-19 NOTE — Progress Notes (Unsigned)
REFERRING PROVIDER: Jovita Kussmaul, MD [address] [place],  [place] [id number]  PRIMARY PROVIDER:  Abner Greenspan, MD  PRIMARY REASON FOR VISIT:  1. Ductal carcinoma in situ (DCIS) of right breast   2. Family history of breast cancer     HISTORY OF PRESENT ILLNESS:   Ms. Marlar, a 71 y.o. female, was seen for a  cancer genetics consultation at the request of Dr. Marlou Starks due to a personal and family history of cancer.  Ms. Cowell presents to clinic today to discuss the possibility of a hereditary predisposition to cancer, to discuss genetic testing, and to further clarify her future cancer risks, as well as potential cancer risks for family members.   In September 2023, at the age of 59, Ms. Parish was diagnosed with right breast ductal carcinoma in situ (ER/PR positive).  CANCER HISTORY:  Oncology History  Ductal carcinoma in situ (DCIS) of right breast  02/23/2022 Mammogram   3D screening mammogram showed indeterminate right breast calcifications.   03/12/2022 Pathology Results   Pathology from the right breast lumpectomy showed intermediate grade DCIS, negative for invasive carcinoma.  Prognostic showed ER 95% positive strong staining PR 95% positive strong staining   03/20/2022 Initial Diagnosis   Ductal carcinoma in situ (DCIS) of right breast   04/01/2022 Definitive Surgery   She had right breast lumpectomy on September 27 which once again showed a 38 mm measuring DCIS, intermediate grade, foci of atypical ductal epithelium seen at the anterior margin and close proximity to DCIS.  Questionable positive margin.     RISK FACTORS:  First live birth at age 56.  OCP use for approximately <10 years.  Ovaries intact: yes.  Uterus intact: yes.  Menopausal status: postmenopausal.  HRT use: 0 years. Colonoscopy: yes;  reports a couple polyps noted on most recent colonoscopy but told to return in 10 years . Mammogram within the last year: yes. Any excessive  radiation exposure in the past: no  Past Medical History:  Diagnosis Date   Allergy    Anxiety    Asthma    Breast cancer ([place])    right breast   Cancer ([place])    skin, basal cell   Essential hypertension    History of kidney stones    Insomnia    Migraines    very rarely now, more prior to menopause   TMJ (dislocation of temporomandibular joint)     Past Surgical History:  Procedure Laterality Date   BREAST BIOPSY  2023   BREAST LUMPECTOMY WITH RADIOACTIVE SEED LOCALIZATION Right 04/01/2022   Procedure: RIGHT BREAST LUMPECTOMY WITH RADIOACTIVE SEED LOCALIZATION;  Surgeon: Jovita Kussmaul, MD;  Location: [facility];  Service: General;  Laterality: Right;   HAND SURGERY Left 2021   schwannoma removal   HEMORRHOID SURGERY  11/04/2003   RE-EXCISION OF BREAST CANCER,SUPERIOR MARGINS Right 04/14/2022   Procedure: RE-EXCISION OF RIGHT BREAST ANTERIOR MARGIN;  Surgeon: Jovita Kussmaul, MD;  Location: [facility];  Service: General;  Laterality: Right;   SKIN LESION EXCISION     TUBAL LIGATION      Social History   Socioeconomic History   Marital status: Divorced    Spouse name: Not on file   Number of children: 2   Years of education: Not on file   Highest education level: Not on file  Occupational History   Not on file  Tobacco Use   Smoking status: Never   Smokeless tobacco: Never  Vaping Use   Vaping Use: Never used  Substance and Sexual Activity   Alcohol use: Yes    Alcohol/week: 0.0 standard drinks of alcohol    Comment: 1-2 weekly   Drug use: No   Sexual activity: Yes  Other Topics Concern   Not on file  Social History Narrative   Not on file   Social Determinants of Health   Financial Resource Strain: Low Risk  (11/10/2019)   Overall Financial Resource Strain (CARDIA)    Difficulty of Paying Living Expenses: Not hard at all  Food Insecurity: No Food Insecurity (11/10/2019)   Hunger Vital Sign    Worried About Running Out of Food in the Last Year:  Never true    [place] in the Last Year: Never true  Transportation Needs: No Transportation Needs (11/10/2019)   PRAPARE - Hydrologist (Medical): No    Lack of Transportation (Non-Medical): No  Physical Activity: Sufficiently Active (11/10/2019)   Exercise Vital Sign    Days of Exercise per Week: 5 days    Minutes of Exercise per Session: 60 min  Stress: No Stress Concern Present (11/10/2019)   [place]    Feeling of Stress : Only a little  Social Connections: Not on file     FAMILY HISTORY:  We obtained a detailed, 4-generation family history.  Significant diagnoses are listed below: Family History  Problem Relation Age of Onset   Depression Mother    Breast cancer Mother    Depression Father    Hyperlipidemia Sister    Cancer Sister        thyroid   Hyperlipidemia Brother    Cancer Maternal Grandmother        breast   Heart attack Maternal Grandmother    Cancer Maternal Grandfather        stomach   Nephrolithiasis Daughter    Colon polyps Daughter    Cancer Other        1st cousin, breast cancer       Ms. Laverne's sister was diagnosed with thyroid cancer at age 71. He mother was diagnosed with breast cancer at age 20 and reportedly had negative genetic testing. Her maternal aunt was diagnosed with lung cancer in her 24s, she died at age 62. This aunt's daughter (Ms. Udovich's first cousin) was diagnosed with breast cancer at age 75. Ms. Fan's maternal grandmother was diagnosed with breast cancer in her 73s, she died at age 88. Her maternal great grandmother (grandmother's mother) was diagnosed with breast cancer in her 90s/70s, she is deceased.   Ms. Troutman's paternal aunt was diagnosed with breast cancer at an unknown age, she is deceased. A second paternal aunt was diagnosed with a cancerous tumor on her leg at an unknown age, she is deceased. Her paternal  grandfather was diagnosed with stomach cancer at age 52, he died in his late 75s. There is no reported Ashkenazi Jewish ancestry.  GENETIC COUNSELING ASSESSMENT: Ms. Rockhold is a 71 y.o. female with a personal and family history of cancer which is somewhat suggestive of a hereditary predisposition to cancer given multiple generations affected with breast cancer. We, therefore, discussed and recommended the following at today's visit.   DISCUSSION: We discussed that 5 - 10% of cancer is hereditary, with most cases of breast cancer associated with BRCA1/2.  There are other genes that can be associated with hereditary breast cancer syndromes.  We discussed  that testing is beneficial for several reasons including knowing how to follow individuals after completing their treatment, identifying whether potential treatment options would be beneficial, and understanding if other family members could be at risk for cancer and allowing them to undergo genetic testing.   We reviewed the characteristics, features and inheritance patterns of hereditary cancer syndromes. We also discussed genetic testing, including the appropriate family members to test, the process of testing, insurance coverage and turn-around-time for results. We discussed the implications of a negative, positive, carrier and/or variant of uncertain significant result. We recommended Ms. Elbert pursue genetic testing for a panel that includes genes associated with breast, thyroid, and stomach cancer.   Ms. Smitherman  was offered a common hereditary cancer panel (47 genes) and an expanded pan-cancer panel (77 genes). Ms. Lengacher was informed of the benefits and limitations of each panel, including that expanded pan-cancer panels contain genes that do not have clear management guidelines at this point in time.  We also discussed that as the number of genes included on a panel increases, the chances of variants of uncertain significance increases. After  considering the benefits and limitations of each gene panel, Ms. Shiley elected to have [facility] CancerNext-Expanded Panel.  The CancerNext-Expanded gene panel offered by [facility] and includes sequencing, rearrangement, and RNA analysis for the following 77 genes: AIP, ALK, APC, ATM, AXIN2, BAP1, BARD1, BLM, BMPR1A, BRCA1, BRCA2, BRIP1, CDC73, CDH1, CDK4, CDKN1B, CDKN2A, CHEK2, CTNNA1, DICER1, FANCC, FH, FLCN, GALNT12, KIF1B, LZTR1, MAX, MEN1, MET, MLH1, MSH2, MSH3, MSH6, MUTYH, NBN, NF1, NF2, NTHL1, PALB2, PHOX2B, PMS2, POT1, PRKAR1A, PTCH1, PTEN, RAD51C, RAD51D, RB1, RECQL, RET, SDHA, SDHAF2, SDHB, SDHC, SDHD, SMAD4, SMARCA4, SMARCB1, SMARCE1, STK11, SUFU, TMEM127, TP53, TSC1, TSC2, VHL and XRCC2 (sequencing and deletion/duplication); EGFR, EGLN1, HOXB13, KIT, MITF, PDGFRA, POLD1, and POLE (sequencing only); EPCAM and GREM1 (deletion/duplication only).   Based on Ms. Goodner's personal and family history of cancer, she meets medical criteria for genetic testing. Despite that she meets criteria, she may still have an out of pocket cost. We discussed that if her out of pocket cost for testing is over $100, the laboratory will call and confirm whether she wants to proceed with testing.  If the out of pocket cost of testing is less than $100 she will be billed by the genetic testing laboratory.   PLAN: After considering the risks, benefits, and limitations, Ms. Feijoo provided informed consent to pursue genetic testing and the blood sample was sent to [facility] for analysis of the CancerNext-Expanded Panel. Results should be available within approximately 2-3 weeks' time, at which point they will be disclosed by telephone to Ms. Plasse, as will any additional recommendations warranted by these results. Ms. Rabanal will receive a summary of her genetic counseling visit and a copy of her results once available. This information will also be available in Epic.   Ms. Appleman's questions were  answered to her satisfaction today. Our contact information was provided should additional questions or concerns arise. Thank you for the referral and allowing [place] to share in the care of your patient.   [name], MS, [facility] Genetic Counselor [email or web address] (P) [phone]  The patient was seen for a total of 40 minutes in face-to-face genetic counseling.The patient was seen alone.  Drs. Lindi Adie and/or Burr Medico were available to discuss this case as needed.   _______________________________________________________________________ For Office Staff:  Number of people involved in session: 1 Was an Intern/ student involved with case: no

## 2022-05-20 ENCOUNTER — Other Ambulatory Visit: Payer: Self-pay

## 2022-05-20 ENCOUNTER — Ambulatory Visit
Admission: RE | Admit: 2022-05-20 | Discharge: 2022-05-20 | Disposition: A | Discharge status: Home / Self Care | Payer: PPO | Source: Ambulatory Visit | Attending: Radiation Oncology | Admitting: Radiation Oncology

## 2022-05-20 DIAGNOSIS — Z510 Encounter for antineoplastic radiation therapy: Secondary | ICD-10-CM | POA: Diagnosis not present

## 2022-05-20 DIAGNOSIS — Z170 Estrogen receptor positive status [ER+]: Secondary | ICD-10-CM | POA: Diagnosis not present

## 2022-05-20 DIAGNOSIS — D0511 Intraductal carcinoma in situ of right breast: Secondary | ICD-10-CM | POA: Diagnosis not present

## 2022-05-20 LAB — RAD ONC ARIA SESSION SUMMARY
Course Elapsed Days: 8
Plan Fractions Treated to Date: 7
Plan Prescribed Dose Per Fraction: 2.67 Gy
Plan Total Fractions Prescribed: 15
Plan Total Prescribed Dose: 40.05 Gy
Reference Point Dosage Given to Date: 18.69 Gy
Reference Point Session Dosage Given: 2.67 Gy
Session Number: 7

## 2022-05-21 ENCOUNTER — Other Ambulatory Visit: Payer: Self-pay

## 2022-05-21 ENCOUNTER — Ambulatory Visit
Admission: RE | Admit: 2022-05-21 | Discharge: 2022-05-21 | Disposition: A | Discharge status: Home / Self Care | Payer: PPO | Source: Ambulatory Visit | Attending: Radiation Oncology | Admitting: Radiation Oncology

## 2022-05-21 DIAGNOSIS — D0511 Intraductal carcinoma in situ of right breast: Secondary | ICD-10-CM | POA: Diagnosis not present

## 2022-05-21 DIAGNOSIS — Z510 Encounter for antineoplastic radiation therapy: Secondary | ICD-10-CM | POA: Diagnosis not present

## 2022-05-21 DIAGNOSIS — Z170 Estrogen receptor positive status [ER+]: Secondary | ICD-10-CM | POA: Diagnosis not present

## 2022-05-21 LAB — RAD ONC ARIA SESSION SUMMARY
Course Elapsed Days: 9
Plan Fractions Treated to Date: 8
Plan Prescribed Dose Per Fraction: 2.67 Gy
Plan Total Fractions Prescribed: 15
Plan Total Prescribed Dose: 40.05 Gy
Reference Point Dosage Given to Date: 21.36 Gy
Reference Point Session Dosage Given: 2.67 Gy
Session Number: 8

## 2022-05-22 ENCOUNTER — Ambulatory Visit
Admission: RE | Admit: 2022-05-22 | Discharge: 2022-05-22 | Disposition: A | Discharge status: Home / Self Care | Payer: PPO | Source: Ambulatory Visit | Attending: Radiation Oncology | Admitting: Radiation Oncology

## 2022-05-22 ENCOUNTER — Other Ambulatory Visit: Payer: Self-pay

## 2022-05-22 DIAGNOSIS — D0511 Intraductal carcinoma in situ of right breast: Secondary | ICD-10-CM | POA: Diagnosis not present

## 2022-05-22 DIAGNOSIS — Z510 Encounter for antineoplastic radiation therapy: Secondary | ICD-10-CM | POA: Diagnosis not present

## 2022-05-22 DIAGNOSIS — Z170 Estrogen receptor positive status [ER+]: Secondary | ICD-10-CM | POA: Diagnosis not present

## 2022-05-22 LAB — RAD ONC ARIA SESSION SUMMARY
Course Elapsed Days: 10
Plan Fractions Treated to Date: 9
Plan Prescribed Dose Per Fraction: 2.67 Gy
Plan Total Fractions Prescribed: 15
Plan Total Prescribed Dose: 40.05 Gy
Reference Point Dosage Given to Date: 24.03 Gy
Reference Point Session Dosage Given: 2.67 Gy
Session Number: 9

## 2022-05-24 ENCOUNTER — Ambulatory Visit
Admission: RE | Admit: 2022-05-24 | Discharge: 2022-05-24 | Disposition: A | Discharge status: Home / Self Care | Payer: PPO | Source: Ambulatory Visit | Attending: Radiation Oncology | Admitting: Radiation Oncology

## 2022-05-24 ENCOUNTER — Other Ambulatory Visit: Payer: Self-pay

## 2022-05-24 DIAGNOSIS — D0511 Intraductal carcinoma in situ of right breast: Secondary | ICD-10-CM | POA: Diagnosis not present

## 2022-05-24 DIAGNOSIS — Z510 Encounter for antineoplastic radiation therapy: Secondary | ICD-10-CM | POA: Diagnosis not present

## 2022-05-24 DIAGNOSIS — Z170 Estrogen receptor positive status [ER+]: Secondary | ICD-10-CM | POA: Diagnosis not present

## 2022-05-24 LAB — RAD ONC ARIA SESSION SUMMARY
Course Elapsed Days: 12
Plan Fractions Treated to Date: 10
Plan Prescribed Dose Per Fraction: 2.67 Gy
Plan Total Fractions Prescribed: 15
Plan Total Prescribed Dose: 40.05 Gy
Reference Point Dosage Given to Date: 26.7 Gy
Reference Point Session Dosage Given: 2.67 Gy
Session Number: 10

## 2022-05-25 ENCOUNTER — Ambulatory Visit
Admission: RE | Admit: 2022-05-25 | Discharge: 2022-05-25 | Disposition: A | Discharge status: Home / Self Care | Payer: PPO | Source: Ambulatory Visit | Attending: Radiation Oncology | Admitting: Radiation Oncology

## 2022-05-25 ENCOUNTER — Ambulatory Visit: Payer: PPO

## 2022-05-25 ENCOUNTER — Other Ambulatory Visit: Payer: Self-pay

## 2022-05-25 DIAGNOSIS — D0511 Intraductal carcinoma in situ of right breast: Secondary | ICD-10-CM | POA: Diagnosis not present

## 2022-05-25 DIAGNOSIS — Z170 Estrogen receptor positive status [ER+]: Secondary | ICD-10-CM | POA: Diagnosis not present

## 2022-05-25 DIAGNOSIS — Z510 Encounter for antineoplastic radiation therapy: Secondary | ICD-10-CM | POA: Diagnosis not present

## 2022-05-25 LAB — RAD ONC ARIA SESSION SUMMARY
Course Elapsed Days: 13
Plan Fractions Treated to Date: 11
Plan Prescribed Dose Per Fraction: 2.67 Gy
Plan Total Fractions Prescribed: 15
Plan Total Prescribed Dose: 40.05 Gy
Reference Point Dosage Given to Date: 29.37 Gy
Reference Point Session Dosage Given: 2.67 Gy
Session Number: 11

## 2022-05-26 ENCOUNTER — Ambulatory Visit
Admission: RE | Admit: 2022-05-26 | Discharge: 2022-05-26 | Disposition: A | Discharge status: Home / Self Care | Payer: PPO | Source: Ambulatory Visit | Attending: Radiation Oncology | Admitting: Radiation Oncology

## 2022-05-26 ENCOUNTER — Other Ambulatory Visit: Payer: Self-pay

## 2022-05-26 DIAGNOSIS — D0511 Intraductal carcinoma in situ of right breast: Secondary | ICD-10-CM | POA: Diagnosis not present

## 2022-05-26 DIAGNOSIS — Z510 Encounter for antineoplastic radiation therapy: Secondary | ICD-10-CM | POA: Diagnosis not present

## 2022-05-26 DIAGNOSIS — Z170 Estrogen receptor positive status [ER+]: Secondary | ICD-10-CM | POA: Diagnosis not present

## 2022-05-26 LAB — RAD ONC ARIA SESSION SUMMARY
Course Elapsed Days: 14
Plan Fractions Treated to Date: 12
Plan Prescribed Dose Per Fraction: 2.67 Gy
Plan Total Fractions Prescribed: 15
Plan Total Prescribed Dose: 40.05 Gy
Reference Point Dosage Given to Date: 32.04 Gy
Reference Point Session Dosage Given: 2.67 Gy
Session Number: 12

## 2022-05-27 ENCOUNTER — Ambulatory Visit
Admission: RE | Admit: 2022-05-27 | Discharge: 2022-05-27 | Disposition: A | Discharge status: Home / Self Care | Payer: PPO | Source: Ambulatory Visit | Attending: Radiation Oncology | Admitting: Radiation Oncology

## 2022-05-27 ENCOUNTER — Other Ambulatory Visit: Payer: Self-pay

## 2022-05-27 DIAGNOSIS — Z510 Encounter for antineoplastic radiation therapy: Secondary | ICD-10-CM | POA: Diagnosis not present

## 2022-05-27 DIAGNOSIS — Z170 Estrogen receptor positive status [ER+]: Secondary | ICD-10-CM | POA: Diagnosis not present

## 2022-05-27 DIAGNOSIS — D0511 Intraductal carcinoma in situ of right breast: Secondary | ICD-10-CM | POA: Diagnosis not present

## 2022-05-27 LAB — RAD ONC ARIA SESSION SUMMARY
Course Elapsed Days: 15
Plan Fractions Treated to Date: 13
Plan Prescribed Dose Per Fraction: 2.67 Gy
Plan Total Fractions Prescribed: 15
Plan Total Prescribed Dose: 40.05 Gy
Reference Point Dosage Given to Date: 34.71 Gy
Reference Point Session Dosage Given: 2.67 Gy
Session Number: 13

## 2022-05-28 ENCOUNTER — Ambulatory Visit: Payer: PPO

## 2022-06-01 ENCOUNTER — Telehealth: Payer: Self-pay | Admitting: Genetic Counselor

## 2022-06-01 ENCOUNTER — Ambulatory Visit
Admission: RE | Admit: 2022-06-01 | Discharge: 2022-06-01 | Disposition: A | Discharge status: Home / Self Care | Payer: PPO | Source: Ambulatory Visit | Attending: Radiation Oncology | Admitting: Radiation Oncology

## 2022-06-01 ENCOUNTER — Other Ambulatory Visit: Payer: Self-pay

## 2022-06-01 ENCOUNTER — Encounter: Payer: Self-pay | Admitting: Genetic Counselor

## 2022-06-01 ENCOUNTER — Ambulatory Visit: Payer: PPO | Admitting: Radiation Oncology

## 2022-06-01 ENCOUNTER — Ambulatory Visit: Payer: PPO

## 2022-06-01 DIAGNOSIS — D0511 Intraductal carcinoma in situ of right breast: Secondary | ICD-10-CM | POA: Diagnosis not present

## 2022-06-01 DIAGNOSIS — Z510 Encounter for antineoplastic radiation therapy: Secondary | ICD-10-CM | POA: Diagnosis not present

## 2022-06-01 DIAGNOSIS — Z170 Estrogen receptor positive status [ER+]: Secondary | ICD-10-CM | POA: Diagnosis not present

## 2022-06-01 DIAGNOSIS — Z1379 Encounter for other screening for genetic and chromosomal anomalies: Secondary | ICD-10-CM | POA: Insufficient documentation

## 2022-06-01 LAB — RAD ONC ARIA SESSION SUMMARY
Course Elapsed Days: 20
Plan Fractions Treated to Date: 14
Plan Prescribed Dose Per Fraction: 2.67 Gy
Plan Total Fractions Prescribed: 15
Plan Total Prescribed Dose: 40.05 Gy
Reference Point Dosage Given to Date: 37.38 Gy
Reference Point Session Dosage Given: 2.67 Gy
Session Number: 14

## 2022-06-01 NOTE — Telephone Encounter (Signed)
I contacted Taylor Ewing to discuss her genetic testing results. No pathogenic variants were identified in the 38 genes analyzed. Detailed clinic note to follow.  The test report has been scanned into EPIC and is located under the Molecular Pathology section of the Results Review tab.  A portion of the result report is included below for reference.   [name], MS, [facility] Genetic Counselor [email or web address] (P) [phone]

## 2022-06-02 ENCOUNTER — Ambulatory Visit
Admission: RE | Admit: 2022-06-02 | Discharge: 2022-06-02 | Disposition: A | Discharge status: Home / Self Care | Payer: PPO | Source: Ambulatory Visit | Attending: Radiation Oncology | Admitting: Radiation Oncology

## 2022-06-02 ENCOUNTER — Other Ambulatory Visit: Payer: Self-pay

## 2022-06-02 DIAGNOSIS — D0511 Intraductal carcinoma in situ of right breast: Secondary | ICD-10-CM | POA: Diagnosis not present

## 2022-06-02 DIAGNOSIS — Z170 Estrogen receptor positive status [ER+]: Secondary | ICD-10-CM | POA: Diagnosis not present

## 2022-06-02 DIAGNOSIS — Z510 Encounter for antineoplastic radiation therapy: Secondary | ICD-10-CM | POA: Diagnosis not present

## 2022-06-02 LAB — RAD ONC ARIA SESSION SUMMARY
Course Elapsed Days: 21
Plan Fractions Treated to Date: 15
Plan Prescribed Dose Per Fraction: 2.67 Gy
Plan Total Fractions Prescribed: 15
Plan Total Prescribed Dose: 40.05 Gy
Reference Point Dosage Given to Date: 40.05 Gy
Reference Point Session Dosage Given: 2.67 Gy
Session Number: 15

## 2022-06-03 ENCOUNTER — Other Ambulatory Visit: Payer: Self-pay

## 2022-06-03 ENCOUNTER — Ambulatory Visit: Payer: PPO

## 2022-06-03 ENCOUNTER — Ambulatory Visit (INDEPENDENT_AMBULATORY_CARE_PROVIDER_SITE_OTHER): Payer: PPO

## 2022-06-03 ENCOUNTER — Ambulatory Visit
Admission: RE | Admit: 2022-06-03 | Discharge: 2022-06-03 | Disposition: A | Discharge status: Home / Self Care | Payer: PPO | Source: Ambulatory Visit | Attending: Radiation Oncology | Admitting: Radiation Oncology

## 2022-06-03 VITALS — Ht 64.0 in | Wt 143.0 lb

## 2022-06-03 DIAGNOSIS — Z0000 Encounter for general adult medical examination without abnormal findings: Secondary | ICD-10-CM | POA: Diagnosis not present

## 2022-06-03 DIAGNOSIS — Z170 Estrogen receptor positive status [ER+]: Secondary | ICD-10-CM | POA: Diagnosis not present

## 2022-06-03 DIAGNOSIS — Z510 Encounter for antineoplastic radiation therapy: Secondary | ICD-10-CM | POA: Diagnosis not present

## 2022-06-03 DIAGNOSIS — D0511 Intraductal carcinoma in situ of right breast: Secondary | ICD-10-CM | POA: Diagnosis not present

## 2022-06-03 LAB — RAD ONC ARIA SESSION SUMMARY
Course Elapsed Days: 22
Plan Fractions Treated to Date: 1
Plan Prescribed Dose Per Fraction: 2 Gy
Plan Total Fractions Prescribed: 5
Plan Total Prescribed Dose: 10 Gy
Reference Point Dosage Given to Date: 2 Gy
Reference Point Session Dosage Given: 2 Gy
Session Number: 16

## 2022-06-03 NOTE — Progress Notes (Signed)
Subjective:   Taylor Ewing is a 71 y.o. female who presents for Medicare Annual (Subsequent) preventive examination.  Review of Systems    No ROS.  Medicare Wellness Virtual Visit.  Visual/audio telehealth visit, UTA vital signs.   See social history for additional risk factors.   Cardiac Risk Factors include: advanced age (>70mn, >>81women);hypertension     Objective:    Today's Vitals   06/03/22 1002  Weight: 143 lb (64.9 kg)  Height: '5\' 4"'$  (1.626 m)   Body mass index is 24.55 kg/m.     06/03/2022   10:04 AM 04/27/2022    7:53 AM 04/01/2022   11:15 AM 03/20/2022    7:42 AM 11/10/2019    9:03 AM  Advanced Directives  Does Patient Have a Medical Advance Directive? No No No No No  Would patient like information on creating a medical advance directive? No - Patient declined No - Patient declined No - Patient declined  No - Patient declined    Current Medications (verified) Outpatient Encounter Medications as of 06/03/2022  Medication Sig   acetaminophen (TYLENOL) 500 MG tablet Take 500 mg by mouth every 6 (six) hours as needed for moderate pain.   albuterol (PROVENTIL HFA;VENTOLIN HFA) 108 (90 Base) MCG/ACT inhaler INHALE TWO PUFFS BY MOUTH EVERY 4 HOURS AS NEEDED FOR  WHEEZE   cetirizine (ZYRTEC) 10 MG tablet Take 1 tablet by mouth daily as needed (allergies).   Cholecalciferol (VITAMIN D) 50 MCG (2000 UT) CAPS Take 2,000 Units by mouth daily.   famotidine (PEPCID) 20 MG tablet TAKE 1 TABLET BY MOUTH AT BEDTIME   losartan (COZAAR) 100 MG tablet Take 1 tablet (100 mg total) by mouth daily.   Multiple Vitamin (MULTIVITAMIN) tablet Take 1 tablet by mouth daily.   Omega-3 Fatty Acids (FISH OIL PO) Take 1,400 mg by mouth daily.   oxyCODONE (ROXICODONE) 5 MG immediate release tablet Take 1 tablet (5 mg total) by mouth every 6 (six) hours as needed for severe pain. (Patient not taking: Reported on 04/27/2022)   sertraline (ZOLOFT) 50 MG tablet Take 1 tablet (50 mg total) by  mouth daily.   simvastatin (ZOCOR) 40 MG tablet Take 1 tablet (40 mg total) by mouth daily.   zolpidem (AMBIEN) 10 MG tablet TAKE 1 TABLET BY MOUTH AT BEDTIME AS NEEDED FOR SLEEP   No facility-administered encounter medications on file as of 06/03/2022.    Allergies (verified) Patient has no known allergies.   History: Past Medical History:  Diagnosis Date   Allergy    Anxiety    Asthma    [place]   [place]    skin, basal cell   Essential hypertension    History of kidney stones    Insomnia    Migraines    very rarely now, more prior to menopause   TMJ (dislocation of temporomandibular joint)    Past Surgical History:  Procedure Laterality Date   BREAST BIOPSY  2023   BREAST LUMPECTOMY WITH RADIOACTIVE SEED LOCALIZATION Right 04/01/2022   Procedure: RIGHT BREAST LUMPECTOMY WITH RADIOACTIVE SEED LOCALIZATION;  Surgeon: TJovita Kussmaul MD;  [facility]  Service: General;  Laterality: Right;   HAND SURGERY Left 2021   schwannoma removal   HEMORRHOID SURGERY  11/04/2003   RE-EXCISION OF BREAST CANCER,SUPERIOR MARGINS Right 04/14/2022   Procedure: RE-EXCISION OF RIGHT BREAST ANTERIOR MARGIN;  Surgeon: TJovita Kussmaul MD;  [facility] [facility]  Service:  General;  Laterality: Right;   SKIN LESION EXCISION     TUBAL LIGATION     Family History  Problem Relation Age of Onset   Depression Mother    Breast cancer Mother 74       negative genetic testing   Depression Father    Hyperlipidemia Sister    Thyroid cancer Sister 46   Hyperlipidemia Brother    Lung cancer Maternal Aunt    Breast cancer Maternal Grandmother    Heart attack Maternal Grandmother    Stomach cancer Paternal Grandfather 48   Nephrolithiasis Daughter    Colon polyps Daughter    Breast cancer Cousin 41       maternal first cousin   Social History   Socioeconomic History   Marital status: Divorced    Spouse name: Not on file   Number of children:  2   Years of education: Not on file   Highest education level: Not on file  Occupational History   Not on file  Tobacco Use   Smoking status: Never   Smokeless tobacco: Never  Vaping Use   Vaping Use: Never used  Substance and Sexual Activity   Alcohol use: Yes    Alcohol/week: 0.0 standard drinks of alcohol    Comment: 1-2 weekly   Drug use: No   Sexual activity: Yes  Other Topics Concern   Not on file  Social History Narrative   Not on file   Social Determinants of Health   Financial Resource Strain: Low Risk  (06/03/2022)   Overall Financial Resource Strain (CARDIA)    Difficulty of Paying Living Expenses: Not hard at all  Food Insecurity: No Food Insecurity (06/03/2022)   Hunger Vital Sign    Worried About Running Out of Food in the Last Year: Never true    Ran Out of Food in the Last Year: Never true  Transportation Needs: No Transportation Needs (06/03/2022)   PRAPARE - Hydrologist (Medical): No    Lack of Transportation (Non-Medical): No  Physical Activity: Sufficiently Active (06/03/2022)   Exercise Vital Sign    Days of Exercise per Week: 7 days    Minutes of Exercise per Session: 40 min  Stress: No Stress Concern Present (06/03/2022)   [place]    Feeling of Stress : Not at all  Social Connections: Unknown (06/03/2022)   Social Connection and Isolation Panel [NHANES]    Frequency of Communication with Friends and Family: More than three times a week    Frequency of Social Gatherings with Friends and Family: More than three times a week    Attends Religious Services: Never    Marine scientist or Organizations: No    Attends Music therapist: Never    Marital Status: Not on file    Tobacco Counseling Counseling given: Not Answered   Clinical Intake:  Pre-visit preparation completed: Yes        Diabetes: No  How often do you need  to have someone help you when you read instructions, pamphlets, or other written materials from your doctor or pharmacy?: 1 - Never    Interpreter Needed?: No      Activities of Daily Living    06/03/2022   10:07 AM 04/14/2022    7:06 AM  In your present state of health, do you have any difficulty performing the following activities:  Hearing? 0   Vision? 0  Difficulty concentrating or making decisions? 0   Walking or climbing stairs? 0   Dressing or bathing? 0   Doing errands, shopping? 0 0  Preparing Food and eating ? N   Using the Toilet? N   In the past six months, have you accidently leaked urine? N   Do you have problems with loss of bowel control? N   Managing your Medications? N   Managing your Finances? N   Housekeeping or managing your Housekeeping? N     Patient Care Team: [place], Wynelle Fanny, MD as PCP - Philomena Doheny, Paulette Blanch, RN as Oncology Nurse Navigator Rockwell Germany, RN as Oncology Nurse Navigator  Indicate any recent Medical Services you may have received from other than [facility] providers in the past year (date may be approximate).     Assessment:   This is a routine wellness examination for Dante.  I connected with  Jenell Milliner on 06/03/22 by a audio enabled telemedicine application and verified that I am speaking with the correct person using two identifiers.  Patient Location: Home  Provider Location: Office/Clinic  I discussed the limitations of evaluation and management by telemedicine. The patient expressed understanding and agreed to proceed.   Hearing/Vision screen Hearing Screening - Comments:: Patient is able to hear conversational tones without difficulty.  No issues reported.   Vision Screening - Comments:: Followed by [facility] Wears corrective lenses They have seen their ophthalmologist in the last 12 months.    Dietary issues and exercise activities discussed: Current Exercise Habits: Home exercise routine, Type  of exercise: walking, Time (Minutes): 40, Frequency (Times/Week): 7, Weekly Exercise (Minutes/Week): 280, Intensity: Moderate Healthy diet Good water intake   Goals Addressed               This Visit's Progress     Patient Stated     I want to snack smart (pt-stated)        Portion control      Other     Patient Stated   On track     11/10/2019, I will continue to walk 4-5 days a week for 2-3 miles.        Depression Screen    06/03/2022   10:09 AM 07/15/2021    8:54 AM 11/10/2019    9:04 AM 11/19/2017   12:29 PM 06/15/2012    2:58 PM  PHQ 2/9 Scores  PHQ - 2 Score 0 0 0 0 0  PHQ- 9 Score   0      Fall Risk    06/03/2022   10:13 AM 07/15/2021    8:53 AM 11/10/2019    9:03 AM 02/03/2019   12:05 PM  [place] in the past year? 0 0 0 0  Comment    Emmi Telephone Survey: data to providers prior to load  Number falls in past yr: 0 0 0   Injury with Fall? 0  0   Risk for fall due to : No Fall Risks  No Fall Risks   Follow up Falls evaluation completed;Falls prevention discussed Falls evaluation completed Falls evaluation completed;Falls prevention discussed    FALL RISK PREVENTION PERTAINING TO THE HOME: Home free of loose throw rugs in walkways, pet beds, electrical cords, etc? Yes  Adequate lighting in your home to reduce risk of falls? Yes   ASSISTIVE DEVICES UTILIZED TO PREVENT FALLS: Life alert? No  Use of a cane, walker or w/c? No  Grab bars in  the bathroom? No  Shower chair or bench in shower? No  Elevated toilet seat or a handicapped toilet? No   TIMED UP AND GO: Was the test performed? No .   Cognitive Function:    11/10/2019    9:12 AM  MMSE - Mini Mental State Exam  Orientation to time 5  Orientation to Place 5  Registration 3  Attention/ Calculation 5  Recall 3  Language- repeat 1        06/03/2022   10:14 AM  6CIT Screen  What Year? 0 points  What month? 0 points  What time? 0 points  Count back from 20 0 points  Months in  reverse 0 points  Repeat phrase 0 points  Total Score 0 points    Immunizations Immunization History  Administered Date(s) Administered   Fluad Quad(high Dose 65+) 06/06/2019, 04/24/2022   Influenza Split 06/15/2012   Influenza, High Dose Seasonal PF 07/12/2018   Influenza,inj,Quad PF,6+ Mos 08/13/2014, 09/03/2015   Influenza-Unspecified 05/15/2021   PFIZER(Purple Top)SARS-COV-2 Vaccination 07/27/2019, 08/17/2019, 04/19/2020, 12/20/2020   Pfizer Covid-19 Vaccine Bivalent Booster 30yr & up 04/24/2022   Pneumococcal Conjugate-13 06/06/2019   Pneumococcal Polysaccharide-23 11/13/2019   Respiratory Syncytial Virus Vaccine,Recomb Aduvanted(Arexvy) 04/24/2022   Td 07/30/2003   Tdap 08/13/2014   Zoster Recombinat (Shingrix) 06/06/2019, 10/24/2019   Covid-19 vaccine status: Completed vaccines  Screening Tests Health Maintenance  Topic Date Due   COVID-19 Vaccine (6 - 2023-24 season) 06/19/2022   MAMMOGRAM  02/24/2023   Medicare Annual Wellness (AWV)  06/04/2023   COLONOSCOPY (Pts 45-463yrInsurance coverage will need to be confirmed)  02/01/2024   Pneumonia Vaccine 6513Years old  Completed   INFLUENZA VACCINE  Completed   DEXA SCAN  Completed   Hepatitis C Screening  Completed   Zoster Vaccines- Shingrix  Completed   HPV VACCINES  Aged Out    Health Maintenance There are no preventive care reminders to display for this patient.  Lung Cancer Screening: (Low Dose CT Chest recommended if Age 69-80ears, 30 pack-year currently smoking OR have quit w/in 15years.) does not qualify.   Hepatitis C Screening: Completed 2017.  Vision Screening: Recommended annual ophthalmology exams for early detection of glaucoma and other disorders of the eye.  Dental Screening: Recommended annual dental exams for proper oral hygiene. Sees periodontist also.   Community Resource Referral / Chronic Care Management: CRR required this visit?  No   CCM required this visit?  No      Plan:      I have personally reviewed and noted the following in the patient's chart:   Medical and social history Use of alcohol, tobacco or illicit drugs  Current medications and supplements including opioid prescriptions. Patient is currently taking opioid prescriptions. Information provided to patient regarding non-opioid alternatives. Patient advised to discuss non-opioid treatment plan with their provider. Followed by ToJovita KussmaulMD Functional ability and status Nutritional status Physical activity Advanced directives List of other physicians Hospitalizations, surgeries, and ER visits in previous 12 months Vitals Screenings to include cognitive, depression, and falls Referrals and appointments  In addition, I have reviewed and discussed with patient certain preventive protocols, quality metrics, and best practice recommendations. A written personalized care plan for preventive services as well as general preventive health recommendations were provided to patient.     DeLeta JunglingLPN   [id number]

## 2022-06-03 NOTE — Patient Instructions (Addendum)
Taylor Ewing , Thank you for taking time to come for your Medicare Wellness Visit. I appreciate your ongoing commitment to your health goals. Please review the following plan we discussed and let me know if I can assist you in the future.   These are the goals we discussed:  Goals       Patient Stated     I want to snack smart (pt-stated)      Portion control      Other     Patient Stated      11/10/2019, I will continue to walk 4-5 days a week for 2-3 miles.         This is a list of the screening recommended for you and due dates:  Health Maintenance  Topic Date Due   COVID-19 Vaccine (6 - 2023-24 season) 06/19/2022   Mammogram  02/24/2023   Medicare Annual Wellness Visit  06/04/2023   Colon Cancer Screening  02/01/2024   Pneumonia Vaccine  Completed   Flu Shot  Completed   DEXA scan (bone density measurement)  Completed   Hepatitis C Screening: USPSTF Recommendation to screen - Ages 84-79 yo.  Completed   Zoster (Shingles) Vaccine  Completed   HPV Vaccine  Aged Out    Advanced directives: End of life planning; Advance aging; Advanced directives discussed.  Copy of current HCPOA/Living Will requested.    Conditions/risks identified: none new.  Next appointment: Follow up in one year for your annual wellness visit    Preventive Care 65 Years and Older, Female Preventive care refers to lifestyle choices and visits with your health care provider that can promote health and wellness. What does preventive care include? A yearly physical exam. This is also called an annual well check. Dental exams once or twice a year. Routine eye exams. Ask your health care provider how often you should have your eyes checked. Personal lifestyle choices, including: Daily care of your teeth and gums. Regular physical activity. Eating a healthy diet. Avoiding tobacco and drug use. Limiting alcohol use. Practicing safe sex. Taking low-dose aspirin every day. Taking vitamin and mineral  supplements as recommended by your health care provider. What happens during an annual well check? The services and screenings done by your health care provider during your annual well check will depend on your age, overall health, lifestyle risk factors, and family history of disease. Counseling  Your health care provider may ask you questions about your: Alcohol use. Tobacco use. Drug use. Emotional well-being. Home and relationship well-being. Sexual activity. Eating habits. History of falls. Memory and ability to understand (cognition). Work and work Statistician. Reproductive health. Screening  You may have the following tests or measurements: Height, weight, and BMI. Blood pressure. Lipid and cholesterol levels. These may be checked every 5 years, or more frequently if you are over 4 years old. Skin check. Lung cancer screening. You may have this screening every year starting at age 27 if you have a 30-pack-year history of smoking and currently smoke or have quit within the past 15 years. Fecal occult blood test (FOBT) of the stool. You may have this test every year starting at age 32. Flexible sigmoidoscopy or colonoscopy. You may have a sigmoidoscopy every 5 years or a colonoscopy every 10 years starting at age 4. Hepatitis C blood test. Hepatitis B blood test. Sexually transmitted disease (STD) testing. Diabetes screening. This is done by checking your blood sugar (glucose) after you have not eaten for a while (fasting). You may  have this done every 1-3 years. Bone density scan. This is done to screen for osteoporosis. You may have this done starting at age 8. Mammogram. This may be done every 1-2 years. Talk to your health care provider about how often you should have regular mammograms. Talk with your health care provider about your test results, treatment options, and if necessary, the need for more tests. Vaccines  Your health care provider may recommend certain  vaccines, such as: Influenza vaccine. This is recommended every year. Tetanus, diphtheria, and acellular pertussis (Tdap, Td) vaccine. You may need a Td booster every 10 years. Zoster vaccine. You may need this after age 53. Pneumococcal 13-valent conjugate (PCV13) vaccine. One dose is recommended after age 29. Pneumococcal polysaccharide (PPSV23) vaccine. One dose is recommended after age 25. Talk to your health care provider about which screenings and vaccines you need and how often you need them. This information is not intended to replace advice given to you by your health care provider. Make sure you discuss any questions you have with your health care provider. Document Released: 07/19/2015 Document Revised: 03/11/2016 Document Reviewed: 04/23/2015 Elsevier Interactive Patient Education  2017 [place] Prevention in the Home Falls can cause injuries. They can happen to people of all ages. There are many things you can do to make your home safe and to help prevent falls. What can I do on the outside of my home? Regularly fix the edges of walkways and driveways and fix any cracks. Remove anything that might make you trip as you walk through a door, such as a raised step or threshold. Trim any bushes or trees on the path to your home. Use bright outdoor lighting. Clear any walking paths of anything that might make someone trip, such as rocks or tools. Regularly check to see if handrails are loose or broken. Make sure that both sides of any steps have handrails. Any raised decks and porches should have guardrails on the edges. Have any leaves, snow, or ice cleared regularly. Use sand or salt on walking paths during winter. Clean up any spills in your garage right away. This includes oil or grease spills. What can I do in the bathroom? Use night lights. Install grab bars by the toilet and in the tub and shower. Do not use towel bars as grab bars. Use non-skid mats or decals in  the tub or shower. If you need to sit down in the shower, use a plastic, non-slip stool. Keep the floor dry. Clean up any water that spills on the floor as soon as it happens. Remove soap buildup in the tub or shower regularly. Attach bath mats securely with double-sided non-slip rug tape. Do not have throw rugs and other things on the floor that can make you trip. What can I do in the bedroom? Use night lights. Make sure that you have a light by your bed that is easy to reach. Do not use any sheets or blankets that are too big for your bed. They should not hang down onto the floor. Have a firm chair that has side arms. You can use this for support while you get dressed. Do not have throw rugs and other things on the floor that can make you trip. What can I do in the kitchen? Clean up any spills right away. Avoid walking on wet floors. Keep items that you use a lot in easy-to-reach places. If you need to reach something above you, use a strong step  stool that has a grab bar. Keep electrical cords out of the way. Do not use floor polish or wax that makes floors slippery. If you must use wax, use non-skid floor wax. Do not have throw rugs and other things on the floor that can make you trip. What can I do with my stairs? Do not leave any items on the stairs. Make sure that there are handrails on both sides of the stairs and use them. Fix handrails that are broken or loose. Make sure that handrails are as long as the stairways. Check any carpeting to make sure that it is firmly attached to the stairs. Fix any carpet that is loose or worn. Avoid having throw rugs at the top or bottom of the stairs. If you do have throw rugs, attach them to the floor with carpet tape. Make sure that you have a light switch at the top of the stairs and the bottom of the stairs. If you do not have them, ask someone to add them for you. What else can I do to help prevent falls? Wear shoes that: Do not have high  heels. Have rubber bottoms. Are comfortable and fit you well. Are closed at the toe. Do not wear sandals. If you use a stepladder: Make sure that it is fully opened. Do not climb a closed stepladder. Make sure that both sides of the stepladder are locked into place. Ask someone to hold it for you, if possible. Clearly mark and make sure that you can see: Any grab bars or handrails. First and last steps. Where the edge of each step is. Use tools that help you move around (mobility aids) if they are needed. These include: Canes. Walkers. Scooters. Crutches. Turn on the lights when you go into a dark area. Replace any light bulbs as soon as they burn out. Set up your furniture so you have a clear path. Avoid moving your furniture around. If any of your floors are uneven, fix them. If there are any pets around you, be aware of where they are. Review your medicines with your doctor. Some medicines can make you feel dizzy. This can increase your chance of falling. Ask your doctor what other things that you can do to help prevent falls. This information is not intended to replace advice given to you by your health care provider. Make sure you discuss any questions you have with your health care provider. Document Released: 04/18/2009 Document Revised: 11/28/2015 Document Reviewed: 07/27/2014 Elsevier Interactive Patient Education  2017 [place].  Opioid Pain Medicine Management Opioids are powerful medicines that are used to treat moderate to severe pain. When used for short periods of time, they can help you to: Sleep better. Do better in physical or occupational therapy. Feel better in the first few days after an injury. Recover from surgery. Opioids should be taken with the supervision of a trained health care provider. They should be taken for the shortest period of time possible. This is because opioids can be addictive, and the longer you take opioids, the greater your risk of  addiction. This addiction can also be called opioid use disorder. What are the risks? Using opioid pain medicines for longer than 3 days increases your risk of side effects. Side effects include: Constipation. Nausea and vomiting. Breathing difficulties (respiratory depression). Drowsiness. Confusion. Opioid use disorder. Itching. Taking opioid pain medicine for a long period of time can affect your ability to do daily tasks. It also puts you at risk for:  Motor vehicle crashes. Depression. Suicide. Heart attack. Overdose, which can be life-threatening. What is a pain treatment plan? A pain treatment plan is an agreement between you and your health care provider. Pain is unique to each person, and treatments vary depending on your condition. To manage your pain, you and your health care provider need to work together. To help you do this: Discuss the goals of your treatment, including how much pain you might expect to have and how you will manage the pain. Review the risks and benefits of taking opioid medicines. Remember that a good treatment plan uses more than one approach and minimizes the chance of side effects. Be honest about the amount of medicines you take and about any drug or alcohol use. Get pain medicine prescriptions from only one health care provider. Pain can be managed with many types of alternative treatments. Ask your health care provider to refer you to one or more specialists who can help you manage pain through: Physical or occupational therapy. Counseling (cognitive behavioral therapy). Good nutrition. Biofeedback. Massage. Meditation. Non-opioid medicine. Following a gentle exercise program. How to use opioid pain medicine Taking medicine Take your pain medicine exactly as told by your health care provider. Take it only when you need it. If your pain gets less severe, you may take less than your prescribed dose if your health care provider approves. If you  are not having pain, do nottake pain medicine unless your health care provider tells you to take it. If your pain is severe, do nottry to treat it yourself by taking more pills than instructed on your prescription. Contact your health care provider for help. Write down the times when you take your pain medicine. It is easy to become confused while on pain medicine. Writing the time can help you avoid overdose. Take other over-the-counter or prescription medicines only as told by your health care provider. Keeping yourself and others safe  While you are taking opioid pain medicine: Do not drive, use machinery, or power tools. Do not sign legal documents. Do not drink alcohol. Do not take sleeping pills. Do not supervise children by yourself. Do not do activities that require climbing or being in high places. Do not go to a lake, river, ocean, spa, or swimming pool. Do not share your pain medicine with anyone. Keep pain medicine in a locked cabinet or in a secure area where pets and children cannot reach it. Stopping your use of opioids If you have been taking opioid medicine for more than a few weeks, you may need to slowly decrease (taper) how much you take until you stop completely. Tapering your use of opioids can decrease your risk of symptoms of withdrawal, such as: Pain and cramping in the abdomen. Nausea. Sweating. Sleepiness. Restlessness. Uncontrollable shaking (tremors). Cravings for the medicine. Do not attempt to taper your use of opioids on your own. Talk with your health care provider about how to do this. Your health care provider may prescribe a step-down schedule based on how much medicine you are taking and how long you have been taking it. Getting rid of leftover pills Do not save any leftover pills. Get rid of leftover pills safely by: Taking the medicine to a prescription take-back program. This is usually offered by the county or law enforcement. Bringing them to a  pharmacy that has a drug disposal container. Flushing them down the toilet. Check the label or package insert of your medicine to see whether this is safe to  do. Throwing them out in the trash. Check the label or package insert of your medicine to see whether this is safe to do. If it is safe to throw it out, remove the medicine from the original container, put it into a sealable bag or container, and mix it with used coffee grounds, food scraps, dirt, or cat litter before putting it in the trash. Follow these instructions at home: Activity Do exercises as told by your health care provider. Avoid activities that make your pain worse. Return to your normal activities as told by your health care provider. Ask your health care provider what activities are safe for you. General instructions You may need to take these actions to prevent or treat constipation: Drink enough fluid to keep your urine pale yellow. Take over-the-counter or prescription medicines. Eat foods that are high in fiber, such as beans, whole grains, and fresh fruits and vegetables. Limit foods that are high in fat and processed sugars, such as fried or sweet foods. Keep all follow-up visits. This is important. Where to find support If you have been taking opioids for a long time, you may benefit from receiving support for quitting from a local support group or counselor. Ask your health care provider for a referral to these resources in your area. Where to find more information Centers for Disease Control and Prevention (CDC): http://www.wolf.info/ [place] Food and Drug Administration (FDA): [place] Get help right away if: You may have taken too much of an opioid (overdosed). Common symptoms of an overdose: Your breathing is slower or more shallow than normal. You have a very slow heartbeat (pulse). You have slurred speech. You have nausea and vomiting. Your pupils become very small. You have other potential symptoms: You are very  confused. You faint or feel like you will faint. You have cold, clammy skin. You have blue lips or fingernails. You have thoughts of harming yourself or harming others. These symptoms may represent a serious problem that is an emergency. Do not wait to see if the symptoms will go away. Get medical help right away. Call your local emergency services (911 in the [place] Do not drive yourself to the hospital.  If you ever feel like you may hurt yourself or others, or have thoughts about taking your own life, get help right away. Go to your nearest emergency department or: Call your local emergency services (911 in the [place] Call the [facility] 873-505-2821 in the [place] Call a suicide crisis helpline, such as the [place] at 775 099 7334 or 988 in the [place]. This is open 24 hours a day in the [place] Text the Crisis Text Line at 531-876-1936 (in the [place].). Summary Opioid medicines can help you manage moderate to severe pain for a short period of time. A pain treatment plan is an agreement between you and your health care provider. Discuss the goals of your treatment, including how much pain you might expect to have and how you will manage the pain. If you think that you or someone else may have taken too much of an opioid, get medical help right away. This information is not intended to replace advice given to you by your health care provider. Make sure you discuss any questions you have with your health care provider. Document Revised: 01/15/2021 Document Reviewed: 10/02/2020 Elsevier Patient Education  [place].

## 2022-06-04 ENCOUNTER — Ambulatory Visit: Payer: PPO

## 2022-06-04 ENCOUNTER — Ambulatory Visit
Admission: RE | Admit: 2022-06-04 | Discharge: 2022-06-04 | Disposition: A | Discharge status: Home / Self Care | Payer: PPO | Source: Ambulatory Visit | Attending: Radiation Oncology | Admitting: Radiation Oncology

## 2022-06-04 ENCOUNTER — Other Ambulatory Visit: Payer: Self-pay

## 2022-06-04 DIAGNOSIS — D0511 Intraductal carcinoma in situ of right breast: Secondary | ICD-10-CM | POA: Diagnosis not present

## 2022-06-04 LAB — RAD ONC ARIA SESSION SUMMARY
Course Elapsed Days: 23
Plan Fractions Treated to Date: 2
Plan Prescribed Dose Per Fraction: 2 Gy
Plan Total Fractions Prescribed: 5
Plan Total Prescribed Dose: 10 Gy
Reference Point Dosage Given to Date: 4 Gy
Reference Point Session Dosage Given: 2 Gy
Session Number: 17

## 2022-06-05 ENCOUNTER — Other Ambulatory Visit: Payer: Self-pay

## 2022-06-05 ENCOUNTER — Ambulatory Visit
Admission: RE | Admit: 2022-06-05 | Discharge: 2022-06-05 | Disposition: A | Discharge status: Home / Self Care | Payer: PPO | Source: Ambulatory Visit | Attending: Radiation Oncology | Admitting: Radiation Oncology

## 2022-06-05 DIAGNOSIS — D0511 Intraductal carcinoma in situ of right breast: Secondary | ICD-10-CM | POA: Insufficient documentation

## 2022-06-05 LAB — RAD ONC ARIA SESSION SUMMARY
Course Elapsed Days: 24
Plan Fractions Treated to Date: 3
Plan Prescribed Dose Per Fraction: 2 Gy
Plan Total Fractions Prescribed: 5
Plan Total Prescribed Dose: 10 Gy
Reference Point Dosage Given to Date: 6 Gy
Reference Point Session Dosage Given: 2 Gy
Session Number: 18

## 2022-06-08 ENCOUNTER — Other Ambulatory Visit: Payer: Self-pay

## 2022-06-08 ENCOUNTER — Encounter: Payer: Self-pay | Admitting: *Deleted

## 2022-06-08 ENCOUNTER — Ambulatory Visit: Payer: Self-pay | Admitting: Genetic Counselor

## 2022-06-08 ENCOUNTER — Ambulatory Visit: Payer: PPO

## 2022-06-08 ENCOUNTER — Ambulatory Visit
Admission: RE | Admit: 2022-06-08 | Discharge: 2022-06-08 | Disposition: A | Discharge status: Home / Self Care | Payer: PPO | Source: Ambulatory Visit | Attending: Radiation Oncology | Admitting: Radiation Oncology

## 2022-06-08 DIAGNOSIS — D0511 Intraductal carcinoma in situ of right breast: Secondary | ICD-10-CM

## 2022-06-08 DIAGNOSIS — Z1379 Encounter for other screening for genetic and chromosomal anomalies: Secondary | ICD-10-CM

## 2022-06-08 LAB — RAD ONC ARIA SESSION SUMMARY
Course Elapsed Days: 27
Plan Fractions Treated to Date: 4
Plan Prescribed Dose Per Fraction: 2 Gy
Plan Total Fractions Prescribed: 5
Plan Total Prescribed Dose: 10 Gy
Reference Point Dosage Given to Date: 8 Gy
Reference Point Session Dosage Given: 2 Gy
Session Number: 19

## 2022-06-08 MED ORDER — RADIAPLEXRX EX GEL: Freq: Once | CUTANEOUS | Status: AC

## 2022-06-08 NOTE — Progress Notes (Signed)
HPI:   Taylor Ewing was previously seen in the [facility] clinic due to a personal and family history of cancer and concerns regarding a hereditary predisposition to cancer. Please refer to our prior cancer genetics clinic note for more information regarding our discussion, assessment and recommendations, at the time. Taylor Ewing's recent genetic test results were disclosed to her, as were recommendations warranted by these results. These results and recommendations are discussed in more detail below.  CANCER HISTORY:  Oncology History  Ductal carcinoma in situ (DCIS) of right breast  02/23/2022 Mammogram   3D screening mammogram showed indeterminate right breast calcifications.   03/12/2022 Pathology Results   Pathology from the right breast lumpectomy showed intermediate grade DCIS, negative for invasive carcinoma.  Prognostic showed ER 95% positive strong staining PR 95% positive strong staining   03/20/2022 Initial Diagnosis   Ductal carcinoma in situ (DCIS) of right breast   04/01/2022 Definitive Surgery   She had right breast lumpectomy on September 27 which once again showed a 38 mm measuring DCIS, intermediate grade, foci of atypical ductal epithelium seen at the anterior margin and close proximity to DCIS.  Questionable positive margin.    Genetic Testing   [facility] CustomNext+RNA was Negative. Report date is 05/27/2022.  The CustomNext gene panel offered by [employer] includes sequencing, rearrangement analysis, and RNA analysis for the following 38 genes:  APC, ATM, AXIN2, BARD1, BMPR1A, BRCA1, BRCA2, BRIP1, CDH1, CDK4, CDKN2A, CHEK2, DICER1, HOXB13, EPCAM, GREM1, MLH1, MSH2, MSH3, MSH6, MUTYH, NBN, NF1, NTHL1, PALB2, PMS2, POLD1, POLE, PRKAR1A, PTEN, RAD51C, RAD51D, RECQL, RET, SMAD4, SMARCA4, STK11, and TP53.      FAMILY HISTORY:  We obtained a detailed, 4-generation family history.  Significant diagnoses are listed below:      Family History  Problem Relation  Age of Onset   Depression Mother     Breast cancer Mother     Depression Father     Hyperlipidemia Sister     Cancer Sister          thyroid   Hyperlipidemia Brother     Cancer Maternal Grandmother          breast   Heart attack Maternal Grandmother     Cancer Maternal Grandfather          stomach   Nephrolithiasis Daughter     Colon polyps Daughter     Cancer Other          1st Ewing, breast cancer           Ms. Losee's sister was diagnosed with thyroid cancer at age 28. He mother was diagnosed with breast cancer at age 1 and reportedly had negative genetic testing. Her maternal aunt was diagnosed with lung cancer in her 16s, she died at age 83. This aunt's daughter (Taylor Ewing's first Ewing) was diagnosed with breast cancer at age 22. Taylor Ewing's maternal grandmother was diagnosed with breast cancer in her 57s, she died at age 35. Her maternal great grandmother (grandmother's mother) was diagnosed with breast cancer in her 12s/70s, she is deceased.    Taylor Ewing's paternal aunt was diagnosed with breast cancer at an unknown age, she is deceased. A second paternal aunt was diagnosed with a cancerous tumor on her leg at an unknown age, she is deceased. Her paternal grandfather was diagnosed with stomach cancer at age 40, he died in his late 70s. There is no reported Ashkenazi Jewish ancestry.  GENETIC TEST RESULTS:  The [employer] CustomNext Panel  found no pathogenic mutations.  The CustomNext gene panel offered by [employer] includes sequencing, rearrangement analysis, and RNA analysis for the following 38 genes:   APC, ATM, AXIN2, BARD1, BMPR1A, BRCA1, BRCA2, BRIP1, CDH1, CDK4, CDKN2A, CHEK2, DICER1, HOXB13, EPCAM, GREM1, MLH1, MSH2, MSH3, MSH6, MUTYH, NBN, NF1, NTHL1, PALB2, PMS2, POLD1, POLE, PRKAR1A, PTEN, RAD51C, RAD51D, RECQL, RET, SMAD4, SMARCA4, STK11, and TP53.    The test report has been scanned into EPIC and is located under the Molecular Pathology section of  the Results Review tab.  A portion of the result report is included below for reference. Genetic testing reported out on 05/27/2022.        Even though a pathogenic variant was not identified, possible explanations for the cancer in the family may include: There may be no hereditary risk for cancer in the family. The cancers in Taylor Ewing and/or her family may be due to other genetic or environmental factors. There may be a gene mutation in one of these genes that current testing methods cannot detect, but that chance is small. There could be another gene that has not yet been discovered, or that we have not yet tested, that is responsible for the cancer diagnoses in the family.  It is also possible there is a hereditary cause for the cancer in the family that [name] did not inherit.  Therefore, it is important to remain in touch with cancer genetics in the future so that we can continue to offer Ms. Alvelo the most up to date genetic testing.   ADDITIONAL GENETIC TESTING:  We discussed with Taylor Ewing that her genetic testing was fairly extensive.  If there are genes identified to increase cancer risk that can be analyzed in the future, we would be happy to discuss and coordinate this testing at that time.      CANCER SCREENING RECOMMENDATIONS:  Taylor Ewing's test result is considered negative (normal).  This means that we have not identified a hereditary cause for her personal and family history of cancer at this time.   An individual's cancer risk and medical management are not determined by genetic test results alone. Overall cancer risk assessment incorporates additional factors, including personal medical history, family history, and any available genetic information that may result in a personalized plan for cancer prevention and surveillance. Therefore, it is recommended she continue to follow the cancer management and screening guidelines provided by her oncology and  primary healthcare provider.  RECOMMENDATIONS FOR FAMILY MEMBERS:   Since she did not inherit a mutation in a cancer predisposition gene included on this panel, her children could not have inherited a mutation from her in one of these genes. Individuals in this family might be at some increased risk of developing cancer, over the general population risk, due to the family history of cancer. We recommend women in this family have a yearly mammogram beginning at age 4, or 54 years younger than the earliest onset of cancer, an annual clinical breast exam, and perform monthly breast self-exams.  FOLLOW-UP:  Cancer genetics is a rapidly advancing field and it is possible that new genetic tests will be appropriate for her and/or her family members in the future. We encouraged her to remain in contact with cancer genetics on an annual basis so we can update her personal and family histories and let her know of advances in cancer genetics that may benefit this family.   Our contact number was provided. Ms. Hinton's questions were answered to her  satisfaction, and she knows she is welcome to call us at anytime with additional questions or concerns.   [name], MS, [facility] Genetic Counselor [email or web address] (P) [phone]

## 2022-06-09 ENCOUNTER — Ambulatory Visit
Admission: RE | Admit: 2022-06-09 | Discharge: 2022-06-09 | Disposition: A | Discharge status: Home / Self Care | Payer: PPO | Source: Ambulatory Visit | Attending: Radiation Oncology | Admitting: Radiation Oncology

## 2022-06-09 ENCOUNTER — Ambulatory Visit: Payer: PPO

## 2022-06-09 ENCOUNTER — Other Ambulatory Visit: Payer: Self-pay

## 2022-06-09 DIAGNOSIS — Z510 Encounter for antineoplastic radiation therapy: Secondary | ICD-10-CM | POA: Diagnosis not present

## 2022-06-09 DIAGNOSIS — Z170 Estrogen receptor positive status [ER+]: Secondary | ICD-10-CM | POA: Diagnosis not present

## 2022-06-09 DIAGNOSIS — D0511 Intraductal carcinoma in situ of right breast: Secondary | ICD-10-CM | POA: Diagnosis not present

## 2022-06-09 LAB — RAD ONC ARIA SESSION SUMMARY
Course Elapsed Days: 28
Plan Fractions Treated to Date: 5
Plan Prescribed Dose Per Fraction: 2 Gy
Plan Total Fractions Prescribed: 5
Plan Total Prescribed Dose: 10 Gy
Reference Point Dosage Given to Date: 10 Gy
Reference Point Session Dosage Given: 2 Gy
Session Number: 20

## 2022-06-10 ENCOUNTER — Ambulatory Visit: Payer: PPO

## 2022-06-11 ENCOUNTER — Encounter: Payer: Self-pay | Admitting: Genetic Counselor

## 2022-06-19 ENCOUNTER — Encounter: Payer: Self-pay | Admitting: Hematology and Oncology

## 2022-06-19 ENCOUNTER — Inpatient Hospital Stay: Payer: PPO | Attending: Hematology and Oncology | Admitting: Hematology and Oncology

## 2022-06-19 VITALS — BP 142/81 | HR 76 | Temp 97.9°F | Resp 14 | Ht 64.0 in | Wt 146.9 lb

## 2022-06-19 DIAGNOSIS — D0511 Intraductal carcinoma in situ of right breast: Secondary | ICD-10-CM

## 2022-06-19 DIAGNOSIS — Z79899 Other long term (current) drug therapy: Secondary | ICD-10-CM | POA: Insufficient documentation

## 2022-06-19 DIAGNOSIS — Z170 Estrogen receptor positive status [ER+]: Secondary | ICD-10-CM | POA: Insufficient documentation

## 2022-06-19 MED ORDER — TAMOXIFEN CITRATE 20 MG PO TABS: 20.0000 mg | ORAL_TABLET | Freq: Every day | ORAL | 3 refills | Status: DC

## 2022-06-19 NOTE — Assessment & Plan Note (Signed)
This is a very pleasant 71 year old female patient with newly diagnosed DCIS, ER/PR positive status postlumpectomy, reexcision and adjuvant radiation referred to discuss adjuvant recommendations. I have once again discussed about mechanism of action of tamoxifen versus aromatase inhibitors, adverse effects with each class today in detail.  We have reviewed adverse effects of tamoxifen including but not limited to postmenopausal symptoms, DVT/PE, endometrial hyperplasia, endometrial cancer patient would like to proceed with tamoxifen.  She understands that tamoxifen interaction point that which reduces the effectiveness of tamoxifen.  She still feels comfortable with this drug.  She tells me that several of her family members and friends used it and they had good experience.  She understands that we can always switch her to aromatase inhibitors if she is concerned about continuing tamoxifen.  She will return to clinic in 3 months with survivorship clinic

## 2022-06-19 NOTE — Progress Notes (Signed)
[facility] CONSULT NOTE  Patient Care Team: Tower, Wynelle Fanny, MD as PCP - Philomena Doheny, Paulette Blanch, RN as Oncology Nurse Navigator Rockwell Germany, RN as Oncology Nurse Navigator  CHIEF COMPLAINTS/PURPOSE OF CONSULTATION:  Newly diagnosed breast cancer  HISTORY OF PRESENTING ILLNESS:  Taylor Ewing 71 y.o. female is here because of recent diagnosis of right breast DCIS  I reviewed her records extensively and collaborated the history with the patient.  SUMMARY OF ONCOLOGIC HISTORY: Oncology History  Ductal carcinoma in situ (DCIS) of right breast  02/23/2022 Mammogram   3D screening mammogram showed indeterminate right breast calcifications.   03/12/2022 Pathology Results   Pathology from the right breast lumpectomy showed intermediate grade DCIS, negative for invasive carcinoma.  Prognostic showed ER 95% positive strong staining PR 95% positive strong staining   03/20/2022 Initial Diagnosis   Ductal carcinoma in situ (DCIS) of right breast   04/01/2022 Definitive Surgery   She had right breast lumpectomy on September 27 which once again showed a 38 mm measuring DCIS, intermediate grade, foci of atypical ductal epithelium seen at the anterior margin and close proximity to DCIS.  Questionable positive margin.    Genetic Testing   [facility] CustomNext+RNA was Negative. Report date is 05/27/2022.  The CustomNext gene panel offered by [employer] includes sequencing, rearrangement analysis, and RNA analysis for the following 38 genes:  APC, ATM, AXIN2, BARD1, BMPR1A, BRCA1, BRCA2, BRIP1, CDH1, CDK4, CDKN2A, CHEK2, DICER1, HOXB13, EPCAM, GREM1, MLH1, MSH2, MSH3, MSH6, MUTYH, NBN, NF1, NTHL1, PALB2, PMS2, POLD1, POLE, PRKAR1A, PTEN, RAD51C, RAD51D, RECQL, RET, SMAD4, SMARCA4, STK11, and TP53.     Interval history  Taylor Ewing is here for follow-up.  She has done well with adjuvant radiation.  She is ready for antiestrogen therapy. Rest of the pertinent 10 point ROS reviewed  and negative  MEDICAL HISTORY:  Past Medical History:  Diagnosis Date   Allergy    Anxiety    Asthma    Breast cancer ([place])    right breast   Cancer ([place])    skin, basal cell   Essential hypertension    History of kidney stones    Insomnia    Migraines    very rarely now, more prior to menopause   TMJ (dislocation of temporomandibular joint)     SURGICAL HISTORY: Past Surgical History:  Procedure Laterality Date   BREAST BIOPSY  2023   BREAST LUMPECTOMY WITH RADIOACTIVE SEED LOCALIZATION Right 04/01/2022   Procedure: RIGHT BREAST LUMPECTOMY WITH RADIOACTIVE SEED LOCALIZATION;  Surgeon: Jovita Kussmaul, MD;  Location: [facility];  Service: General;  Laterality: Right;   HAND SURGERY Left 2021   schwannoma removal   HEMORRHOID SURGERY  11/04/2003   RE-EXCISION OF BREAST CANCER,SUPERIOR MARGINS Right 04/14/2022   Procedure: RE-EXCISION OF RIGHT BREAST ANTERIOR MARGIN;  Surgeon: Jovita Kussmaul, MD;  Location: [facility];  Service: General;  Laterality: Right;   SKIN LESION EXCISION     TUBAL LIGATION     Daughters are 61 and 55. HRT: None Birth control pills: None Age at menarche: 44 Age at first birth : 38   SOCIAL HISTORY: Social History   Socioeconomic History   Marital status: Divorced    Spouse name: Not on file   Number of children: 2   Years of education: Not on file   Highest education level: Not on file  Occupational History   Not on file  Tobacco Use   Smoking status: Never  Smokeless tobacco: Never  Vaping Use   Vaping Use: Never used  Substance and Sexual Activity   Alcohol use: Yes    Alcohol/week: 0.0 standard drinks of alcohol    Comment: 1-2 weekly   Drug use: No   Sexual activity: Yes  Other Topics Concern   Not on file  Social History Narrative   Not on file   Social Determinants of Health   Financial Resource Strain: Low Risk  (06/03/2022)   Overall Financial Resource Strain (CARDIA)    Difficulty of Paying Living  Expenses: Not hard at all  Food Insecurity: No Food Insecurity (06/03/2022)   Hunger Vital Sign    Worried About Running Out of Food in the Last Year: Never true    Ran Out of Food in the Last Year: Never true  Transportation Needs: No Transportation Needs (06/03/2022)   PRAPARE - Hydrologist (Medical): No    Lack of Transportation (Non-Medical): No  Physical Activity: Sufficiently Active (06/03/2022)   Exercise Vital Sign    Days of Exercise per Week: 7 days    Minutes of Exercise per Session: 40 min  Stress: No Stress Concern Present (06/03/2022)   [place]    Feeling of Stress : Not at all  Social Connections: Unknown (06/03/2022)   Social Connection and Isolation Panel [NHANES]    Frequency of Communication with Friends and Family: More than three times a week    Frequency of Social Gatherings with Friends and Family: More than three times a week    Attends Religious Services: Never    Marine scientist or Organizations: No    Attends Archivist Meetings: Never    Marital Status: Not on file  Intimate Partner Violence: Not At Risk (06/03/2022)   Humiliation, Afraid, Rape, and Kick questionnaire    Fear of Current or Ex-Partner: No    Emotionally Abused: No    Physically Abused: No    Sexually Abused: No    FAMILY HISTORY: Family History  Problem Relation Age of Onset   Depression Mother    Breast cancer Mother 30       negative genetic testing   Depression Father    Hyperlipidemia Sister    Thyroid cancer Sister 64   Hyperlipidemia Brother    Lung cancer Maternal Aunt    Breast cancer Maternal Grandmother    Heart attack Maternal Grandmother    Stomach cancer Paternal Grandfather 25   Nephrolithiasis Daughter    Colon polyps Daughter    Breast cancer Cousin 68       maternal first cousin    ALLERGIES:  has No Known Allergies.  MEDICATIONS:  Current  Outpatient Medications  Medication Sig Dispense Refill   tamoxifen (NOLVADEX) 20 MG tablet Take 1 tablet (20 mg total) by mouth daily. 90 tablet 3   acetaminophen (TYLENOL) 500 MG tablet Take 500 mg by mouth every 6 (six) hours as needed for moderate pain.     albuterol (PROVENTIL HFA;VENTOLIN HFA) 108 (90 Base) MCG/ACT inhaler INHALE TWO PUFFS BY MOUTH EVERY 4 HOURS AS NEEDED FOR  WHEEZE 9 g 1   cetirizine (ZYRTEC) 10 MG tablet Take 1 tablet by mouth daily as needed (allergies).     Cholecalciferol (VITAMIN D) 50 MCG (2000 UT) CAPS Take 2,000 Units by mouth daily.     famotidine (PEPCID) 20 MG tablet TAKE 1 TABLET BY MOUTH AT BEDTIME 90 tablet  0   losartan (COZAAR) 100 MG tablet Take 1 tablet (100 mg total) by mouth daily. 90 tablet 3   Multiple Vitamin (MULTIVITAMIN) tablet Take 1 tablet by mouth daily.     Omega-3 Fatty Acids (FISH OIL PO) Take 1,400 mg by mouth daily.     oxyCODONE (ROXICODONE) 5 MG immediate release tablet Take 1 tablet (5 mg total) by mouth every 6 (six) hours as needed for severe pain. (Patient not taking: Reported on 04/27/2022) 10 tablet 0   sertraline (ZOLOFT) 50 MG tablet Take 1 tablet (50 mg total) by mouth daily. 90 tablet 3   simvastatin (ZOCOR) 40 MG tablet Take 1 tablet (40 mg total) by mouth daily. 90 tablet 3   zolpidem (AMBIEN) 10 MG tablet TAKE 1 TABLET BY MOUTH AT BEDTIME AS NEEDED FOR SLEEP 30 tablet 2   No current facility-administered medications for this visit.    REVIEW OF SYSTEMS:   Constitutional: Denies fevers, chills or abnormal night sweats Eyes: Denies blurriness of vision, double vision or watery eyes Ears, nose, mouth, throat, and face: Denies mucositis or sore throat Respiratory: Denies cough, dyspnea or wheezes Cardiovascular: Denies palpitation, chest discomfort or lower extremity swelling Gastrointestinal:  Denies nausea, heartburn or change in bowel habits Skin: Denies abnormal skin rashes Lymphatics: Denies new lymphadenopathy or  easy bruising Neurological:Denies numbness, tingling or new weaknesses Behavioral/Psych: Mood is stable, no new changes  Breast: Denies any palpable lumps or discharge All other systems were reviewed with the patient and are negative.  PHYSICAL EXAMINATION: ECOG PERFORMANCE STATUS: 0 - Asymptomatic  Vitals:   06/19/22 0945  BP: (!) 142/81  Pulse: 76  Resp: 14  Temp: 97.9 F (36.6 C)  SpO2: 100%   Filed Weights   06/19/22 0945  Weight: 146 lb 14.4 oz (66.6 kg)   General appearance: alert, oriented and in no acute distress Breast appears to be healing well from radiation  LABORATORY DATA:  I have reviewed the data as listed Lab Results  Component Value Date   WBC 6.3 04/14/2022   HGB 13.9 04/14/2022   HCT 39.8 04/14/2022   MCV 89.2 04/14/2022   PLT 276 04/14/2022   Lab Results  Component Value Date   NA 137 04/14/2022   K 3.9 04/14/2022   CL 106 04/14/2022   CO2 23 04/14/2022    RADIOGRAPHIC STUDIES: I have personally reviewed the radiological reports and agreed with the findings in the report.  ASSESSMENT AND PLAN:  Ductal carcinoma in situ (DCIS) of right breast This is a very pleasant 71 year old female patient with newly diagnosed DCIS, ER/PR positive status postlumpectomy, reexcision and adjuvant radiation referred to discuss adjuvant recommendations. I have once again discussed about mechanism of action of tamoxifen versus aromatase inhibitors, adverse effects with each class today in detail.  We have reviewed adverse effects of tamoxifen including but not limited to postmenopausal symptoms, DVT/PE, endometrial hyperplasia, endometrial cancer patient would like to proceed with tamoxifen.  She understands that tamoxifen interaction point that which reduces the effectiveness of tamoxifen.  She still feels comfortable with this drug.  She tells me that several of her family members and friends used it and they had good experience.  She understands that we can always  switch her to aromatase inhibitors if she is concerned about continuing tamoxifen.  She will return to clinic in 3 months with survivorship clinic  Total time spent: 30 minutes including history, physical exam, review of records, counseling and coordination of care All questions were  answered. The patient knows to call the clinic with any problems, questions or concerns.    Benay Pike, MD 06/19/22

## 2022-06-22 DIAGNOSIS — Z780 Asymptomatic menopausal state: Secondary | ICD-10-CM | POA: Diagnosis not present

## 2022-06-22 DIAGNOSIS — M8589 Other specified disorders of bone density and structure, multiple sites: Secondary | ICD-10-CM | POA: Diagnosis not present

## 2022-06-22 LAB — HM DEXA SCAN

## 2022-07-10 NOTE — Radiation Completion Notes (Signed)
Patient Name: Taylor Ewing, Taylor Ewing MRN: [id number] Date of Birth: 09-Aug-1950 Referring Physician: Loura Pardon, M.D. Date of Service: 2022-07-10 Radiation Oncologist: Eppie Gibson, M.D. [place]                             Radiation Oncology End of Treatment Note     Diagnosis: D05.11 Intraductal carcinoma in situ of right breast Intent: Curative     ==========DELIVERED PLANS==========  First Treatment Date: 2022-05-12 - Last Treatment Date: 2022-06-09   Plan Name: Breast_R Site: Breast, Right Technique: 3D Mode: Photon Dose Per Fraction: 2.67 Gy Prescribed Dose (Delivered / Prescribed): 40.05 Gy / 40.05 Gy Prescribed Fxs (Delivered / Prescribed): 15 / 15   Plan Name: Breast_R_Bst Site: Breast, Right Technique: 3D Mode: Photon Dose Per Fraction: 2 Gy Prescribed Dose (Delivered / Prescribed): 10 Gy / 10 Gy Prescribed Fxs (Delivered / Prescribed): 5 / 5     ==========ON TREATMENT VISIT DATES========== 2022-05-18, 2022-05-24, 2022-06-01, 2022-06-08     ==========UPCOMING VISITS==========       ==========APPENDIX - ON TREATMENT VISIT NOTES==========   PatEd 2022-05-11 Ongoing education performed.   ImpPlan 2022-05-11 The patient is tolerating radiation. Continue treatment as planned.   PhysExam 2022-05-11 Alert, no acute distress.   PatEd 2022-05-18 Ongoing education performed.   ImpPlan 2022-05-18 The patient is tolerating radiation. Continue treatment as planned.   PhysExam 2022-05-18 Alert, no acute distress.   RunningNotes 2022-05-18 05-18-22 education performed   ProgNote 2022-05-18 Changes from last week/visit? [ No ] Pain? [ No ] Fatigue? [ Yes, mild, comes and goes ] Skin irritation? [ Yes, rash under right breast, has had this before radiation ] Using lotions? [ No ] Lymphedema? [ No ] Issues with ROM? [ No ] Need refills: [ No ] Additional  Weekly Progress Notes [ rash is only concern for now ]     PatEd 2022-05-24 Ongoing education performed.   ImpPlan 2022-05-24 The patient is tolerating radiation. Continue treatment as planned.   PhysExam 2022-05-24 Alert, no acute distress.   ProgNote 2022-05-24 Changes from last week/visit? [ No ] Pain? [ Yes- mild discomfort to right breast. ] Fatigue? [ Yes ] Skin irritation? [ No ] Using lotions? [ Yes ] Lymphedema? [ No ] Issues with ROM? [ No ] Need refills: [ No ] Additional  Weekly Progress Notes [  ]    PatEd 2022-05-25 Ongoing education performed.   ImpPlan 2022-05-25 The patient is tolerating radiation. Continue treatment as planned.   PhysExam 2022-05-25 Alert, no acute distress.   PatEd 2022-06-01 Ongoing education performed.   ImpPlan 2022-06-01 The patient is tolerating radiation. Continue treatment as planned.   PhysExam 2022-06-01 Alert, no acute distress.   ProgNote 2022-06-01 Changes from last week/visit? [ Yes, a little more redness, rash at breast site ] Pain? [ No ] Fatigue? [ Yes, mild ] Skin irritation? [ Yes, more red this week ] Using lotions? [ Yes ] Lymphedema? [ No ] Issues with ROM? [ no ] Need refills: [  ] Additional  Weekly Progress Notes [ no major issues this week  ]    PatEd 2022-06-08 Ongoing education performed.   ImpPlan 2022-06-08 The patient is tolerating radiation. Continue treatment as planned.   PhysExam 2022-06-08 Alert, no acute distress.   ProgNote 2022-06-08 Changes from last week/visit? [  ] Pain? [ Reports mild tenderness to right breast. ] Fatigue? [ No ] Skin irritation? [ Yes- redness ]  Using lotions? [ Yes ] Lymphedema? [ No ] Issues with ROM? [ No ] Need refills: [ Yes-radiaplex ] Additional  Weekly Progress Notes [  ]

## 2022-07-16 ENCOUNTER — Encounter: Payer: Self-pay | Admitting: Radiation Oncology

## 2022-07-16 ENCOUNTER — Ambulatory Visit
Admission: RE | Admit: 2022-07-16 | Discharge: 2022-07-16 | Disposition: A | Discharge status: Home / Self Care | Payer: PPO | Source: Ambulatory Visit | Attending: Radiation Oncology | Admitting: Radiation Oncology

## 2022-07-16 ENCOUNTER — Telehealth: Payer: Self-pay

## 2022-07-16 NOTE — Telephone Encounter (Signed)
I called the patient today about her upcoming follow-up appointment in radiation oncology.   Given the state of the COVID-19 pandemic, concerning case numbers in our community, and guidance from [facility], I offered a phone assessment with the patient to determine if coming to the clinic was necessary. She accepted.  The patient denies any symptomatic concerns.  Specifically, they report good healing of their skin in the radiation fields.  Skin is intact.  The pt did report dry skin but other than that no concerns. She denied pain and reported normal energy. Pt was very grateful for the care received during her treatment.   I recommended that she continue skin care by applying oil or lotion with vitamin E to the skin in the radiation fields, BID, for 2 more months.  Continue follow-up with medical oncology - follow-up is scheduled on 09-21-22 with Dr. Chryl Heck.  I explained that yearly mammograms are important for patients with intact breast tissue, and physical exams are important after mastectomy for patients that cannot undergo mammography.  I encouraged her to call if she had further questions or concerns about her healing. Otherwise, she will follow-up PRN in radiation oncology. Patient is pleased with this plan, and we will cancel her upcoming follow-up to reduce the risk of COVID-19 transmission.

## 2022-07-27 ENCOUNTER — Telehealth: Payer: Self-pay | Admitting: Family Medicine

## 2022-07-27 NOTE — Telephone Encounter (Signed)
Patient called in and stated that she hasn't heard anything back in regards to her Bone density scan. She stated that they informed her she would hear from the office about results. Please advise. Thank you!

## 2022-07-27 NOTE — Telephone Encounter (Signed)
Pt notified of Dr. Marliss Coots comments off of her DEXA, copy sent to scanning and results abstracted

## 2022-08-03 ENCOUNTER — Other Ambulatory Visit: Payer: Self-pay | Admitting: Family Medicine

## 2022-08-04 NOTE — Telephone Encounter (Signed)
Spoke to pt, scheduled cpe for 08/25/22

## 2022-08-04 NOTE — Telephone Encounter (Signed)
Pt is due for her yearly CPE (labs prior if possible), please schedule and then route back to me to refill, thanks

## 2022-08-11 ENCOUNTER — Other Ambulatory Visit: Payer: Self-pay | Admitting: Family Medicine

## 2022-08-17 ENCOUNTER — Other Ambulatory Visit: Payer: Self-pay | Admitting: Family Medicine

## 2022-08-17 ENCOUNTER — Telehealth: Payer: Self-pay | Admitting: Family Medicine

## 2022-08-17 DIAGNOSIS — E7800 Pure hypercholesterolemia, unspecified: Secondary | ICD-10-CM

## 2022-08-17 DIAGNOSIS — I10 Essential (primary) hypertension: Secondary | ICD-10-CM

## 2022-08-17 DIAGNOSIS — R7303 Prediabetes: Secondary | ICD-10-CM

## 2022-08-17 NOTE — Telephone Encounter (Signed)
-----   Message from Velna Hatchet, RT sent at 08/05/2022 10:21 AM EST ----- Regarding: Tue 2/13 lab Patient is scheduled for cpx, please order future labs.  Thanks, Anda Kraft

## 2022-08-18 ENCOUNTER — Other Ambulatory Visit (INDEPENDENT_AMBULATORY_CARE_PROVIDER_SITE_OTHER): Payer: PPO

## 2022-08-18 DIAGNOSIS — I10 Essential (primary) hypertension: Secondary | ICD-10-CM

## 2022-08-18 DIAGNOSIS — E7800 Pure hypercholesterolemia, unspecified: Secondary | ICD-10-CM | POA: Diagnosis not present

## 2022-08-18 DIAGNOSIS — R7303 Prediabetes: Secondary | ICD-10-CM | POA: Diagnosis not present

## 2022-08-18 LAB — COMPREHENSIVE METABOLIC PANEL
ALT: 19 U/L (ref 0–35)
AST: 19 U/L (ref 0–37)
Albumin: 4.4 g/dL (ref 3.5–5.2)
Alkaline Phosphatase: 69 U/L (ref 39–117)
BUN: 16 mg/dL (ref 6–23)
CO2: 28 mEq/L (ref 19–32)
Calcium: 9.4 mg/dL (ref 8.4–10.5)
Chloride: 105 mEq/L (ref 96–112)
Creatinine, Ser: 0.82 mg/dL (ref 0.40–1.20)
GFR: 72.91 mL/min (ref >60.00)
Glucose, Bld: 94 mg/dL (ref 70–99)
Potassium: 4.2 mEq/L (ref 3.5–5.1)
Sodium: 141 mEq/L (ref 135–145)
Total Bilirubin: 0.4 mg/dL (ref 0.2–1.2)
Total Protein: 6.9 g/dL (ref 6.0–8.3)

## 2022-08-18 LAB — CBC WITH DIFFERENTIAL/PLATELET
Basophils Absolute: 0 10*3/uL (ref 0.0–0.1)
Basophils Relative: 0.5 % (ref 0.0–3.0)
Eosinophils Absolute: 0.1 10*3/uL (ref 0.0–0.7)
Eosinophils Relative: 1.9 % (ref 0.0–5.0)
HCT: 40 % (ref 36.0–46.0)
Hemoglobin: 13.7 g/dL (ref 12.0–15.0)
Lymphocytes Relative: 31.7 % (ref 12.0–46.0)
Lymphs Abs: 1.4 10*3/uL (ref 0.7–4.0)
MCHC: 34.2 g/dL (ref 30.0–36.0)
MCV: 91.6 fl (ref 78.0–100.0)
Monocytes Absolute: 0.5 10*3/uL (ref 0.1–1.0)
Monocytes Relative: 10.9 % (ref 3.0–12.0)
Neutro Abs: 2.5 10*3/uL (ref 1.4–7.7)
Neutrophils Relative %: 55 % (ref 43.0–77.0)
Platelets: 240 10*3/uL (ref 150.0–400.0)
RBC: 4.37 Mil/uL (ref 3.87–5.11)
RDW: 13.4 % (ref 11.5–15.5)
WBC: 4.5 10*3/uL (ref 4.0–10.5)

## 2022-08-18 LAB — TSH: TSH: 2.27 u[IU]/mL (ref 0.35–5.50)

## 2022-08-18 LAB — LDL CHOLESTEROL, DIRECT: Direct LDL: 105 mg/dL

## 2022-08-18 LAB — LIPID PANEL
Cholesterol: 187 mg/dL (ref 0–200)
HDL: 34.4 mg/dL — ABNORMAL LOW (ref >39.00)
NonHDL: 152.94
Total CHOL/HDL Ratio: 5
Triglycerides: 275 mg/dL — ABNORMAL HIGH (ref 0.0–149.0)
VLDL: 55 mg/dL — ABNORMAL HIGH (ref 0.0–40.0)

## 2022-08-18 LAB — HEMOGLOBIN A1C: Hgb A1c MFr Bld: 5.7 % (ref 4.6–6.5)

## 2022-08-19 NOTE — Telephone Encounter (Signed)
CPE is scheduled for 08/25/22, will route to PCP for review to make sure she wants pt to stay on this med since pt just had labs (lipid labs) yesterday

## 2022-08-25 ENCOUNTER — Encounter: Payer: Self-pay | Admitting: Family Medicine

## 2022-08-25 ENCOUNTER — Ambulatory Visit (INDEPENDENT_AMBULATORY_CARE_PROVIDER_SITE_OTHER): Payer: PPO | Admitting: Family Medicine

## 2022-08-25 VITALS — BP 155/82 | HR 85 | Temp 97.7°F | Ht 64.25 in | Wt 143.1 lb

## 2022-08-25 DIAGNOSIS — F419 Anxiety disorder, unspecified: Secondary | ICD-10-CM | POA: Diagnosis not present

## 2022-08-25 DIAGNOSIS — I10 Essential (primary) hypertension: Secondary | ICD-10-CM

## 2022-08-25 DIAGNOSIS — E7800 Pure hypercholesterolemia, unspecified: Secondary | ICD-10-CM | POA: Diagnosis not present

## 2022-08-25 DIAGNOSIS — D0511 Intraductal carcinoma in situ of right breast: Secondary | ICD-10-CM

## 2022-08-25 DIAGNOSIS — R7303 Prediabetes: Secondary | ICD-10-CM | POA: Diagnosis not present

## 2022-08-25 DIAGNOSIS — Z0000 Encounter for general adult medical examination without abnormal findings: Secondary | ICD-10-CM | POA: Diagnosis not present

## 2022-08-25 DIAGNOSIS — M85859 Other specified disorders of bone density and structure, unspecified thigh: Secondary | ICD-10-CM

## 2022-08-25 MED ORDER — SERTRALINE HCL 50 MG PO TABS: 50.0000 mg | ORAL_TABLET | Freq: Every day | ORAL | 3 refills | Status: DC

## 2022-08-25 MED ORDER — CHLORTHALIDONE 25 MG PO TABS: 25.0000 mg | ORAL_TABLET | Freq: Every day | ORAL | 0 refills | Status: DC

## 2022-08-25 MED ORDER — FAMOTIDINE 20 MG PO TABS: 20.0000 mg | ORAL_TABLET | Freq: Every day | ORAL | 3 refills | Status: DC

## 2022-08-25 MED ORDER — SIMVASTATIN 40 MG PO TABS: 40.0000 mg | ORAL_TABLET | Freq: Every day | ORAL | 3 refills | Status: DC

## 2022-08-25 MED ORDER — LOSARTAN POTASSIUM 100 MG PO TABS: 100.0000 mg | ORAL_TABLET | Freq: Every day | ORAL | 3 refills | Status: DC

## 2022-08-25 NOTE — Patient Instructions (Addendum)
Keep walking   Start some strength training - light weights or bands are great, 3 days per week It really pays off   Continue the fish oil and exercise to raise the HDL   Continue losartan Add chlorthalidone 25 mg daily  If any side effects - stop it and call   Follow up in 2-3 weeks for a re check and labs   Start check BP at home- when relaxed

## 2022-08-25 NOTE — Assessment & Plan Note (Signed)
Rev dexa 06/2022  No falls or fx  Good exercise   Disc need for calcium/ vitamin D/ wt bearing exercise and bone density test every 2 y to monitor Disc safety/ fracture risk in detail

## 2022-08-25 NOTE — Assessment & Plan Note (Signed)
Lab Results  Component Value Date   HGBA1C 5.7 08/18/2022   disc imp of low glycemic diet and wt loss to prevent DM2

## 2022-08-25 NOTE — Assessment & Plan Note (Signed)
Reviewed health habits including diet and exercise and skin cancer prevention Reviewed appropriate screening tests for age  Also reviewed health mt list, fam hx and immunization status , as well as social and family history   See HPI Labs reviewed  Utd mammo and breast cancer care  Colonoscopy utd 01/2014 Dexa utd 06/2022    no falls or fx and good exercise  PHQ reviewed /on medication

## 2022-08-25 NOTE — Assessment & Plan Note (Signed)
Disc goals for lipids and reasons to control them Rev last labs with pt Rev low sat fat diet in detail Change in HDL may be due to tamoxifen Enc her to continue fish oil and exercise   Simvastatin 40 mg daily

## 2022-08-25 NOTE — Assessment & Plan Note (Signed)
Some more irritability with tamoxifen Wants to continue current dose of sertraline - 50 mg daily good support  Ambien for sleep  Reviewed stressors/ coping techniques/symptoms/ support sources/ tx options and side effects in detail today

## 2022-08-25 NOTE — Assessment & Plan Note (Signed)
Now taking tamoxifen  Doing well overall  Mammogram due in aug  Continues onc f/u

## 2022-08-25 NOTE — Progress Notes (Signed)
Subjective:    Patient ID: Taylor Ewing, female    DOB: Nov 25, 1950, 72 y.o.   MRN: [id number]  HPI Here for health maintenance exam and to review chronic medical problems    Wt Readings from Last 3 Encounters:  08/25/22 143 lb 2 oz (64.9 kg)  06/19/22 146 lb 14.4 oz (66.6 kg)  06/03/22 143 lb (64.9 kg)   24.38 kg/m  Vitals:   08/25/22 1020  BP: (!) 160/84  Pulse: 85  Temp: 97.7 F (36.5 C)  SpO2: 96%   Had breast cancer this year  Adjusting to tamoxifen - moody, hot flashes and some leg pain  Some days are better than others   Immunization History  Administered Date(s) Administered   Fluad Quad(high Dose 65+) 06/06/2019, 04/24/2022   Influenza Split 06/15/2012   Influenza, High Dose Seasonal PF 07/12/2018   Influenza,inj,Quad PF,6+ Mos 08/13/2014, 09/03/2015   Influenza-Unspecified 05/15/2021   PFIZER(Purple Top)SARS-COV-2 Vaccination 07/27/2019, 08/17/2019, 04/19/2020, 12/20/2020   Pfizer Covid-19 Vaccine Bivalent Booster 42yr & up 04/24/2022   Pneumococcal Conjugate-13 06/06/2019   Pneumococcal Polysaccharide-23 11/13/2019   Respiratory Syncytial Virus Vaccine,Recomb Aduvanted(Arexvy) 04/24/2022   Td 07/30/2003   Tdap 08/13/2014   Zoster Recombinat (Shingrix) 06/06/2019, 10/24/2019  There are no preventive care reminders to display for this patient.  Mammogram 02/2022- dx with breast cancer  Personal h/o breast cancer, DCIS of R breast  Taking tamoxifen  Self breast exam: no changes now   Strong fam history  Had genetic testing and it was favorable   Not seeing gyn presently  No vaginal bleeding or pain   Colonoscopy 01/2014   Dexa  06/2022  osteopenia  Falls none  Fractures: none  Supplements taking vit D and eats ca rich foods  Exercise : tries to walk 5 or more days per week  2 miles or more   Mood: doing fair  Irritability from tamoxifen has been hard - but leveling out  Some ups and downs Not depressed   Anxiety  Zoloft 50 mg daily    Ambien for sleep       08/25/2022   10:24 AM 06/03/2022   10:09 AM 07/15/2021    8:54 AM 11/10/2019    9:04 AM 11/19/2017   12:29 PM  Depression screen PHQ 2/9  Decreased Interest 0 0 0 0 0  Down, Depressed, Hopeless 0 0 0 0 0  PHQ - 2 Score 0 0 0 0 0  Altered sleeping 1   0   Tired, decreased energy 0   0   Change in appetite 1   0   Feeling bad or failure about yourself  0   0   Trouble concentrating 0   0   Moving slowly or fidgety/restless 0   0   Suicidal thoughts 0   0   PHQ-9 Score 2   0   Difficult doing work/chores Not difficult at all   Not difficult at all       HTN bp is stable today  No cp or palpitations or headaches or edema  No side effects to medicines  BP Readings from Last 3 Encounters:  08/25/22 (!) 160/84  06/19/22 (!) 142/81  04/27/22 135/65    Losartan 100 mg daily    Lab Results  Component Value Date   CREATININE 0.82 08/18/2022   BUN 16 08/18/2022   NA 141 08/18/2022   K 4.2 08/18/2022   CL 105 08/18/2022   CO2 28 08/18/2022   Hyperlipidemia  Lab Results  Component Value Date   CHOL 187 08/18/2022   CHOL 167 07/15/2021   CHOL 162 11/10/2019   Lab Results  Component Value Date   HDL 34.40 (L) 08/18/2022   HDL 42.70 07/15/2021   HDL 41.30 11/10/2019   Lab Results  Component Value Date   LDLCALC 83 11/10/2019   LDLCALC 80 11/19/2017   LDLCALC 103 (H) 10/14/2016   Lab Results  Component Value Date   TRIG 275.0 (H) 08/18/2022   TRIG 210.0 (H) 07/15/2021   TRIG 191.0 (H) 11/10/2019   Lab Results  Component Value Date   CHOLHDL 5 08/18/2022   CHOLHDL 4 07/15/2021   CHOLHDL 4 11/10/2019   Lab Results  Component Value Date   LDLDIRECT 105.0 08/18/2022   LDLDIRECT 89.0 07/15/2021   LDLDIRECT 71.0 11/29/2018   Simvastatin 40 mg daily  Takes fish oil   Prediabetes Lab Results  Component Value Date   HGBA1C 5.7 08/18/2022   Does crave sweets with tamoxifen   Is fighting this and very mindful of it   Lab Results   Component Value Date   WBC 4.5 08/18/2022   HGB 13.7 08/18/2022   HCT 40.0 08/18/2022   MCV 91.6 08/18/2022   PLT 240.0 08/18/2022   Lab Results  Component Value Date   ALT 19 08/18/2022   AST 19 08/18/2022   ALKPHOS 69 08/18/2022   BILITOT 0.4 08/18/2022     Patient Active Problem List   Diagnosis Date Noted   Genetic testing 06/01/2022   Family history of breast cancer 05/19/2022   Ductal carcinoma in situ (DCIS) of right breast 03/20/2022   Abnormal ultrasound of endometrium 04/09/2020   Arthritis of right hip 03/27/2020   Pelvic pain 03/27/2020   Fatigue 03/22/2020   Osteopenia 02/20/2020   Estrogen deficiency 01/19/2020   Ganglion cyst 11/13/2019   Screening for HIV (human immunodeficiency virus) 09/03/2015   Prediabetes 08/13/2014   Stress reaction, emotional 08/13/2014   Routine general medical examination at a health care facility 06/15/2012   Encounter for routine gynecological examination 06/15/2012   Essential hypertension 02/10/2012   LOW BACK PAIN, CHRONIC 08/14/2008   Lower abdominal pain 08/14/2008   HYPERCHOLESTEROLEMIA 01/28/2007   Anxiety disorder 01/28/2007   ALLERGIC RHINITIS 01/28/2007   Asthma 01/28/2007   TMJ SYNDROME 01/28/2007   MIGRAINES, HX OF 01/28/2007   Past Medical History:  Diagnosis Date   Allergy    Anxiety    Asthma    Breast cancer ([place])    right breast   Cancer ([place])    skin, basal cell   Essential hypertension    History of kidney stones    Insomnia    Migraines    very rarely now, more prior to menopause   TMJ (dislocation of temporomandibular joint)    Past Surgical History:  Procedure Laterality Date   BREAST BIOPSY  2023   BREAST LUMPECTOMY WITH RADIOACTIVE SEED LOCALIZATION Right 04/01/2022   Procedure: RIGHT BREAST LUMPECTOMY WITH RADIOACTIVE SEED LOCALIZATION;  Surgeon: Jovita Kussmaul, MD;  Location: [facility];  Service: General;  Laterality: Right;   HAND SURGERY Left 2021   schwannoma  removal   HEMORRHOID SURGERY  11/04/2003   RE-EXCISION OF BREAST CANCER,SUPERIOR MARGINS Right 04/14/2022   Procedure: RE-EXCISION OF RIGHT BREAST ANTERIOR MARGIN;  Surgeon: Jovita Kussmaul, MD;  Location: [facility];  Service: General;  Laterality: Right;   SKIN LESION EXCISION     TUBAL LIGATION  Social History   Tobacco Use   Smoking status: Never   Smokeless tobacco: Never  Vaping Use   Vaping Use: Never used  Substance Use Topics   Alcohol use: Yes    Alcohol/week: 0.0 standard drinks of alcohol    Comment: 1-2 weekly   Drug use: No   Family History  Problem Relation Age of Onset   Depression Mother    Breast cancer Mother 9       negative genetic testing   Depression Father    Hyperlipidemia Sister    Thyroid cancer Sister 40   Hyperlipidemia Brother    Lung cancer Maternal Aunt    Breast cancer Maternal Grandmother    Heart attack Maternal Grandmother    Stomach cancer Paternal Grandfather 24   Nephrolithiasis Daughter    Colon polyps Daughter    Breast cancer Cousin 65       maternal first cousin   No Known Allergies Current Outpatient Medications on File Prior to Visit  Medication Sig Dispense Refill   acetaminophen (TYLENOL) 500 MG tablet Take 500 mg by mouth every 6 (six) hours as needed for moderate pain.     albuterol (PROVENTIL HFA;VENTOLIN HFA) 108 (90 Base) MCG/ACT inhaler INHALE TWO PUFFS BY MOUTH EVERY 4 HOURS AS NEEDED FOR  WHEEZE 9 g 1   cetirizine (ZYRTEC) 10 MG tablet Take 1 tablet by mouth daily as needed (allergies).     Cholecalciferol (VITAMIN D) 50 MCG (2000 UT) CAPS Take 2,000 Units by mouth daily.     Multiple Vitamin (MULTIVITAMIN) tablet Take 1 tablet by mouth daily.     Omega-3 Fatty Acids (FISH OIL PO) Take 1,400 mg by mouth daily.     tamoxifen (NOLVADEX) 20 MG tablet Take 1 tablet (20 mg total) by mouth daily. 90 tablet 3   zolpidem (AMBIEN) 10 MG tablet TAKE 1 TABLET BY MOUTH AT BEDTIME AS NEEDED FOR SLEEP 30 tablet 2   No current  facility-administered medications on file prior to visit.     Review of Systems  Constitutional:  Positive for fatigue. Negative for activity change, appetite change, fever and unexpected weight change.  HENT:  Negative for congestion, ear pain, rhinorrhea, sinus pressure and sore throat.   Eyes:  Negative for pain, redness and visual disturbance.  Respiratory:  Negative for cough, shortness of breath and wheezing.   Cardiovascular:  Negative for chest pain and palpitations.  Gastrointestinal:  Negative for abdominal pain, blood in stool, constipation and diarrhea.  Endocrine: Negative for polydipsia and polyuria.  Genitourinary:  Negative for dysuria, frequency and urgency.  Musculoskeletal:  Positive for arthralgias. Negative for back pain and myalgias.  Skin:  Negative for pallor and rash.  Allergic/Immunologic: Negative for environmental allergies.  Neurological:  Negative for dizziness, syncope and headaches.  Hematological:  Negative for adenopathy. Does not bruise/bleed easily.  Psychiatric/Behavioral:  Positive for sleep disturbance. Negative for decreased concentration, dysphoric mood and suicidal ideas. The patient is nervous/anxious.        Objective:   Physical Exam Constitutional:      General: She is not in acute distress.    Appearance: Normal appearance. She is well-developed and normal weight. She is not ill-appearing or diaphoretic.  HENT:     Head: Normocephalic and atraumatic.     Right Ear: Tympanic membrane, ear canal and external ear normal.     Left Ear: Tympanic membrane, ear canal and external ear normal.     Nose: Nose normal. No congestion.  Mouth/Throat:     Mouth: Mucous membranes are moist.     Pharynx: Oropharynx is clear. No posterior oropharyngeal erythema.  Eyes:     General: No scleral icterus.    Extraocular Movements: Extraocular movements intact.     Conjunctiva/sclera: Conjunctivae normal.     Pupils: Pupils are equal, round, and  reactive to light.  Neck:     Thyroid: No thyromegaly.     Vascular: No carotid bruit or JVD.  Cardiovascular:     Rate and Rhythm: Normal rate and regular rhythm.     Pulses: Normal pulses.     Heart sounds: Normal heart sounds.     No gallop.  Pulmonary:     Effort: Pulmonary effort is normal. No respiratory distress.     Breath sounds: Normal breath sounds. No wheezing.     Comments: Good air exch Chest:     Chest wall: No tenderness.  Abdominal:     General: Bowel sounds are normal. There is no distension or abdominal bruit.     Palpations: Abdomen is soft. There is no mass.     Tenderness: There is no abdominal tenderness.     Hernia: No hernia is present.  Genitourinary:    Comments: Breast exam: No mass, nodules, thickening, tenderness, bulging, retraction, inflamation, nipple discharge or skin changes noted.  No axillary or clavicular LA.     Post tx changes noted in r breast  Musculoskeletal:        General: No tenderness. Normal range of motion.     Cervical back: Normal range of motion and neck supple. No rigidity. No muscular tenderness.     Right lower leg: No edema.     Left lower leg: No edema.     Comments: No kyphosis   Lymphadenopathy:     Cervical: No cervical adenopathy.  Skin:    General: Skin is warm and dry.     Coloration: Skin is not pale.     Findings: No erythema or rash.     Comments: Solar lentigines diffusely   Neurological:     Mental Status: She is alert. Mental status is at baseline.     Cranial Nerves: No cranial nerve deficit.     Motor: No abnormal muscle tone.     Coordination: Coordination normal.     Gait: Gait normal.     Deep Tendon Reflexes: Reflexes are normal and symmetric. Reflexes normal.  Psychiatric:        Mood and Affect: Mood normal.        Cognition and Memory: Cognition and memory normal.           Assessment & Plan:   Problem List Items Addressed This Visit       Cardiovascular and Mediastinum    Essential hypertension    BP: (!) 155/82   Losatran 100 mg  Has been trending up   Will add chlorthalidone 25 mg daily  Disc poss side eff/will update  Handout given  F/u approx 2 wk for visit and lab      Relevant Medications   chlorthalidone (HYGROTON) 25 MG tablet   losartan (COZAAR) 100 MG tablet   simvastatin (ZOCOR) 40 MG tablet     Musculoskeletal and Integument   Osteopenia    Rev dexa 06/2022  No falls or fx  Good exercise   Disc need for calcium/ vitamin D/ wt bearing exercise and bone density test every 2 y to monitor Disc safety/ fracture risk in detail  Other   Anxiety disorder    Some more irritability with tamoxifen Wants to continue current dose of sertraline - 50 mg daily good support  Ambien for sleep  Reviewed stressors/ coping techniques/symptoms/ support sources/ tx options and side effects in detail today       Relevant Medications   sertraline (ZOLOFT) 50 MG tablet   Ductal carcinoma in situ (DCIS) of right breast    Now taking tamoxifen  Doing well overall  Mammogram due in aug  Continues onc f/u      HYPERCHOLESTEROLEMIA    Disc goals for lipids and reasons to control them Rev last labs with pt Rev low sat fat diet in detail Change in HDL may be due to tamoxifen Enc her to continue fish oil and exercise   Simvastatin 40 mg daily        Relevant Medications   chlorthalidone (HYGROTON) 25 MG tablet   losartan (COZAAR) 100 MG tablet   simvastatin (ZOCOR) 40 MG tablet   Prediabetes    Lab Results  Component Value Date   HGBA1C 5.7 08/18/2022  disc imp of low glycemic diet and wt loss to prevent DM2       Routine general medical examination at a health care facility - Primary    Reviewed health habits including diet and exercise and skin cancer prevention Reviewed appropriate screening tests for age  Also reviewed health mt list, fam hx and immunization status , as well as social and family history   See HPI Labs  reviewed  Utd mammo and breast cancer care  Colonoscopy utd 01/2014 Dexa utd 06/2022    no falls or fx and good exercise  PHQ reviewed /on medication

## 2022-08-25 NOTE — Assessment & Plan Note (Signed)
BP: (!) 155/82   Losatran 100 mg  Has been trending up   Will add chlorthalidone 25 mg daily  Disc poss side eff/will update  Handout given  F/u approx 2 wk for visit and lab

## 2022-09-01 ENCOUNTER — Telehealth: Payer: Self-pay | Admitting: Hematology and Oncology

## 2022-09-01 NOTE — Telephone Encounter (Signed)
Contacted patient to scheduled appointments. Left message with appointment details and a call back number if patient had any questions or could not accommodate the time we provided.   

## 2022-09-08 ENCOUNTER — Ambulatory Visit (INDEPENDENT_AMBULATORY_CARE_PROVIDER_SITE_OTHER): Payer: PPO | Admitting: Family Medicine

## 2022-09-08 ENCOUNTER — Encounter: Payer: Self-pay | Admitting: Family Medicine

## 2022-09-08 VITALS — BP 108/72 | HR 77 | Temp 97.6°F | Ht 64.25 in | Wt 143.0 lb

## 2022-09-08 DIAGNOSIS — J069 Acute upper respiratory infection, unspecified: Secondary | ICD-10-CM | POA: Insufficient documentation

## 2022-09-08 DIAGNOSIS — I10 Essential (primary) hypertension: Secondary | ICD-10-CM

## 2022-09-08 DIAGNOSIS — C50311 Malignant neoplasm of lower-inner quadrant of right female breast: Secondary | ICD-10-CM | POA: Insufficient documentation

## 2022-09-08 DIAGNOSIS — Z170 Estrogen receptor positive status [ER+]: Secondary | ICD-10-CM | POA: Diagnosis not present

## 2022-09-08 LAB — BASIC METABOLIC PANEL
BUN: 18 mg/dL (ref 6–23)
CO2: 28 mEq/L (ref 19–32)
Calcium: 9.6 mg/dL (ref 8.4–10.5)
Chloride: 100 mEq/L (ref 96–112)
Creatinine, Ser: 0.85 mg/dL (ref 0.40–1.20)
GFR: 69.81 mL/min (ref >60.00)
Glucose, Bld: 96 mg/dL (ref 70–99)
Potassium: 3.7 mEq/L (ref 3.5–5.1)
Sodium: 138 mEq/L (ref 135–145)

## 2022-09-08 NOTE — Progress Notes (Signed)
Subjective:    Patient ID: Taylor Ewing, female    DOB: 08/19/1950, 72 y.o.   MRN: [id number]  HPI Pt presents for f/u of HTN   Also has a cold   Wt Readings from Last 3 Encounters:  09/08/22 143 lb (64.9 kg)  08/25/22 143 lb 2 oz (64.9 kg)  06/19/22 146 lb 14.4 oz (66.6 kg)   24.36 kg/m  Vitals:   09/08/22 1022  BP: 108/72  Pulse: 77  Temp: 97.6 F (36.4 C)  SpO2: 97%     Noted last visit that bp was trending up  Was prev on losartan 100 mg daily alone  We added chlorthalidone 25 mg daily   BP Readings from Last 3 Encounters:  09/08/22 108/72  08/25/22 (!) 155/82  06/19/22 (!) 142/81   Pulse Readings from Last 3 Encounters:  09/08/22 77  08/25/22 85  06/19/22 76   Bp was 128/67 at home this am   Did notice increased urination at first Works best to take at 10 am   Not dizzy  Felt off due to a head cold   Did not do a covid test Over a week (stayed in)  No fever  Feels better today   Nasal congestion  Ear pain  Getting better  Not much cough   Taking care of herself   Due for labs   Lab Results  Component Value Date   CREATININE 0.82 08/18/2022   BUN 16 08/18/2022   NA 141 08/18/2022   K 4.2 08/18/2022   CL 105 08/18/2022   CO2 28 08/18/2022     Takes simvastatin for cholesterol 40 mg daily Lab Results  Component Value Date   CHOL 187 08/18/2022   HDL 34.40 (L) 08/18/2022   LDLCALC 83 11/10/2019   LDLDIRECT 105.0 08/18/2022   TRIG 275.0 (H) 08/18/2022   CHOLHDL 5 08/18/2022    Patient Active Problem List   Diagnosis Date Noted   Viral URI 09/08/2022   Malignant neoplasm of lower-inner quadrant of right breast of female, estrogen receptor positive ([place]) 09/08/2022   Genetic testing 06/01/2022   Family history of breast cancer 05/19/2022   Ductal carcinoma in situ (DCIS) of right breast 03/20/2022   Abnormal ultrasound of endometrium 04/09/2020   Arthritis of right hip 03/27/2020   Pelvic pain 03/27/2020   Fatigue  03/22/2020   Osteopenia 02/20/2020   Estrogen deficiency 01/19/2020   Ganglion cyst 11/13/2019   Screening for HIV (human immunodeficiency virus) 09/03/2015   Prediabetes 08/13/2014   Stress reaction, emotional 08/13/2014   Routine general medical examination at a health care facility 06/15/2012   Encounter for routine gynecological examination 06/15/2012   Essential hypertension 02/10/2012   LOW BACK PAIN, CHRONIC 08/14/2008   Lower abdominal pain 08/14/2008   HYPERCHOLESTEROLEMIA 01/28/2007   Anxiety disorder 01/28/2007   ALLERGIC RHINITIS 01/28/2007   Asthma 01/28/2007   TMJ SYNDROME 01/28/2007   MIGRAINES, HX OF 01/28/2007   Past Medical History:  Diagnosis Date   Allergy    Anxiety    Asthma    Breast cancer ([place])    right breast   Cancer ([place])    skin, basal cell   Essential hypertension    History of kidney stones    Insomnia    Migraines    very rarely now, more prior to menopause   TMJ (dislocation of temporomandibular joint)    Past Surgical History:  Procedure Laterality Date   BREAST BIOPSY  2023   BREAST  LUMPECTOMY WITH RADIOACTIVE SEED LOCALIZATION Right 04/01/2022   Procedure: RIGHT BREAST LUMPECTOMY WITH RADIOACTIVE SEED LOCALIZATION;  Surgeon: Jovita Kussmaul, MD;  Location: [facility];  Service: General;  Laterality: Right;   HAND SURGERY Left 2021   schwannoma removal   HEMORRHOID SURGERY  11/04/2003   RE-EXCISION OF BREAST CANCER,SUPERIOR MARGINS Right 04/14/2022   Procedure: RE-EXCISION OF RIGHT BREAST ANTERIOR MARGIN;  Surgeon: Jovita Kussmaul, MD;  Location: [facility];  Service: General;  Laterality: Right;   SKIN LESION EXCISION     TUBAL LIGATION     Social History   Tobacco Use   Smoking status: Never   Smokeless tobacco: Never  Vaping Use   Vaping Use: Never used  Substance Use Topics   Alcohol use: Yes    Alcohol/week: 0.0 standard drinks of alcohol    Comment: 1-2 weekly   Drug use: No   Family History  Problem  Relation Age of Onset   Depression Mother    Breast cancer Mother 76       negative genetic testing   Depression Father    Hyperlipidemia Sister    Thyroid cancer Sister 42   Hyperlipidemia Brother    Lung cancer Maternal Aunt    Breast cancer Maternal Grandmother    Heart attack Maternal Grandmother    Stomach cancer Paternal Grandfather 78   Nephrolithiasis Daughter    Colon polyps Daughter    Breast cancer Cousin 39       maternal first cousin   No Known Allergies Current Outpatient Medications on File Prior to Visit  Medication Sig Dispense Refill   acetaminophen (TYLENOL) 500 MG tablet Take 500 mg by mouth every 6 (six) hours as needed for moderate pain.     albuterol (PROVENTIL HFA;VENTOLIN HFA) 108 (90 Base) MCG/ACT inhaler INHALE TWO PUFFS BY MOUTH EVERY 4 HOURS AS NEEDED FOR  WHEEZE 9 g 1   cetirizine (ZYRTEC) 10 MG tablet Take 1 tablet by mouth daily as needed (allergies).     chlorthalidone (HYGROTON) 25 MG tablet Take 1 tablet (25 mg total) by mouth daily. In the am 30 tablet 0   Cholecalciferol (VITAMIN D) 50 MCG (2000 UT) CAPS Take 2,000 Units by mouth daily.     famotidine (PEPCID) 20 MG tablet Take 1 tablet (20 mg total) by mouth at bedtime. 90 tablet 3   losartan (COZAAR) 100 MG tablet Take 1 tablet (100 mg total) by mouth daily. 90 tablet 3   Multiple Vitamin (MULTIVITAMIN) tablet Take 1 tablet by mouth daily.     Omega-3 Fatty Acids (FISH OIL PO) Take 1,400 mg by mouth daily.     sertraline (ZOLOFT) 50 MG tablet Take 1 tablet (50 mg total) by mouth daily. 90 tablet 3   simvastatin (ZOCOR) 40 MG tablet Take 1 tablet (40 mg total) by mouth daily. 90 tablet 3   tamoxifen (NOLVADEX) 20 MG tablet Take 1 tablet (20 mg total) by mouth daily. 90 tablet 3   zolpidem (AMBIEN) 10 MG tablet TAKE 1 TABLET BY MOUTH AT BEDTIME AS NEEDED FOR SLEEP 30 tablet 2   No current facility-administered medications on file prior to visit.     Review of Systems  Constitutional:   Negative for activity change, appetite change, fatigue, fever and unexpected weight change.  HENT:  Positive for congestion and postnasal drip. Negative for ear pain, rhinorrhea, sinus pressure, sinus pain, sneezing, sore throat and trouble swallowing.        Had ear  pain and pressure  It is improved now  Eyes:  Negative for pain, discharge, redness and visual disturbance.  Respiratory:  Negative for cough, shortness of breath, wheezing and stridor.   Cardiovascular:  Negative for chest pain and palpitations.  Gastrointestinal:  Negative for abdominal pain, blood in stool, constipation, diarrhea, nausea and vomiting.  Endocrine: Negative for polydipsia and polyuria.  Genitourinary:  Negative for dysuria, frequency, hematuria and urgency.  Musculoskeletal:  Negative for arthralgias, back pain and myalgias.  Skin:  Negative for pallor and rash.  Allergic/Immunologic: Negative for environmental allergies.  Neurological:  Negative for dizziness, syncope, weakness, light-headedness and headaches.  Hematological:  Negative for adenopathy. Does not bruise/bleed easily.  Psychiatric/Behavioral:  Negative for confusion, decreased concentration and dysphoric mood. The patient is not nervous/anxious.        Objective:   Physical Exam Constitutional:      General: She is not in acute distress.    Appearance: Normal appearance. She is well-developed and normal weight. She is not ill-appearing or diaphoretic.  HENT:     Head: Normocephalic and atraumatic.     Right Ear: Tympanic membrane and ear canal normal.     Left Ear: Tympanic membrane and ear canal normal.     Nose:     Comments: Nares are boggy and mildly congested  Eyes:     General:        Right eye: No discharge.        Left eye: No discharge.     Conjunctiva/sclera: Conjunctivae normal.     Pupils: Pupils are equal, round, and reactive to light.  Neck:     Thyroid: No thyromegaly.     Vascular: No carotid bruit or JVD.   Cardiovascular:     Rate and Rhythm: Normal rate and regular rhythm.     Heart sounds: Normal heart sounds.     No gallop.  Pulmonary:     Effort: Pulmonary effort is normal. No respiratory distress.     Breath sounds: Normal breath sounds. No stridor. No wheezing, rhonchi or rales.  Abdominal:     General: There is no distension or abdominal bruit.     Palpations: Abdomen is soft.  Musculoskeletal:     Cervical back: Normal range of motion and neck supple.     Right lower leg: No edema.     Left lower leg: No edema.  Lymphadenopathy:     Cervical: No cervical adenopathy.  Skin:    General: Skin is warm and dry.     Coloration: Skin is not pale.     Findings: No rash.  Neurological:     Mental Status: She is alert.     Coordination: Coordination normal.     Deep Tendon Reflexes: Reflexes are normal and symmetric. Reflexes normal.  Psychiatric:        Mood and Affect: Mood normal.           Assessment & Plan:   Problem List Items Addressed This Visit       Cardiovascular and Mediastinum   Essential hypertension - Primary    BP: 108/72  Much improved with addn of chlorthalidone 25 mg daily  Continues losartan 100 mg daily   Tolerating well  No report of orthostatic dizziness  Lab today for bmet / watching K with this med combination   Pending labs will likely continue current medicines  Enc self care with exercise       Relevant Orders   Basic metabolic panel  Respiratory   Viral URI    Fairly mild  Over a week Nasal congestion  Taking generic day quil/nyquil   Update if not starting to improve in a week or if worsening   Will continue to treat symptoms         Other   Malignant neoplasm of lower-inner quadrant of right breast of female, estrogen receptor positive ([place])    Continues tamoxifen Has f/u with onc later this mo Doing well

## 2022-09-08 NOTE — Assessment & Plan Note (Signed)
Continues tamoxifen Has f/u with onc later this mo Doing well

## 2022-09-08 NOTE — Assessment & Plan Note (Signed)
BP: 108/72  Much improved with addn of chlorthalidone 25 mg daily  Continues losartan 100 mg daily   Tolerating well  No report of orthostatic dizziness  Lab today for bmet / watching K with this med combination   Pending labs will likely continue current medicines  Enc self care with exercise

## 2022-09-08 NOTE — Patient Instructions (Signed)
Glad your blood pressure looks good  Take care of yourself   Lab today    Treat cold symptoms as needed  Update if not starting to improve in a week or if worsening

## 2022-09-08 NOTE — Assessment & Plan Note (Signed)
Fairly mild  Over a week Nasal congestion  Taking generic day quil/nyquil   Update if not starting to improve in a week or if worsening   Will continue to treat symptoms

## 2022-09-12 ENCOUNTER — Ambulatory Visit
Admission: EM | Admit: 2022-09-12 | Discharge: 2022-09-12 | Disposition: A | Discharge status: Home / Self Care | Payer: PPO | Attending: Nurse Practitioner | Admitting: Nurse Practitioner

## 2022-09-12 DIAGNOSIS — J0100 Acute maxillary sinusitis, unspecified: Secondary | ICD-10-CM | POA: Diagnosis not present

## 2022-09-12 MED ORDER — AMOXICILLIN-POT CLAVULANATE 875-125 MG PO TABS: 1.0000 | ORAL_TABLET | Freq: Two times a day (BID) | ORAL | 0 refills | Status: AC

## 2022-09-12 MED ORDER — FLUTICASONE PROPIONATE 50 MCG/ACT NA SUSP: 1.0000 | Freq: Every day | NASAL | 0 refills | Status: DC

## 2022-09-12 NOTE — ED Triage Notes (Signed)
Pt presents with c.o facial pain. States she had a cold for 2 weeks and states yesterday morning she woke up with her nose tender to the touch, redness and swelling on her nose. States her jaw bones hurt. Reports chills and denies fever.

## 2022-09-12 NOTE — ED Provider Notes (Signed)
[facility]    CSN: [id number] Arrival date & time: 09/12/22  1055      History   Chief Complaint Chief Complaint  Patient presents with   Facial Pain    red swollen nose - Entered by patient    HPI Taylor Ewing is a 72 y.o. female  presents for evaluation of URI symptoms for 2 weeks. Patient reports associated symptoms of sinus pressure/pain with chills and nose pain. Denies N/V/D, fevers, cough, body aches, shortness of. Patient does have a hx of asthma.  States she has not needed to use an inhaler in years. no smoking. No known sick contacts.  Pt has taken tylenol OTC for symptoms. Pt has no other concerns at this time.   HPI  Past Medical History:  Diagnosis Date   Allergy    Anxiety    Asthma    Breast cancer ([place])    right breast   Cancer ([place])    skin, basal cell   Essential hypertension    History of kidney stones    Insomnia    Migraines    very rarely now, more prior to menopause   TMJ (dislocation of temporomandibular joint)     Patient Active Problem List   Diagnosis Date Noted   Viral URI 09/08/2022   Malignant neoplasm of lower-inner quadrant of right breast of female, estrogen receptor positive ([place]) 09/08/2022   Genetic testing 06/01/2022   Family history of breast cancer 05/19/2022   Ductal carcinoma in situ (DCIS) of right breast 03/20/2022   Abnormal ultrasound of endometrium 04/09/2020   Arthritis of right hip 03/27/2020   Pelvic pain 03/27/2020   Fatigue 03/22/2020   Osteopenia 02/20/2020   Estrogen deficiency 01/19/2020   Ganglion cyst 11/13/2019   Screening for HIV (human immunodeficiency virus) 09/03/2015   Prediabetes 08/13/2014   Stress reaction, emotional 08/13/2014   Routine general medical examination at a health care facility 06/15/2012   Encounter for routine gynecological examination 06/15/2012   Essential hypertension 02/10/2012   LOW BACK PAIN, CHRONIC 08/14/2008   Lower abdominal pain 08/14/2008    HYPERCHOLESTEROLEMIA 01/28/2007   Anxiety disorder 01/28/2007   ALLERGIC RHINITIS 01/28/2007   Asthma 01/28/2007   TMJ SYNDROME 01/28/2007   MIGRAINES, HX OF 01/28/2007    Past Surgical History:  Procedure Laterality Date   BREAST BIOPSY  2023   BREAST LUMPECTOMY WITH RADIOACTIVE SEED LOCALIZATION Right 04/01/2022   Procedure: RIGHT BREAST LUMPECTOMY WITH RADIOACTIVE SEED LOCALIZATION;  Surgeon: Jovita Kussmaul, MD;  Location: [facility];  Service: General;  Laterality: Right;   HAND SURGERY Left 2021   schwannoma removal   HEMORRHOID SURGERY  11/04/2003   RE-EXCISION OF BREAST CANCER,SUPERIOR MARGINS Right 04/14/2022   Procedure: RE-EXCISION OF RIGHT BREAST ANTERIOR MARGIN;  Surgeon: Jovita Kussmaul, MD;  Location[facility];  Service: General;  Laterality: Right;   SKIN LESION EXCISION     TUBAL LIGATION      OB History   No obstetric history on file.      Home Medications    Prior to Admission medications   Medication Sig Start Date End Date Taking? Authorizing Provider  amoxicillin-clavulanate (AUGMENTIN) 875-125 MG tablet Take 1 tablet by mouth every 12 (twelve) hours for 10 days. 09/12/22 09/22/22 Yes Melynda Ripple, NP  fluticasone (FLONASE) 50 MCG/ACT nasal spray Place 1 spray into both nostrils daily. 09/12/22  Yes Melynda Ripple, NP  acetaminophen (TYLENOL) 500 MG tablet Take 500 mg by mouth  every 6 (six) hours as needed for moderate pain.    [provider]  albuterol (PROVENTIL HFA;VENTOLIN HFA) 108 (90 Base) MCG/ACT inhaler INHALE TWO PUFFS BY MOUTH EVERY 4 HOURS AS NEEDED FOR  WHEEZE 09/27/18   Tower, Wynelle Fanny, MD  cetirizine (ZYRTEC) 10 MG tablet Take 1 tablet by mouth daily as needed (allergies).    [provider]  chlorthalidone (HYGROTON) 25 MG tablet Take 1 tablet (25 mg total) by mouth daily. In the am 08/25/22   Tower, Wynelle Fanny, MD  Cholecalciferol (VITAMIN D) 50 MCG (2000 UT) CAPS Take 2,000 Units by mouth daily.    [provider]   famotidine (PEPCID) 20 MG tablet Take 1 tablet (20 mg total) by mouth at bedtime. 08/25/22   Tower, Wynelle Fanny, MD  losartan (COZAAR) 100 MG tablet Take 1 tablet (100 mg total) by mouth daily. 08/25/22   Tower, Wynelle Fanny, MD  Multiple Vitamin (MULTIVITAMIN) tablet Take 1 tablet by mouth daily.    [provider]  Omega-3 Fatty Acids (FISH OIL PO) Take 1,400 mg by mouth daily.    [provider]  sertraline (ZOLOFT) 50 MG tablet Take 1 tablet (50 mg total) by mouth daily. 08/25/22   Tower, Wynelle Fanny, MD  simvastatin (ZOCOR) 40 MG tablet Take 1 tablet (40 mg total) by mouth daily. 08/25/22   Tower, Wynelle Fanny, MD  tamoxifen (NOLVADEX) 20 MG tablet Take 1 tablet (20 mg total) by mouth daily. 06/19/22   Benay Pike, MD  zolpidem (AMBIEN) 10 MG tablet TAKE 1 TABLET BY MOUTH AT BEDTIME AS NEEDED FOR SLEEP 04/06/22   Tower, Wynelle Fanny, MD    Family History Family History  Problem Relation Age of Onset   Depression Mother    Breast cancer Mother 63       negative genetic testing   Depression Father    Hyperlipidemia Sister    Thyroid cancer Sister 30   Hyperlipidemia Brother    Lung cancer Maternal Aunt    Breast cancer Maternal Grandmother    Heart attack Maternal Grandmother    Stomach cancer Paternal Grandfather 24   Nephrolithiasis Daughter    Colon polyps Daughter    Breast cancer Cousin 57       maternal first cousin    Social History Social History   Tobacco Use   Smoking status: Never   Smokeless tobacco: Never  Vaping Use   Vaping Use: Never used  Substance Use Topics   Alcohol use: Yes    Alcohol/week: 0.0 standard drinks of alcohol    Comment: 1-2 weekly   Drug use: No     Allergies   Patient has no known allergies.   Review of Systems Review of Systems  Constitutional:  Positive for chills.  HENT:  Positive for congestion, sinus pressure and sinus pain.      Physical Exam Triage Vital Signs ED Triage Vitals [09/12/22 1103]  Enc Vitals Group      BP 106/70     Pulse Rate 97     Resp 20     Temp 98.3 F (36.8 C)     Temp Source Oral     SpO2 98 %     Weight      Height      Head Circumference      Peak Flow      Pain Score 0     Pain Loc      Pain Edu?  Excl. in [place]?    No data found.  Updated Vital Signs BP 106/70 (BP Location: Left Arm)   Pulse 97   Temp 98.3 F (36.8 C) (Oral)   Resp 20   SpO2 98%   Visual Acuity Right Eye Distance:   Left Eye Distance:   Bilateral Distance:    Right Eye Near:   Left Eye Near:    Bilateral Near:     Physical Exam Vitals and nursing note reviewed.  Constitutional:      General: She is not in acute distress.    Appearance: She is well-developed. She is not ill-appearing.  HENT:     Head: Normocephalic and atraumatic.     Right Ear: Tympanic membrane and ear canal normal.     Left Ear: Tympanic membrane and ear canal normal.     Nose: Congestion present.     Right Turbinates: Swollen and pale.     Left Turbinates: Swollen and pale.     Right Sinus: Maxillary sinus tenderness present. No frontal sinus tenderness.     Left Sinus: Maxillary sinus tenderness present. No frontal sinus tenderness.     Comments: Mandibular adenopathy noted    Mouth/Throat:     Mouth: Mucous membranes are moist.     Pharynx: Oropharynx is clear. Uvula midline. No oropharyngeal exudate or posterior oropharyngeal erythema.     Tonsils: No tonsillar exudate or tonsillar abscesses.  Eyes:     Conjunctiva/sclera: Conjunctivae normal.     Pupils: Pupils are equal, round, and reactive to light.  Cardiovascular:     Rate and Rhythm: Normal rate and regular rhythm.     Heart sounds: Normal heart sounds.  Pulmonary:     Effort: Pulmonary effort is normal.     Breath sounds: Normal breath sounds.  Musculoskeletal:     Cervical back: Normal range of motion and neck supple.  Lymphadenopathy:     Cervical: No cervical adenopathy.  Skin:    General: Skin is warm and dry.  Neurological:      General: No focal deficit present.     Mental Status: She is alert and oriented to person, place, and time.  Psychiatric:        Mood and Affect: Mood normal.        Behavior: Behavior normal.      UC Treatments / Results  Labs (all labs ordered are listed, but only abnormal results are displayed) Labs Reviewed - No data to display  EKG   Radiology No results found.  Procedures Procedures (including critical care time)  Medications Ordered in UC Medications - No data to display  Initial Impression / Assessment and Plan / UC Course  I have reviewed the triage vital signs and the nursing notes.  Pertinent labs & imaging results that were available during my care of the patient were reviewed by me and considered in my medical decision making (see chart for details).     Start Augmentin twice daily for 10 days Nasal rinses as tolerated Flonase daily Continue OTC analgesics as needed PCP follow-up if symptoms do not improve ER precautions reviewed and patient verbalized understanding Final Clinical Impressions(s) / UC Diagnoses   Final diagnoses:  Acute maxillary sinusitis, recurrence not specified     Discharge Instructions      Augmentin twice daily for 10 days Flonase daily Nasal rinses as tolerated Follow-up with your PCP if symptoms do not improve Please go to the ER for any worsening symptoms   ED  Prescriptions     Medication Sig Dispense Auth. Provider   amoxicillin-clavulanate (AUGMENTIN) 875-125 MG tablet Take 1 tablet by mouth every 12 (twelve) hours for 10 days. 20 tablet Melynda Ripple, NP   fluticasone (FLONASE) 50 MCG/ACT nasal spray Place 1 spray into both nostrils daily. 15.8 mL Melynda Ripple, NP      PDMP not reviewed this encounter.   Melynda Ripple, NP 09/12/22 1122

## 2022-09-12 NOTE — Discharge Instructions (Signed)
Augmentin twice daily for 10 days Flonase daily Nasal rinses as tolerated Follow-up with your PCP if symptoms do not improve Please go to the ER for any worsening symptoms

## 2022-09-21 ENCOUNTER — Inpatient Hospital Stay: Payer: PPO | Attending: Hematology and Oncology | Admitting: Adult Health

## 2022-09-21 ENCOUNTER — Encounter: Payer: Self-pay | Admitting: Adult Health

## 2022-09-21 VITALS — BP 133/58 | HR 83 | Temp 97.9°F | Resp 14 | Ht 64.5 in | Wt 143.6 lb

## 2022-09-21 DIAGNOSIS — Z800 Family history of malignant neoplasm of digestive organs: Secondary | ICD-10-CM | POA: Diagnosis not present

## 2022-09-21 DIAGNOSIS — Z803 Family history of malignant neoplasm of breast: Secondary | ICD-10-CM | POA: Insufficient documentation

## 2022-09-21 DIAGNOSIS — Z170 Estrogen receptor positive status [ER+]: Secondary | ICD-10-CM | POA: Insufficient documentation

## 2022-09-21 DIAGNOSIS — D0511 Intraductal carcinoma in situ of right breast: Secondary | ICD-10-CM | POA: Diagnosis not present

## 2022-09-21 DIAGNOSIS — Z801 Family history of malignant neoplasm of trachea, bronchus and lung: Secondary | ICD-10-CM | POA: Diagnosis not present

## 2022-09-21 DIAGNOSIS — Z79810 Long term (current) use of selective estrogen receptor modulators (SERMs): Secondary | ICD-10-CM | POA: Diagnosis not present

## 2022-09-21 DIAGNOSIS — Z923 Personal history of irradiation: Secondary | ICD-10-CM | POA: Insufficient documentation

## 2022-09-21 NOTE — Progress Notes (Signed)
SURVIVORSHIP VISIT:   BRIEF ONCOLOGIC HISTORY:  Oncology History  Ductal carcinoma in situ (DCIS) of right breast  02/23/2022 Mammogram   3D screening mammogram showed indeterminate right breast calcifications.   03/12/2022 Pathology Results   Pathology from the right breast lumpectomy showed intermediate grade DCIS, negative for invasive carcinoma.  Prognostic showed ER 95% positive strong staining PR 95% positive strong staining   03/20/2022 Initial Diagnosis   Ductal carcinoma in situ (DCIS) of right breast   04/01/2022 Definitive Surgery   She had right breast lumpectomy on September 27 which once again showed a 38 mm measuring DCIS, intermediate grade, foci of atypical ductal epithelium seen at the anterior margin and close proximity to DCIS.  Questionable positive margin.    Genetic Testing   Ambry CustomNext+RNA was Negative. Report date is 05/27/2022.  The CustomNext gene panel offered by [employer] includes sequencing, rearrangement analysis, and RNA analysis for the following 38 genes:  APC, ATM, AXIN2, BARD1, BMPR1A, BRCA1, BRCA2, BRIP1, CDH1, CDK4, CDKN2A, CHEK2, DICER1, HOXB13, EPCAM, GREM1, MLH1, MSH2, MSH3, MSH6, MUTYH, NBN, NF1, NTHL1, PALB2, PMS2, POLD1, POLE, PRKAR1A, PTEN, RAD51C, RAD51D, RECQL, RET, SMAD4, SMARCA4, STK11, and TP53.    05/11/2022 - 06/09/2022 Radiation Therapy   Right breast:  40.05 Gy in 15 treatments "Boost": 10 Gy in 5 treatments    06/2022 -  Anti-estrogen oral therapy   Tamoxifen     INTERVAL HISTORY:  Ms. Stiff to review her survivorship care plan detailing her treatment course for breast cancer, as well as monitoring long-term side effects of that treatment, education regarding health maintenance, screening, and overall wellness and health promotion.     Overall, Ms. Limone reports feeling quite well.  She is taking Tamoxifen daily and notes hot flashes that are manageable as well as leg pains.    REVIEW OF SYSTEMS:  Review of  Systems  Constitutional:  Negative for appetite change, chills, fatigue, fever and unexpected weight change.  HENT:   Negative for hearing loss, lump/mass and trouble swallowing.   Eyes:  Negative for eye problems and icterus.  Respiratory:  Negative for chest tightness, cough and shortness of breath.   Cardiovascular:  Negative for chest pain, leg swelling and palpitations.  Gastrointestinal:  Negative for abdominal distention, abdominal pain, constipation, diarrhea, nausea and vomiting.  Endocrine: Positive for hot flashes.  Genitourinary:  Negative for difficulty urinating.   Musculoskeletal:  Positive for arthralgias.  Skin:  Negative for itching and rash.  Neurological:  Negative for dizziness, extremity weakness, headaches and numbness.  Hematological:  Negative for adenopathy. Does not bruise/bleed easily.  Psychiatric/Behavioral:  Negative for depression. The patient is not nervous/anxious.    Breast: Denies any new nodularity, masses, tenderness, nipple changes, or nipple discharge.        PAST MEDICAL/SURGICAL HISTORY:  Past Medical History:  Diagnosis Date   Allergy    Anxiety    Asthma    Breast cancer ([place])    right breast   Cancer ([place])    skin, basal cell   Essential hypertension    History of kidney stones    Insomnia    Migraines    very rarely now, more prior to menopause   TMJ (dislocation of temporomandibular joint)    Past Surgical History:  Procedure Laterality Date   BREAST BIOPSY  2023   BREAST LUMPECTOMY WITH RADIOACTIVE SEED LOCALIZATION Right 04/01/2022   Procedure: RIGHT BREAST LUMPECTOMY WITH RADIOACTIVE SEED LOCALIZATION;  Surgeon: Jovita Kussmaul, MD;  Location: [facility];  Service: General;  Laterality: Right;   HAND SURGERY Left 2021   schwannoma removal   HEMORRHOID SURGERY  11/04/2003   RE-EXCISION OF BREAST CANCER,SUPERIOR MARGINS Right 04/14/2022   Procedure: RE-EXCISION OF RIGHT BREAST ANTERIOR MARGIN;  Surgeon: Jovita Kussmaul, MD;  Location: [facility];  Service: General;  Laterality: Right;   SKIN LESION EXCISION     TUBAL LIGATION       ALLERGIES:  No Known Allergies   CURRENT MEDICATIONS:  Outpatient Encounter Medications as of 09/21/2022  Medication Sig   acetaminophen (TYLENOL) 500 MG tablet Take 500 mg by mouth every 6 (six) hours as needed for moderate pain.   albuterol (PROVENTIL HFA;VENTOLIN HFA) 108 (90 Base) MCG/ACT inhaler INHALE TWO PUFFS BY MOUTH EVERY 4 HOURS AS NEEDED FOR  WHEEZE   amoxicillin-clavulanate (AUGMENTIN) 875-125 MG tablet Take 1 tablet by mouth every 12 (twelve) hours for 10 days.   cetirizine (ZYRTEC) 10 MG tablet Take 1 tablet by mouth daily as needed (allergies).   chlorthalidone (HYGROTON) 25 MG tablet Take 1 tablet (25 mg total) by mouth daily. In the am   Cholecalciferol (VITAMIN D) 50 MCG (2000 UT) CAPS Take 2,000 Units by mouth daily.   famotidine (PEPCID) 20 MG tablet Take 1 tablet (20 mg total) by mouth at bedtime.   fluticasone (FLONASE) 50 MCG/ACT nasal spray Place 1 spray into both nostrils daily.   losartan (COZAAR) 100 MG tablet Take 1 tablet (100 mg total) by mouth daily.   Multiple Vitamin (MULTIVITAMIN) tablet Take 1 tablet by mouth daily.   Omega-3 Fatty Acids (FISH OIL PO) Take 1,400 mg by mouth daily.   sertraline (ZOLOFT) 50 MG tablet Take 1 tablet (50 mg total) by mouth daily.   simvastatin (ZOCOR) 40 MG tablet Take 1 tablet (40 mg total) by mouth daily.   tamoxifen (NOLVADEX) 20 MG tablet Take 1 tablet (20 mg total) by mouth daily.   zolpidem (AMBIEN) 10 MG tablet TAKE 1 TABLET BY MOUTH AT BEDTIME AS NEEDED FOR SLEEP   No facility-administered encounter medications on file as of 09/21/2022.     ONCOLOGIC FAMILY HISTORY:  Family History  Problem Relation Age of Onset   Depression Mother    Breast cancer Mother 55       negative genetic testing   Depression Father    Hyperlipidemia Sister    Thyroid cancer Sister 35   Hyperlipidemia Brother     Lung cancer Maternal Aunt    Breast cancer Maternal Grandmother    Heart attack Maternal Grandmother    Stomach cancer Paternal Grandfather 20   Nephrolithiasis Daughter    Colon polyps Daughter    Breast cancer Cousin 80       maternal first cousin     SOCIAL HISTORY:  Social History   Socioeconomic History   Marital status: Divorced    Spouse name: Not on file   Number of children: 2   Years of education: Not on file   Highest education level: Not on file  Occupational History   Not on file  Tobacco Use   Smoking status: Never   Smokeless tobacco: Never  Vaping Use   Vaping Use: Never used  Substance and Sexual Activity   Alcohol use: Yes    Alcohol/week: 0.0 standard drinks of alcohol    Comment: 1-2 weekly   Drug use: No   Sexual activity: Yes  Other Topics Concern   Not on file  Social History Narrative  Not on file   Social Determinants of Health   Financial Resource Strain: Low Risk  (06/03/2022)   Overall Financial Resource Strain (CARDIA)    Difficulty of Paying Living Expenses: Not hard at all  Food Insecurity: No Food Insecurity (06/03/2022)   Hunger Vital Sign    Worried About Running Out of Food in the Last Year: Never true    Ran Out of Food in the Last Year: Never true  Transportation Needs: No Transportation Needs (06/03/2022)   PRAPARE - Hydrologist (Medical): No    Lack of Transportation (Non-Medical): No  Physical Activity: Sufficiently Active (06/03/2022)   Exercise Vital Sign    Days of Exercise per Week: 7 days    Minutes of Exercise per Session: 40 min  Stress: No Stress Concern Present (06/03/2022)   [place]    Feeling of Stress : Not at all  Social Connections: Unknown (06/03/2022)   Social Connection and Isolation Panel [NHANES]    Frequency of Communication with Friends and Family: More than three times a week    Frequency of Social  Gatherings with Friends and Family: More than three times a week    Attends Religious Services: Never    Marine scientist or Organizations: No    Attends Archivist Meetings: Never    Marital Status: Not on file  Intimate Partner Violence: Not At Risk (06/03/2022)   Humiliation, Afraid, Rape, and Kick questionnaire    Fear of Current or Ex-Partner: No    Emotionally Abused: No    Physically Abused: No    Sexually Abused: No     OBSERVATIONS/OBJECTIVE:  BP (!) 133/58 (BP Location: Left Arm, Patient Position: Sitting) Comment: nurse notified  Pulse 83   Temp 97.9 F (36.6 C) (Temporal)   Resp 14   Ht 5' 4.5" (1.638 m)   Wt 143 lb 9.6 oz (65.1 kg)   SpO2 99%   BMI 24.27 kg/m  GENERAL: Patient is a well appearing female in no acute distress HEENT:  Sclerae anicteric.  Oropharynx clear and moist. No ulcerations or evidence of oropharyngeal candidiasis. Neck is supple.  NODES:  No cervical, supraclavicular, or axillary lymphadenopathy palpated.  BREAST EXAM:  Deferred. LUNGS:  Clear to auscultation bilaterally.  No wheezes or rhonchi. HEART:  Regular rate and rhythm. No murmur appreciated. ABDOMEN:  Soft, nontender.  Positive, normoactive bowel sounds. No organomegaly palpated. MSK:  No focal spinal tenderness to palpation. Full range of motion bilaterally in the upper extremities. EXTREMITIES:  No peripheral edema.   SKIN:  Clear with no obvious rashes or skin changes. No nail dyscrasia. NEURO:  Nonfocal. Well oriented.  Appropriate affect.   LABORATORY DATA:  None for this visit.  DIAGNOSTIC IMAGING:  None for this visit.      ASSESSMENT AND PLAN:  Ms.. Maltbie is a pleasant 72 y.o. female with Stage 0 right breast DCIS, ER+/PR+, diagnosed in 02/2022, treated with lumpectomy, adjuvant radiation therapy, and anti-estrogen therapy with Tamoxifen beginning in 06/2022.  She presents to the Survivorship Clinic for our initial meeting and routine follow-up  post-completion of treatment for breast cancer.    1. Stage 0 right breast cancer:  Ms. Michalski is continuing to recover from definitive treatment for breast cancer. She will follow-up with her medical oncologist, Dr. Chryl Heck in 6 months with history and physical exam per surveillance protocol.  She will continue her anti-estrogen therapy with tamoxifen.  Thus far, she is tolerating the Tamoxifen well, with minimal side effects. Her mammogram is due 02/2023; orders placed today.   Today, a comprehensive survivorship care plan and treatment summary was reviewed with the patient today detailing her breast cancer diagnosis, treatment course, potential late/long-term effects of treatment, appropriate follow-up care with recommendations for the future, and patient education resources.  A copy of this summary, along with a letter will be sent to the patient's primary care provider via mail/fax/In Basket message after today's visit.    2. Bone health:  She was given education on specific activities to promote bone health.  I reviewed with her that Tamoxifen has a protective effect on the bones  3. Cancer screening:  Due to Ms. Canter's history and her age, she should receive screening for skin cancers, colon cancer, and gynecologic cancers.  The information and recommendations are listed on the patient's comprehensive care plan/treatment summary and were reviewed in detail with the patient.    4. Health maintenance and wellness promotion: Ms. Dolney was encouraged to consume 5-7 servings of fruits and vegetables per day. We reviewed the "Nutrition Rainbow" handout.  She was also encouraged to engage in moderate to vigorous exercise for 30 minutes per day most days of the week.  She was instructed to limit her alcohol consumption and continue to abstain from tobacco use.     5. Support services/counseling: It is not uncommon for this period of the patient's cancer care trajectory to be one of many emotions and  stressors.  She was given information regarding our available services and encouraged to contact me with any questions or for help enrolling in any of our support group/programs.    Follow up instructions:    -Return to cancer center in 6 months for f/u with Dr. Chryl Heck  -Mammogram due in 02/2023 -She is welcome to return back to the Survivorship Clinic at any time; no additional follow-up needed at this time.  -Consider referral back to survivorship as a long-term survivor for continued surveillance  The patient was provided an opportunity to ask questions and all were answered. The patient agreed with the plan and demonstrated an understanding of the instructions.   Total encounter time:30 minutes*in face-to-face visit time, chart review, lab review, care coordination, order entry, and documentation of the encounter time.    Wilber Bihari, NP 09/21/22 1:15 PM Medical Oncology and Hematology [facility] [place], [place] [id number] Tel. [phone]    Fax. [phone]  *Total Encounter Time as defined by the Centers for Medicare and Medicaid Services includes, in addition to the face-to-face time of a patient visit (documented in the note above) non-face-to-face time: obtaining and reviewing outside history, ordering and reviewing medications, tests or procedures, care coordination (communications with other health care professionals or caregivers) and documentation in the medical record.

## 2022-09-22 ENCOUNTER — Other Ambulatory Visit: Payer: Self-pay | Admitting: Family Medicine

## 2022-09-25 ENCOUNTER — Other Ambulatory Visit: Payer: Self-pay | Admitting: Family Medicine

## 2022-09-25 NOTE — Telephone Encounter (Signed)
Name of Medication: Ambien Name of Pharmacy: [facility] Written Date and Quantity: 04/06/22 #30 tabs/ 2 refills  Last Office Visit and Type: F/U on 09/08/22 Next Office Visit and Type: none scheduled

## 2022-10-20 DIAGNOSIS — L570 Actinic keratosis: Secondary | ICD-10-CM | POA: Diagnosis not present

## 2022-10-20 DIAGNOSIS — L821 Other seborrheic keratosis: Secondary | ICD-10-CM | POA: Diagnosis not present

## 2022-10-27 DIAGNOSIS — D0511 Intraductal carcinoma in situ of right breast: Secondary | ICD-10-CM | POA: Diagnosis not present

## 2023-03-10 DIAGNOSIS — Z853 Personal history of malignant neoplasm of breast: Secondary | ICD-10-CM | POA: Diagnosis not present

## 2023-03-10 DIAGNOSIS — D0511 Intraductal carcinoma in situ of right breast: Secondary | ICD-10-CM | POA: Diagnosis not present

## 2023-03-10 LAB — HM MAMMOGRAPHY

## 2023-03-11 ENCOUNTER — Encounter: Payer: Self-pay | Admitting: Family Medicine

## 2023-03-16 ENCOUNTER — Telehealth: Payer: Self-pay

## 2023-03-16 ENCOUNTER — Encounter: Payer: Self-pay | Admitting: Hematology and Oncology

## 2023-03-16 ENCOUNTER — Inpatient Hospital Stay: Payer: PPO | Attending: Hematology and Oncology | Admitting: Hematology and Oncology

## 2023-03-16 VITALS — BP 129/58 | HR 89 | Temp 97.7°F | Resp 16 | Wt 143.1 lb

## 2023-03-16 DIAGNOSIS — D0511 Intraductal carcinoma in situ of right breast: Secondary | ICD-10-CM | POA: Insufficient documentation

## 2023-03-16 DIAGNOSIS — Z923 Personal history of irradiation: Secondary | ICD-10-CM | POA: Diagnosis not present

## 2023-03-16 DIAGNOSIS — Z79810 Long term (current) use of selective estrogen receptor modulators (SERMs): Secondary | ICD-10-CM | POA: Insufficient documentation

## 2023-03-16 NOTE — Telephone Encounter (Signed)
MM diagnostic breast order from Dr. Al Pimple faxed to [facility] on 03/16/23 with successful transmission report received.

## 2023-03-16 NOTE — Progress Notes (Signed)
BRIEF ONCOLOGIC HISTORY:  Oncology History  Ductal carcinoma in situ (DCIS) of right breast  02/23/2022 Mammogram   3D screening mammogram showed indeterminate right breast calcifications.   03/12/2022 Pathology Results   Pathology from the right breast lumpectomy showed intermediate grade DCIS, negative for invasive carcinoma.  Prognostic showed ER 95% positive strong staining PR 95% positive strong staining   03/20/2022 Initial Diagnosis   Ductal carcinoma in situ (DCIS) of right breast   04/01/2022 Definitive Surgery   She had right breast lumpectomy on September 27 which once again showed a 38 mm measuring DCIS, intermediate grade, foci of atypical ductal epithelium seen at the anterior margin and close proximity to DCIS.  Questionable positive margin.    Genetic Testing   Ambry CustomNext+RNA was Negative. Report date is 05/27/2022.  The CustomNext gene panel offered by [employer] includes sequencing, rearrangement analysis, and RNA analysis for the following 38 genes:  APC, ATM, AXIN2, BARD1, BMPR1A, BRCA1, BRCA2, BRIP1, CDH1, CDK4, CDKN2A, CHEK2, DICER1, HOXB13, EPCAM, GREM1, MLH1, MSH2, MSH3, MSH6, MUTYH, NBN, NF1, NTHL1, PALB2, PMS2, POLD1, POLE, PRKAR1A, PTEN, RAD51C, RAD51D, RECQL, RET, SMAD4, SMARCA4, STK11, and TP53.    05/11/2022 - 06/09/2022 Radiation Therapy   Right breast:  40.05 Gy in 15 treatments "Boost": 10 Gy in 5 treatments    06/2022 -  Anti-estrogen oral therapy   Tamoxifen   09/21/2022 Cancer Staging   Staging form: Breast, AJCC 8th Edition - Pathologic: Stage 0 (pTis (DCIS), pN0, cM0) - Signed by Loa Socks, NP on 09/21/2022 Stage prefix: Initial diagnosis     INTERVAL HISTORY  She started taking tamoxifen in Dec 2023. She has noticed some hot flashes, mood swings, and some LE cramps. She says everything is tolerable and she wants to try it for more time. If she continues to have these symptoms, she may want to do something different. She is  exercising regularly, walks every day 40/45 daily Rest of the pertinent 10 point ROS reviewed and neg.  REVIEW OF SYSTEMS:  Review of Systems  Constitutional:  Negative for appetite change, chills, fatigue, fever and unexpected weight change.  HENT:   Negative for hearing loss, lump/mass and trouble swallowing.   Eyes:  Negative for eye problems and icterus.  Respiratory:  Negative for chest tightness, cough and shortness of breath.   Cardiovascular:  Negative for chest pain, leg swelling and palpitations.  Gastrointestinal:  Negative for abdominal distention, abdominal pain, constipation, diarrhea, nausea and vomiting.  Endocrine: Positive for hot flashes.  Genitourinary:  Negative for difficulty urinating.   Musculoskeletal:  Positive for arthralgias.  Skin:  Negative for itching and rash.  Neurological:  Negative for dizziness, extremity weakness, headaches and numbness.  Hematological:  Negative for adenopathy. Does not bruise/bleed easily.  Psychiatric/Behavioral:  Negative for depression. The patient is not nervous/anxious.    Breast: Denies any new nodularity, masses, tenderness, nipple changes, or nipple discharge.        PAST MEDICAL/SURGICAL HISTORY:  Past Medical History:  Diagnosis Date   Allergy    Anxiety    Asthma    Breast cancer ([facility])    right breast   Cancer ([facility])    skin, basal cell   Essential hypertension    History of kidney stones    Insomnia    Migraines    very rarely now, more prior to menopause   TMJ (dislocation of temporomandibular joint)    Past Surgical History:  Procedure Laterality Date   BREAST BIOPSY  2023  BREAST LUMPECTOMY WITH RADIOACTIVE SEED LOCALIZATION Right 04/01/2022   Procedure: RIGHT BREAST LUMPECTOMY WITH RADIOACTIVE SEED LOCALIZATION;  Surgeon: Griselda Miner, MD;  Location: [facility];  Service: General;  Laterality: Right;   HAND SURGERY Left 2021   schwannoma removal   HEMORRHOID SURGERY  11/04/2003    RE-EXCISION OF BREAST CANCER,SUPERIOR MARGINS Right 04/14/2022   Procedure: RE-EXCISION OF RIGHT BREAST ANTERIOR MARGIN;  Surgeon: Chevis Pretty III, MD;  Location[facility];  Service: General;  Laterality: Right;   SKIN LESION EXCISION     TUBAL LIGATION       ALLERGIES:  No Known Allergies   CURRENT MEDICATIONS:  Outpatient Encounter Medications as of 03/16/2023  Medication Sig   acetaminophen (TYLENOL) 500 MG tablet Take 500 mg by mouth every 6 (six) hours as needed for moderate pain.   albuterol (PROVENTIL HFA;VENTOLIN HFA) 108 (90 Base) MCG/ACT inhaler INHALE TWO PUFFS BY MOUTH EVERY 4 HOURS AS NEEDED FOR  WHEEZE   cetirizine (ZYRTEC) 10 MG tablet Take 1 tablet by mouth daily as needed (allergies).   chlorthalidone (HYGROTON) 25 MG tablet TAKE 1 TABLET BY MOUTH ONCE DAILY IN THE MORNING   Cholecalciferol (VITAMIN D) 50 MCG (2000 UT) CAPS Take 2,000 Units by mouth daily.   famotidine (PEPCID) 20 MG tablet Take 1 tablet (20 mg total) by mouth at bedtime.   fluticasone (FLONASE) 50 MCG/ACT nasal spray Place 1 spray into both nostrils daily.   losartan (COZAAR) 100 MG tablet Take 1 tablet (100 mg total) by mouth daily.   Multiple Vitamin (MULTIVITAMIN) tablet Take 1 tablet by mouth daily.   Omega-3 Fatty Acids (FISH OIL PO) Take 1,400 mg by mouth daily.   sertraline (ZOLOFT) 50 MG tablet Take 1 tablet (50 mg total) by mouth daily.   simvastatin (ZOCOR) 40 MG tablet Take 1 tablet (40 mg total) by mouth daily.   tamoxifen (NOLVADEX) 20 MG tablet Take 1 tablet (20 mg total) by mouth daily.   zolpidem (AMBIEN) 10 MG tablet TAKE 1 TABLET BY MOUTH AT BEDTIME AS NEEDED FOR SLEEP   No facility-administered encounter medications on file as of 03/16/2023.     ONCOLOGIC FAMILY HISTORY:  Family History  Problem Relation Age of Onset   Depression Mother    Breast cancer Mother 73       negative genetic testing   Depression Father    Hyperlipidemia Sister    Thyroid cancer Sister 73    Hyperlipidemia Brother    Lung cancer Maternal Aunt    Breast cancer Maternal Grandmother    Heart attack Maternal Grandmother    Stomach cancer Paternal Grandfather 15   Nephrolithiasis Daughter    Colon polyps Daughter    Breast cancer Cousin 72       maternal first cousin     SOCIAL HISTORY:  Social History   Socioeconomic History   Marital status: Divorced    Spouse name: Not on file   Number of children: 2   Years of education: Not on file   Highest education level: Not on file  Occupational History   Not on file  Tobacco Use   Smoking status: Never   Smokeless tobacco: Never  Vaping Use   Vaping status: Never Used  Substance and Sexual Activity   Alcohol use: Yes    Alcohol/week: 0.0 standard drinks of alcohol    Comment: 1-2 weekly   Drug use: No   Sexual activity: Yes  Other Topics Concern   Not  on file  Social History Narrative   Not on file   Social Determinants of Health   Financial Resource Strain: Low Risk  (06/03/2022)   Overall Financial Resource Strain (CARDIA)    Difficulty of Paying Living Expenses: Not hard at all  Food Insecurity: No Food Insecurity (06/03/2022)   Hunger Vital Sign    Worried About Running Out of Food in the Last Year: Never true    Ran Out of Food in the Last Year: Never true  Transportation Needs: No Transportation Needs (06/03/2022)   PRAPARE - Administrator, Civil Service (Medical): No    Lack of Transportation (Non-Medical): No  Physical Activity: Sufficiently Active (06/03/2022)   Exercise Vital Sign    Days of Exercise per Week: 7 days    Minutes of Exercise per Session: 40 min  Stress: No Stress Concern Present (06/03/2022)   [employer] of Occupational Health - Occupational Stress Questionnaire    Feeling of Stress : Not at all  Social Connections: Unknown (06/03/2022)   Social Connection and Isolation Panel [NHANES]    Frequency of Communication with Friends and Family: More than three times  a week    Frequency of Social Gatherings with Friends and Family: More than three times a week    Attends Religious Services: Never    Database administrator or Organizations: No    Attends Banker Meetings: Never    Marital Status: Not on file  Intimate Partner Violence: Not At Risk (06/03/2022)   Humiliation, Afraid, Rape, and Kick questionnaire    Fear of Current or Ex-Partner: No    Emotionally Abused: No    Physically Abused: No    Sexually Abused: No     OBSERVATIONS/OBJECTIVE:  BP (!) 129/58 (BP Location: Left Arm, Patient Position: Sitting)   Pulse 89   Temp 97.7 F (36.5 C) (Tympanic)   Resp 16   Wt 143 lb 1.6 oz (64.9 kg)   SpO2 99%   BMI 24.18 kg/m  GENERAL: Patient is a well appearing female in no acute distress Bilateral breasts inspected and palpated.  Right breast postsurgery.  No palpable masses or regional adenopathy No lower extremity edema   LABORATORY DATA:  None for this visit.  DIAGNOSTIC IMAGING:  None for this visit.      ASSESSMENT AND PLAN:  Ms.. Dukett is a pleasant 72 y.o. female with Stage 0 right breast DCIS, ER+/PR+, diagnosed in 02/2022, treated with lumpectomy, adjuvant radiation therapy, and anti-estrogen therapy with Tamoxifen beginning in 06/2022.    She is tolerating tamoxifen well except for hot flashes, intermittent mood swings and lower extremity cramps.  She is willing to try it for a bit longer.  We have also discussed about alternate options for antiestrogen therapy with aromatase inhibitors including anastrozole, letrozole, exemestane  No concerns on physical exam.  Most recent mammogram done at [facility] with no concerns, repeat mammogram due in September 2025, this has been ordered.  In basket message sent to fax her order to [facility] for September of next year. If she is unable to tolerate tamoxifen, she will give [place] a call.  We briefly discussed about the low-dose tamoxifen in the TAM 01 study which has shown very good  results.  She had excellent questions about why tamoxifen increases risk of endometrial cancer and have gone over this.  Thank you for consulting [place] in the care of this patient.  Please do not hesitate to contact us with any additional  questions or concerns  *Total Encounter Time as defined by the Centers for Medicare and Medicaid Services includes, in addition to the face-to-face time of a patient visit (documented in the note above) non-face-to-face time: obtaining and reviewing outside history, ordering and reviewing medications, tests or procedures, care coordination (communications with other health care professionals or caregivers) and documentation in the medical record.

## 2023-03-17 ENCOUNTER — Ambulatory Visit (INDEPENDENT_AMBULATORY_CARE_PROVIDER_SITE_OTHER): Payer: PPO

## 2023-03-17 DIAGNOSIS — Z0000 Encounter for general adult medical examination without abnormal findings: Secondary | ICD-10-CM

## 2023-03-17 NOTE — Patient Instructions (Signed)
Ms. Lorah , Thank you for taking time to come for your Medicare Wellness Visit. I appreciate your ongoing commitment to your health goals. Please review the following plan we discussed and let me know if I can assist you in the future.   Referrals/Orders/Follow-Ups/Clinician Recommendations: none  This is a list of the screening recommended for you and due dates:  Health Maintenance  Topic Date Due   Flu Shot  02/04/2023   COVID-19 Vaccine (6 - 2023-24 season) 03/07/2023   Colon Cancer Screening  02/01/2024   Mammogram  03/09/2024   Medicare Annual Wellness Visit  03/16/2024   DTaP/Tdap/Td vaccine (3 - Td or Tdap) 08/13/2024   Pneumonia Vaccine  Completed   DEXA scan (bone density measurement)  Completed   Hepatitis C Screening  Completed   Zoster (Shingles) Vaccine  Completed   HPV Vaccine  Aged Out    Advanced directives: (Copy Requested) Please bring a copy of your health care power of attorney and living will to the office to be added to your chart at your convenience.  Next Medicare Annual Wellness Visit scheduled for next year: Yes  Insert Preventive Care attachment Insert FALL PREVENTION attachment if needed

## 2023-03-17 NOTE — Progress Notes (Signed)
Subjective:   Taylor Ewing is a 72 y.o. female who presents for Medicare Annual (Subsequent) preventive examination.  Visit Complete: Virtual  I connected with  Mardelle Matte on 03/17/23 by a audio enabled telemedicine application and verified that I am speaking with the correct person using two identifiers.  Patient Location: Home  Provider Location: Office/Clinic  I discussed the limitations of evaluation and management by telemedicine. The patient expressed understanding and agreed to proceed.  Patient Medicare AWV questionnaire was completed by the patient on 03/14/2023; I have confirmed that all information answered by patient is correct and no changes since this date.  Vital Signs: Unable to obtain new vitals due to this being a telehealth visit.  Review of Systems     Cardiac Risk Factors include: advanced age (>39men, >58 women);hypertension     Objective:    Today's Vitals   There is no height or weight on file to calculate BMI.     03/17/2023   11:45 AM 07/16/2022   11:00 AM 06/03/2022   10:04 AM 04/27/2022    7:53 AM 04/01/2022   11:15 AM 03/20/2022    7:42 AM 11/10/2019    9:03 AM  Advanced Directives  Does Patient Have a Medical Advance Directive? No No No No No No No  Would patient like information on creating a medical advance directive?  No - Patient declined No - Patient declined No - Patient declined No - Patient declined  No - Patient declined    Current Medications (verified) Outpatient Encounter Medications as of 03/17/2023  Medication Sig   acetaminophen (TYLENOL) 500 MG tablet Take 500 mg by mouth every 6 (six) hours as needed for moderate pain.   albuterol (PROVENTIL HFA;VENTOLIN HFA) 108 (90 Base) MCG/ACT inhaler INHALE TWO PUFFS BY MOUTH EVERY 4 HOURS AS NEEDED FOR  WHEEZE   cetirizine (ZYRTEC) 10 MG tablet Take 1 tablet by mouth daily as needed (allergies).   chlorthalidone (HYGROTON) 25 MG tablet TAKE 1 TABLET BY MOUTH ONCE DAILY IN THE  MORNING   Cholecalciferol (VITAMIN D) 50 MCG (2000 UT) CAPS Take 2,000 Units by mouth daily.   famotidine (PEPCID) 20 MG tablet Take 1 tablet (20 mg total) by mouth at bedtime.   fluticasone (FLONASE) 50 MCG/ACT nasal spray Place 1 spray into both nostrils daily.   losartan (COZAAR) 100 MG tablet Take 1 tablet (100 mg total) by mouth daily.   Multiple Vitamin (MULTIVITAMIN) tablet Take 1 tablet by mouth daily.   Omega-3 Fatty Acids (FISH OIL PO) Take 1,400 mg by mouth daily.   sertraline (ZOLOFT) 50 MG tablet Take 1 tablet (50 mg total) by mouth daily.   simvastatin (ZOCOR) 40 MG tablet Take 1 tablet (40 mg total) by mouth daily.   tamoxifen (NOLVADEX) 20 MG tablet Take 1 tablet (20 mg total) by mouth daily.   zolpidem (AMBIEN) 10 MG tablet TAKE 1 TABLET BY MOUTH AT BEDTIME AS NEEDED FOR SLEEP   No facility-administered encounter medications on file as of 03/17/2023.    Allergies (verified) Patient has no known allergies.   History: Past Medical History:  Diagnosis Date   Allergy    Anxiety    Asthma    Breast cancer (HCC)    right breast   Cancer (HCC)    skin, basal cell   Essential hypertension    History of kidney stones    Insomnia    Migraines    very rarely now, more prior to menopause  TMJ (dislocation of temporomandibular joint)    Past Surgical History:  Procedure Laterality Date   BREAST BIOPSY  2023   BREAST LUMPECTOMY WITH RADIOACTIVE SEED LOCALIZATION Right 04/01/2022   Procedure: RIGHT BREAST LUMPECTOMY WITH RADIOACTIVE SEED LOCALIZATION;  Surgeon: Griselda Miner, MD;  Location: [facility];  Service: General;  Laterality: Right;   HAND SURGERY Left 2021   schwannoma removal   HEMORRHOID SURGERY  11/04/2003   RE-EXCISION OF BREAST CANCER,SUPERIOR MARGINS Right 04/14/2022   Procedure: RE-EXCISION OF RIGHT BREAST ANTERIOR MARGIN;  Surgeon: Griselda Miner, MD;  Location: [facility] OR;  Service: General;  Laterality: Right;   SKIN LESION EXCISION      TUBAL LIGATION     Family History  Problem Relation Age of Onset   Depression Mother    Breast cancer Mother 36       negative genetic testing   Depression Father    Hyperlipidemia Sister    Thyroid cancer Sister 56   Hyperlipidemia Brother    Lung cancer Maternal Aunt    Breast cancer Maternal Grandmother    Heart attack Maternal Grandmother    Stomach cancer Paternal Grandfather 62   Nephrolithiasis Daughter    Colon polyps Daughter    Breast cancer Cousin 60       maternal first cousin   Social History   Socioeconomic History   Marital status: Divorced    Spouse name: Not on file   Number of children: 2   Years of education: Not on file   Highest education level: Not on file  Occupational History   Not on file  Tobacco Use   Smoking status: Never   Smokeless tobacco: Never  Vaping Use   Vaping status: Never Used  Substance and Sexual Activity   Alcohol use: Yes    Alcohol/week: 0.0 standard drinks of alcohol    Comment: 1-2 weekly   Drug use: No   Sexual activity: Yes  Other Topics Concern   Not on file  Social History Narrative   Not on file   Social Determinants of Health   Financial Resource Strain: Low Risk  (03/14/2023)   Overall Financial Resource Strain (CARDIA)    Difficulty of Paying Living Expenses: Not hard at all  Food Insecurity: No Food Insecurity (03/14/2023)   Hunger Vital Sign    Worried About Running Out of Food in the Last Year: Never true    Ran Out of Food in the Last Year: Never true  Transportation Needs: No Transportation Needs (03/14/2023)   PRAPARE - Administrator, Civil Service (Medical): No    Lack of Transportation (Non-Medical): No  Physical Activity: Sufficiently Active (03/14/2023)   Exercise Vital Sign    Days of Exercise per Week: 6 days    Minutes of Exercise per Session: 40 min  Stress: Stress Concern Present (03/14/2023)   [employer] of Occupational Health - Occupational Stress Questionnaire    Feeling  of Stress : To some extent  Social Connections: Socially Isolated (03/14/2023)   Social Connection and Isolation Panel [NHANES]    Frequency of Communication with Friends and Family: Three times a week    Frequency of Social Gatherings with Friends and Family: Once a week    Attends Religious Services: Never    Database administrator or Organizations: No    Attends Banker Meetings: Never    Marital Status: Divorced    Tobacco Counseling Counseling given: Not Answered  Clinical Intake:  Pre-visit preparation completed: Yes  Pain : No/denies pain     Nutritional Risks: None Diabetes: No  How often do you need to have someone help you when you read instructions, pamphlets, or other written materials from your doctor or pharmacy?: 1 - Never  Interpreter Needed?: No  Information entered by :: NAllen LPN   Activities of Daily Living    03/14/2023    9:37 PM 06/03/2022   10:07 AM  In your present state of health, do you have any difficulty performing the following activities:  Hearing? 0 0  Vision? 0 0  Difficulty concentrating or making decisions? 0 0  Walking or climbing stairs? 0 0  Dressing or bathing? 0 0  Doing errands, shopping? 0 0  Preparing Food and eating ? N N  Using the Toilet? N N  In the past six months, have you accidently leaked urine? N N  Do you have problems with loss of bowel control? N N  Managing your Medications? N N  Managing your Finances? N N  Housekeeping or managing your Housekeeping? N N    Patient Care Team: Tower, Audrie Gallus, MD as PCP - General Lonie Peak, MD as Attending Physician (Radiation Oncology) Rachel Moulds, MD as Consulting Physician (Hematology and Oncology) Griselda Miner, MD as Consulting Physician (General Surgery) [facility]  Indicate any recent Medical Services you may have received from other than [facility] providers in the past year (date may be approximate).     Assessment:   This is a  routine wellness examination for [place].  Hearing/Vision screen Hearing Screening - Comments:: Denies hearing issues Vision Screening - Comments:: Regular eye exams, [facility]   Goals Addressed             This Visit's Progress    Patient Stated       03/17/2023, wants to join a class or gym to help muscles       Depression Screen    03/17/2023   11:46 AM 08/25/2022   10:24 AM 06/03/2022   10:09 AM 07/15/2021    8:54 AM 11/10/2019    9:04 AM 11/19/2017   12:29 PM 06/15/2012    2:58 PM  PHQ 2/9 Scores  PHQ - 2 Score 0 0 0 0 0 0 0  PHQ- 9 Score 0 2   0      Fall Risk    03/14/2023    9:37 PM 08/25/2022   10:24 AM 06/03/2022   10:13 AM 07/15/2021    8:53 AM 11/10/2019    9:03 AM  Fall Risk   Falls in the past year? 0 0 0 0 0  Number falls in past yr: 0 0 0 0 0  Injury with Fall? 0 0 0  0  Risk for fall due to : Medication side effect No Fall Risks No Fall Risks  No Fall Risks  Follow up Falls prevention discussed;Falls evaluation completed Falls evaluation completed Falls evaluation completed;Falls prevention discussed Falls evaluation completed Falls evaluation completed;Falls prevention discussed    MEDICARE RISK AT HOME: Medicare Risk at Home Any stairs in or around the home?: Yes If so, are there any without handrails?: No Home free of loose throw rugs in walkways, pet beds, electrical cords, etc?: Yes Adequate lighting in your home to reduce risk of falls?: Yes Life alert?: No Use of a cane, walker or w/c?: No Grab bars in the bathroom?: No Shower chair or bench in shower?: No  Elevated toilet seat or a handicapped toilet?: No  TIMED UP AND GO:  Was the test performed?  No    Cognitive Function:    11/10/2019    9:12 AM  MMSE - Mini Mental State Exam  Orientation to time 5  Orientation to Place 5  Registration 3  Attention/ Calculation 5  Recall 3  Language- repeat 1        03/17/2023   11:47 AM 06/03/2022   10:14 AM  6CIT Screen  What Year?  0 points 0 points  What month? 0 points 0 points  What time? 0 points 0 points  Count back from 20 0 points 0 points  Months in reverse 0 points 0 points  Repeat phrase 2 points 0 points  Total Score 2 points 0 points    Immunizations Immunization History  Administered Date(s) Administered   Fluad Quad(high Dose 65+) 06/06/2019, 04/24/2022, 03/11/2023   Influenza Split 06/15/2012   Influenza, High Dose Seasonal PF 07/12/2018   Influenza,inj,Quad PF,6+ Mos 08/13/2014, 09/03/2015   Influenza-Unspecified 05/15/2021   PFIZER(Purple Top)SARS-COV-2 Vaccination 07/27/2019, 08/17/2019, 04/19/2020, 12/20/2020   Pfizer Covid-19 Vaccine Bivalent Booster 66yrs & up 04/24/2022   Pneumococcal Conjugate-13 06/06/2019   Pneumococcal Polysaccharide-23 11/13/2019   Respiratory Syncytial Virus Vaccine,Recomb Aduvanted(Arexvy) 04/24/2022   Td 07/30/2003   Tdap 08/13/2014   Unspecified SARS-COV-2 Vaccination 03/11/2023   Zoster Recombinant(Shingrix) 06/06/2019, 10/24/2019    TDAP status: Up to date  Flu Vaccine status: Up to date  Pneumococcal vaccine status: Up to date  Covid-19 vaccine status: Completed vaccines  Qualifies for Shingles Vaccine? Yes   Zostavax completed Yes   Shingrix Completed?: Yes  Screening Tests Health Maintenance  Topic Date Due   COVID-19 Vaccine (7 - 2023-24 season) 05/06/2023   Colonoscopy  02/01/2024   MAMMOGRAM  03/09/2024   Medicare Annual Wellness (AWV)  03/16/2024   DTaP/Tdap/Td (3 - Td or Tdap) 08/13/2024   Pneumonia Vaccine 45+ Years old  Completed   INFLUENZA VACCINE  Completed   DEXA SCAN  Completed   Hepatitis C Screening  Completed   Zoster Vaccines- Shingrix  Completed   HPV VACCINES  Aged Out    Health Maintenance  There are no preventive care reminders to display for this patient.   Colorectal cancer screening: Type of screening: Colonoscopy. Completed 01/31/2014. Repeat every 10 years  Mammogram status: Completed 03/10/2023. Repeat  every year  Bone Density status: Completed 06/22/2022.   Lung Cancer Screening: (Low Dose CT Chest recommended if Age 49-80 years, 20 pack-year currently smoking OR have quit w/in 15years.) does not qualify.   Lung Cancer Screening Referral: no  Additional Screening:  Hepatitis C Screening: does qualify; Completed 09/03/2015  Vision Screening: Recommended annual ophthalmology exams for early detection of glaucoma and other disorders of the eye. Is the patient up to date with their annual eye exam?  Yes  Who is the provider or what is the name of the office in which the patient attends annual eye exams? [facility] If pt is not established with a provider, would they like to be referred to a provider to establish care? No .   Dental Screening: Recommended annual dental exams for proper oral hygiene  Diabetic Foot Exam: n/a  Community Resource Referral / Chronic Care Management: CRR required this visit?  No   CCM required this visit?  No     Plan:     I have personally reviewed and noted the following in the patient's chart:  Medical and social history Use of alcohol, tobacco or illicit drugs  Current medications and supplements including opioid prescriptions. Patient is not currently taking opioid prescriptions. Functional ability and status Nutritional status Physical activity Advanced directives List of other physicians Hospitalizations, surgeries, and ER visits in previous 12 months Vitals Screenings to include cognitive, depression, and falls Referrals and appointments  In addition, I have reviewed and discussed with patient certain preventive protocols, quality metrics, and best practice recommendations. A written personalized care plan for preventive services as well as general preventive health recommendations were provided to patient.     Barb Merino, LPN   1/61/0960   After Visit Summary: (MyChart) Due to this being a telephonic visit, the after  visit summary with patients personalized plan was offered to patient via MyChart   Nurse Notes: none

## 2023-03-18 ENCOUNTER — Other Ambulatory Visit: Payer: Self-pay | Admitting: Family Medicine

## 2023-03-19 NOTE — Telephone Encounter (Signed)
LAST APPOINTMENT DATE: 09/08/22   NEXT APPOINTMENT DATE: Visit date not found  Due for AWV after 08/27/23  LAST REFILL:09/07/22  QTY: #30 5ZD

## 2023-03-23 ENCOUNTER — Other Ambulatory Visit: Payer: Self-pay | Admitting: Family Medicine

## 2023-05-19 ENCOUNTER — Other Ambulatory Visit: Payer: Self-pay | Admitting: Family Medicine

## 2023-05-19 NOTE — Telephone Encounter (Signed)
LAST APPOINTMENT DATE: f/u on 09/08/22     NEXT APPOINTMENT DATE: none scheduled      LAST REFILL:03/19/23   QTY: #30 tab/ 0 refills

## 2023-06-07 ENCOUNTER — Telehealth: Payer: Self-pay | Admitting: Family Medicine

## 2023-06-07 NOTE — Telephone Encounter (Signed)
FYI: This call has been transferred to Access Nurse. Once the result note has been entered staff can address the message at that time.  Patient called in with the following symptoms:  Red Word:blood in stool, patient says this has been ongoing for about a week    Please advise at Mobile [phone] (mobile)  Message is routed to Provider Pool and [facility] Triage

## 2023-06-07 NOTE — Telephone Encounter (Signed)
Per appt notes pt already has appt with Allayne Gitelman NP on 06/08/23 at 9:20. Sending note to Allayne Gitelman NP who is out of office and FYI to Dr Milinda Antis and [employer] pool.

## 2023-06-07 NOTE — Telephone Encounter (Signed)
Aware Agree with ER precautions Thanks

## 2023-06-08 ENCOUNTER — Encounter: Payer: Self-pay | Admitting: Family Medicine

## 2023-06-08 ENCOUNTER — Ambulatory Visit (INDEPENDENT_AMBULATORY_CARE_PROVIDER_SITE_OTHER): Payer: PPO | Admitting: Family Medicine

## 2023-06-08 ENCOUNTER — Ambulatory Visit: Payer: PPO | Admitting: Primary Care

## 2023-06-08 VITALS — BP 136/82 | HR 79 | Temp 98.1°F | Ht 64.5 in | Wt 143.5 lb

## 2023-06-08 DIAGNOSIS — K648 Other hemorrhoids: Secondary | ICD-10-CM | POA: Diagnosis not present

## 2023-06-08 DIAGNOSIS — K921 Melena: Secondary | ICD-10-CM | POA: Diagnosis not present

## 2023-06-08 LAB — CBC WITH DIFFERENTIAL/PLATELET
Basophils Absolute: 0.1 10*3/uL (ref 0.0–0.1)
Basophils Relative: 0.7 % (ref 0.0–3.0)
Eosinophils Absolute: 0.1 10*3/uL (ref 0.0–0.7)
Eosinophils Relative: 1.7 % (ref 0.0–5.0)
HCT: 39.7 % (ref 36.0–46.0)
Hemoglobin: 13.2 g/dL (ref 12.0–15.0)
Lymphocytes Relative: 17.7 % (ref 12.0–46.0)
Lymphs Abs: 1.5 10*3/uL (ref 0.7–4.0)
MCHC: 33.1 g/dL (ref 30.0–36.0)
MCV: 92.6 fL (ref 78.0–100.0)
Monocytes Absolute: 0.8 10*3/uL (ref 0.1–1.0)
Monocytes Relative: 9.3 % (ref 3.0–12.0)
Neutro Abs: 5.9 10*3/uL (ref 1.4–7.7)
Neutrophils Relative %: 70.6 % (ref 43.0–77.0)
Platelets: 381 10*3/uL (ref 150.0–400.0)
RBC: 4.29 Mil/uL (ref 3.87–5.11)
RDW: 12.8 % (ref 11.5–15.5)
WBC: 8.3 10*3/uL (ref 4.0–10.5)

## 2023-06-08 LAB — COMPREHENSIVE METABOLIC PANEL
ALT: 66 U/L — ABNORMAL HIGH (ref 0–35)
AST: 60 U/L — ABNORMAL HIGH (ref 0–37)
Albumin: 4.7 g/dL (ref 3.5–5.2)
Alkaline Phosphatase: 70 U/L (ref 39–117)
BUN: 21 mg/dL (ref 6–23)
CO2: 28 meq/L (ref 19–32)
Calcium: 9.8 mg/dL (ref 8.4–10.5)
Chloride: 102 meq/L (ref 96–112)
Creatinine, Ser: 0.79 mg/dL (ref 0.40–1.20)
GFR: 75.82 mL/min (ref >60.00)
Glucose, Bld: 99 mg/dL (ref 70–99)
Potassium: 4.1 meq/L (ref 3.5–5.1)
Sodium: 138 meq/L (ref 135–145)
Total Bilirubin: 0.4 mg/dL (ref 0.2–1.2)
Total Protein: 7.9 g/dL (ref 6.0–8.3)

## 2023-06-08 MED ORDER — HYDROCORTISONE ACETATE 25 MG RE SUPP: 25.0000 mg | Freq: Two times a day (BID) | RECTAL | 1 refills | Status: DC

## 2023-06-08 NOTE — Assessment & Plan Note (Addendum)
In the setting of ibuprofen and constipation- likely cause of recent increased rectal bleeding/ blood in stool  Notable on anoscopy today  Discussed goal of not straining Instructed to stop nsaids  Instructed to try miralax to keep stools soft and drink fluids   Prescription anusol hc supp to use bid for 7 d with refill  Ref to GI  Lab today

## 2023-06-08 NOTE — Assessment & Plan Note (Signed)
Most likely from hemmorhoids Discussed possible of diverticulosis as well  Reviewed last colonoscopy report from 29 (10 y recall will be this summer for screening)  Lab today  Will treat int hemorrhoids Reassuring exam GI referral done

## 2023-06-08 NOTE — Patient Instructions (Addendum)
Stop ibuprofen (avoid aleve)  Tylenol for pain or or fever   Use miralax regularly to keep stools soft so you don't strain   Drink lots of fluids  Lab today   Try the ansusol supp as directed for hemorrhoids   I put the referral in for GI  Please let us know if you don't hear in 1-2 weeks  Give Dr Kenna Gilbert office a call later in the week   If the fever comes back please come in  If any abdominal pain come in  If bleeding worsens or gets severe-go to the ER

## 2023-06-08 NOTE — Progress Notes (Signed)
Subjective:    Patient ID: Taylor Ewing, female    DOB: 1951-03-05, 72 y.o.   MRN: [id number]  HPI  Wt Readings from Last 3 Encounters:  06/08/23 143 lb 8 oz (65.1 kg)  03/16/23 143 lb 1.6 oz (64.9 kg)  09/21/22 143 lb 9.6 oz (65.1 kg)   24.25 kg/m  Vitals:   06/08/23 1056  BP: 136/82  Pulse: 79  Temp: 98.1 F (36.7 C)  SpO2: 98%    Pt c/o blood in stools  More than usual in last week  Does have hemorrhoids and in past it comes and goes   Last night she passed a clot-over an inch  A little darker  Has urge to have bm at times/feeling of fullness  Constipation and occational use of senna is not uncommon for her   Last colonoscopy 01/2014  Internal hemorrhoids  Diverticuli   No abd pain at all   Did have some pelvic pain  It is gone now  Had ultrasound- was normal  No vaginal bleeding   Nsaid-takes ibuprofen   No asa  No blood thinners   Taking tamoxifen-is tough on her  Hot flashes/night sweats / leg cramps    Last week   chills with temp of 102  (with aches)  Lasted few days Went away  Never came back     Patient Active Problem List   Diagnosis Date Noted   Internal hemorrhoids 06/08/2023   Blood in stool 06/08/2023   Viral URI 09/08/2022   Malignant neoplasm of lower-inner quadrant of right breast of female, estrogen receptor positive (HCC) 09/08/2022   Genetic testing 06/01/2022   Family history of breast cancer 05/19/2022   Ductal carcinoma in situ (DCIS) of right breast 03/20/2022   Abnormal ultrasound of endometrium 04/09/2020   Arthritis of right hip 03/27/2020   Fatigue 03/22/2020   Osteopenia 02/20/2020   Estrogen deficiency 01/19/2020   Ganglion cyst 11/13/2019   Screening for HIV (human immunodeficiency virus) 09/03/2015   Prediabetes 08/13/2014   Stress reaction, emotional 08/13/2014   Routine general medical examination at a health care facility 06/15/2012   Encounter for routine gynecological examination 06/15/2012    Essential hypertension 02/10/2012   LOW BACK PAIN, CHRONIC 08/14/2008   HYPERCHOLESTEROLEMIA 01/28/2007   Anxiety disorder 01/28/2007   ALLERGIC RHINITIS 01/28/2007   Asthma 01/28/2007   TMJ SYNDROME 01/28/2007   MIGRAINES, HX OF 01/28/2007   Past Medical History:  Diagnosis Date   Allergy    Anxiety    Asthma    Breast cancer (HCC)    right breast   Cancer (HCC)    skin, basal cell   Essential hypertension    History of kidney stones    Insomnia    Migraines    very rarely now, more prior to menopause   TMJ (dislocation of temporomandibular joint)    Past Surgical History:  Procedure Laterality Date   BREAST BIOPSY  2023   BREAST LUMPECTOMY WITH RADIOACTIVE SEED LOCALIZATION Right 04/01/2022   Procedure: RIGHT BREAST LUMPECTOMY WITH RADIOACTIVE SEED LOCALIZATION;  Surgeon: Griselda Miner, MD;  Location: [facility];  Service: General;  Laterality: Right;   HAND SURGERY Left 2021   schwannoma removal   HEMORRHOID SURGERY  11/04/2003   RE-EXCISION OF BREAST CANCER,SUPERIOR MARGINS Right 04/14/2022   Procedure: RE-EXCISION OF RIGHT BREAST ANTERIOR MARGIN;  Surgeon: Griselda Miner, MD;  Location: [facility];  Service: General;  Laterality: Right;   SKIN LESION EXCISION  TUBAL LIGATION     Social History   Tobacco Use   Smoking status: Never   Smokeless tobacco: Never  Vaping Use   Vaping status: Never Used  Substance Use Topics   Alcohol use: Yes    Alcohol/week: 0.0 standard drinks of alcohol    Comment: 1-2 weekly   Drug use: No   Family History  Problem Relation Age of Onset   Depression Mother    Breast cancer Mother 75       negative genetic testing   Depression Father    Hyperlipidemia Sister    Thyroid cancer Sister 41   Hyperlipidemia Brother    Lung cancer Maternal Aunt    Breast cancer Maternal Grandmother    Heart attack Maternal Grandmother    Stomach cancer Paternal Grandfather 52   Nephrolithiasis Daughter    Colon polyps Daughter     Breast cancer Cousin 30       maternal first cousin   No Known Allergies Current Outpatient Medications on File Prior to Visit  Medication Sig Dispense Refill   acetaminophen (TYLENOL) 500 MG tablet Take 500 mg by mouth every 6 (six) hours as needed for moderate pain.     albuterol (PROVENTIL HFA;VENTOLIN HFA) 108 (90 Base) MCG/ACT inhaler INHALE TWO PUFFS BY MOUTH EVERY 4 HOURS AS NEEDED FOR  WHEEZE 9 g 1   cetirizine (ZYRTEC) 10 MG tablet Take 1 tablet by mouth daily as needed (allergies).     chlorthalidone (HYGROTON) 25 MG tablet TAKE 1 TABLET BY MOUTH ONCE DAILY IN THE MORNING 90 tablet 1   famotidine (PEPCID) 20 MG tablet Take 1 tablet (20 mg total) by mouth at bedtime. 90 tablet 3   fluticasone (FLONASE) 50 MCG/ACT nasal spray Place 1 spray into both nostrils daily. 15.8 mL 0   losartan (COZAAR) 100 MG tablet Take 1 tablet (100 mg total) by mouth daily. 90 tablet 3   Multiple Vitamin (MULTIVITAMIN) tablet Take 1 tablet by mouth daily.     Omega-3 Fatty Acids (FISH OIL PO) Take 1,400 mg by mouth daily.     sennosides-docusate sodium (SENOKOT-S) 8.6-50 MG tablet Take 1 tablet by mouth daily as needed for constipation.     sertraline (ZOLOFT) 50 MG tablet Take 1 tablet (50 mg total) by mouth daily. 90 tablet 3   simvastatin (ZOCOR) 40 MG tablet Take 1 tablet (40 mg total) by mouth daily. 90 tablet 3   tamoxifen (NOLVADEX) 20 MG tablet Take 1 tablet (20 mg total) by mouth daily. 90 tablet 3   zolpidem (AMBIEN) 10 MG tablet TAKE 1 TABLET BY MOUTH AT BEDTIME AS NEEDED FOR SLEEP 30 tablet 0   No current facility-administered medications on file prior to visit.    Review of Systems  Constitutional:  Negative for activity change, appetite change, fatigue, fever and unexpected weight change.  HENT:  Negative for congestion, ear pain, rhinorrhea, sinus pressure and sore throat.   Eyes:  Negative for pain, redness and visual disturbance.  Respiratory:  Negative for cough, shortness of  breath and wheezing.   Cardiovascular:  Negative for chest pain and palpitations.  Gastrointestinal:  Positive for blood in stool and constipation. Negative for abdominal pain, diarrhea and rectal pain.       Streaks of blood in stool Occational clot    Endocrine: Negative for polydipsia and polyuria.  Genitourinary:  Negative for dysuria, frequency and urgency.  Musculoskeletal:  Negative for arthralgias, back pain and myalgias.  Skin:  Negative for  pallor and rash.  Allergic/Immunologic: Negative for environmental allergies.  Neurological:  Negative for dizziness, syncope and headaches.  Hematological:  Negative for adenopathy. Does not bruise/bleed easily.  Psychiatric/Behavioral:  Negative for decreased concentration and dysphoric mood. The patient is not nervous/anxious.        Objective:   Physical Exam Constitutional:      General: She is not in acute distress.    Appearance: Normal appearance. She is well-developed and normal weight. She is not ill-appearing or diaphoretic.  HENT:     Head: Normocephalic and atraumatic.  Eyes:     Conjunctiva/sclera: Conjunctivae normal.     Pupils: Pupils are equal, round, and reactive to light.  Neck:     Thyroid: No thyromegaly.     Vascular: No carotid bruit or JVD.  Cardiovascular:     Rate and Rhythm: Normal rate and regular rhythm.     Heart sounds: Normal heart sounds.     No gallop.  Pulmonary:     Effort: Pulmonary effort is normal. No respiratory distress.     Breath sounds: Normal breath sounds. No wheezing or rales.  Abdominal:     General: Abdomen is flat. There is no distension or abdominal bruit.     Palpations: Abdomen is soft. There is no mass.     Tenderness: There is no abdominal tenderness. There is no guarding or rebound.     Hernia: No hernia is present.  Genitourinary:    Rectum: Guaiac result positive. External hemorrhoid and internal hemorrhoid present. No mass, tenderness or anal fissure. Normal anal tone.      Comments: One hemorrhoidal skin tag external 12:00 Multiple internal hemorrhoids - at least 2 at 10:00 and larger one at 5:00  Not actively bleeding Not thrombosed Not tender Not prolapsed  Musculoskeletal:     Cervical back: Normal range of motion and neck supple.     Right lower leg: No edema.     Left lower leg: No edema.  Lymphadenopathy:     Cervical: No cervical adenopathy.  Skin:    General: Skin is warm and dry.     Coloration: Skin is not jaundiced or pale.     Findings: No bruising or rash.  Neurological:     Mental Status: She is alert.     Coordination: Coordination normal.     Deep Tendon Reflexes: Reflexes are normal and symmetric. Reflexes normal.  Psychiatric:        Mood and Affect: Mood normal.           Assessment & Plan:   Problem List Items Addressed This Visit       Cardiovascular and Mediastinum   Internal hemorrhoids - Primary    In the setting of ibuprofen and constipation- likely cause of recent increased rectal bleeding/ blood in stool  Exam noted  Discussed goal of not straining Instructed to stop nsaids  Instructed to try miralax to keep stools soft and drink fluids   Prescription anusol hc supp to use bid for 7 d with refill  Ref to GI  Lab today       Relevant Orders   Ambulatory referral to Gastroenterology     Other   Blood in stool    Most likely from hemmorhoids Discussed possible of diverticulosis as well  Reviewed last colonoscopy report from 60 (10 y recall will be this summer for screening)  Lab today  Will treat int hemorrhoids Reassuring exam GI referral done  Relevant Orders   CBC with Differential/Platelet   Comprehensive metabolic panel   Ambulatory referral to Gastroenterology

## 2023-06-09 ENCOUNTER — Other Ambulatory Visit: Payer: Self-pay | Admitting: Hematology and Oncology

## 2023-06-12 ENCOUNTER — Encounter: Payer: Self-pay | Admitting: *Deleted

## 2023-06-22 ENCOUNTER — Telehealth: Payer: Self-pay | Admitting: Family Medicine

## 2023-06-22 DIAGNOSIS — R7401 Elevation of levels of liver transaminase levels: Secondary | ICD-10-CM | POA: Insufficient documentation

## 2023-06-22 DIAGNOSIS — E7800 Pure hypercholesterolemia, unspecified: Secondary | ICD-10-CM

## 2023-06-22 NOTE — Telephone Encounter (Signed)
-----   Message from Alvina Chou sent at 06/11/2023  2:52 PM EST ----- Regarding: Lab orders for [facility], 12.18.24 Lab orders for repeat lab work, Thanks

## 2023-06-23 ENCOUNTER — Other Ambulatory Visit (INDEPENDENT_AMBULATORY_CARE_PROVIDER_SITE_OTHER): Payer: PPO

## 2023-06-23 DIAGNOSIS — R7401 Elevation of levels of liver transaminase levels: Secondary | ICD-10-CM | POA: Diagnosis not present

## 2023-06-23 LAB — HEPATIC FUNCTION PANEL
ALT: 53 U/L — ABNORMAL HIGH (ref 0–35)
AST: 51 U/L — ABNORMAL HIGH (ref 0–37)
Albumin: 4.5 g/dL (ref 3.5–5.2)
Alkaline Phosphatase: 68 U/L (ref 39–117)
Bilirubin, Direct: 0.1 mg/dL (ref 0.0–0.3)
Total Bilirubin: 0.4 mg/dL (ref 0.2–1.2)
Total Protein: 7.4 g/dL (ref 6.0–8.3)

## 2023-06-23 LAB — IBC PANEL
Iron: 66 ug/dL (ref 42–145)
Saturation Ratios: 18.8 % — ABNORMAL LOW (ref 20.0–50.0)
TIBC: 351.4 ug/dL (ref 250.0–450.0)
Transferrin: 251 mg/dL (ref 212.0–360.0)

## 2023-06-24 LAB — ACUTE HEP PANEL AND HEP B SURFACE AB
HEPATITIS C ANTIBODY REFILL$(REFL): NONREACTIVE
Hep A IgM: NONREACTIVE
Hep B C IgM: NONREACTIVE
Hepatitis B Surface Ag: NONREACTIVE

## 2023-06-24 LAB — REFLEX TIQ

## 2023-06-24 NOTE — Addendum Note (Signed)
Addended by: Roxy Manns A on: 06/24/2023 02:46 PM   Modules accepted: Orders

## 2023-06-25 DIAGNOSIS — Z1211 Encounter for screening for malignant neoplasm of colon: Secondary | ICD-10-CM | POA: Diagnosis not present

## 2023-06-25 DIAGNOSIS — K5730 Diverticulosis of large intestine without perforation or abscess without bleeding: Secondary | ICD-10-CM | POA: Diagnosis not present

## 2023-06-25 DIAGNOSIS — K625 Hemorrhage of anus and rectum: Secondary | ICD-10-CM | POA: Diagnosis not present

## 2023-06-25 DIAGNOSIS — K5909 Other constipation: Secondary | ICD-10-CM | POA: Diagnosis not present

## 2023-07-05 DIAGNOSIS — Z1211 Encounter for screening for malignant neoplasm of colon: Secondary | ICD-10-CM | POA: Diagnosis not present

## 2023-07-05 DIAGNOSIS — K644 Residual hemorrhoidal skin tags: Secondary | ICD-10-CM | POA: Diagnosis not present

## 2023-07-05 DIAGNOSIS — K625 Hemorrhage of anus and rectum: Secondary | ICD-10-CM | POA: Diagnosis not present

## 2023-07-05 DIAGNOSIS — K5730 Diverticulosis of large intestine without perforation or abscess without bleeding: Secondary | ICD-10-CM | POA: Diagnosis not present

## 2023-07-05 DIAGNOSIS — K648 Other hemorrhoids: Secondary | ICD-10-CM | POA: Diagnosis not present

## 2023-07-05 LAB — HM COLONOSCOPY

## 2023-07-14 ENCOUNTER — Other Ambulatory Visit: Payer: Self-pay | Admitting: Family Medicine

## 2023-07-15 NOTE — Telephone Encounter (Signed)
 LAST APPOINTMENT DATE: blood in stool 06/08/23    NEXT APPOINTMENT DATE: none scheduled     LAST REFILL:05/19/23   QTY: #30 tab/ 0 refills

## 2023-07-26 ENCOUNTER — Encounter: Payer: Self-pay | Admitting: Family Medicine

## 2023-07-26 ENCOUNTER — Other Ambulatory Visit (INDEPENDENT_AMBULATORY_CARE_PROVIDER_SITE_OTHER): Payer: PPO

## 2023-07-26 DIAGNOSIS — R7401 Elevation of levels of liver transaminase levels: Secondary | ICD-10-CM

## 2023-07-26 DIAGNOSIS — E7800 Pure hypercholesterolemia, unspecified: Secondary | ICD-10-CM

## 2023-07-26 LAB — HEPATIC FUNCTION PANEL
ALT: 67 U/L — ABNORMAL HIGH (ref 0–35)
AST: 50 U/L — ABNORMAL HIGH (ref 0–37)
Albumin: 4.5 g/dL (ref 3.5–5.2)
Alkaline Phosphatase: 59 U/L (ref 39–117)
Bilirubin, Direct: 0 mg/dL (ref 0.0–0.3)
Total Bilirubin: 0.3 mg/dL (ref 0.2–1.2)
Total Protein: 7.2 g/dL (ref 6.0–8.3)

## 2023-07-26 LAB — LIPID PANEL
Cholesterol: 293 mg/dL — ABNORMAL HIGH (ref 0–200)
HDL: 36.4 mg/dL — ABNORMAL LOW (ref >39.00)
LDL Cholesterol: 189 mg/dL — ABNORMAL HIGH (ref 0–99)
NonHDL: 256.35
Total CHOL/HDL Ratio: 8
Triglycerides: 337 mg/dL — ABNORMAL HIGH (ref 0.0–149.0)
VLDL: 67.4 mg/dL — ABNORMAL HIGH (ref 0.0–40.0)

## 2023-07-29 ENCOUNTER — Telehealth: Payer: Self-pay | Admitting: *Deleted

## 2023-07-29 NOTE — Telephone Encounter (Signed)
Per Dr. Milinda Antis pt need to f/u in office to discuss labs (pt is aware via mychart) please schedule an appt with PCP when able. thanks

## 2023-07-30 NOTE — Telephone Encounter (Signed)
Patient has been scheduled

## 2023-08-04 ENCOUNTER — Encounter: Payer: Self-pay | Admitting: Family Medicine

## 2023-08-04 ENCOUNTER — Ambulatory Visit: Payer: PPO | Admitting: Family Medicine

## 2023-08-04 VITALS — BP 136/82 | HR 73 | Temp 98.0°F | Ht 64.5 in | Wt 145.1 lb

## 2023-08-04 DIAGNOSIS — F419 Anxiety disorder, unspecified: Secondary | ICD-10-CM

## 2023-08-04 DIAGNOSIS — J0100 Acute maxillary sinusitis, unspecified: Secondary | ICD-10-CM

## 2023-08-04 DIAGNOSIS — J019 Acute sinusitis, unspecified: Secondary | ICD-10-CM | POA: Insufficient documentation

## 2023-08-04 DIAGNOSIS — K760 Fatty (change of) liver, not elsewhere classified: Secondary | ICD-10-CM | POA: Diagnosis not present

## 2023-08-04 DIAGNOSIS — R7401 Elevation of levels of liver transaminase levels: Secondary | ICD-10-CM

## 2023-08-04 MED ORDER — AMOXICILLIN-POT CLAVULANATE 875-125 MG PO TABS: 1.0000 | ORAL_TABLET | Freq: Two times a day (BID) | ORAL | 0 refills | Status: DC

## 2023-08-04 NOTE — Assessment & Plan Note (Signed)
Worse recently  Pt notes this at end of visit on way out  On zoloft 50 mg  Tamoxifen may worsen mood  Also stress of breast cancer  PHQ 4  No SI GAD7 score of 8 Interested in counseling  Referral done

## 2023-08-04 NOTE — Patient Instructions (Addendum)
Take augmentin for sinus infection  Use nasal saline spray / breathe steam  If not improving - let us know (call)  - would consider adding prednisone    Drink lots of fluids    I put the referral in for abdominal US and also [facility]  Please let us know if you don't hear in 1-2 weeks to set that up

## 2023-08-04 NOTE — Assessment & Plan Note (Signed)
Lab Results  Component Value Date   ALT 67 (H) 07/26/2023   AST 50 (H) 07/26/2023   ALKPHOS 59 07/26/2023   BILITOT 0.3 07/26/2023    No symptoms History of breast cancer and on tamoxifen (which may worsen or cause fatty liver according to source)  Almost never takes tylenol No alcohol   Will order [place] abd  Reassuring exam today

## 2023-08-04 NOTE — Progress Notes (Signed)
Subjective:    Patient ID: Taylor Ewing, female    DOB: 11-06-1950, 73 y.o.   MRN: [id number]  HPI  Wt Readings from Last 3 Encounters:  08/04/23 145 lb 2 oz (65.8 kg)  06/08/23 143 lb 8 oz (65.1 kg)  03/16/23 143 lb 1.6 oz (64.9 kg)   24.53 kg/m  Vitals:   08/04/23 0925  BP: 136/82  Pulse: 73  Temp: 98 F (36.7 C)  SpO2: 98%    Pt presents with c/o right ear pain  Also to discuss labs /elevated lfts  Also mood changes    Having some anxiety and depression symptoms  Going on a while  No abuse  Not in danger    Stress  Age History of breast cancer     08/04/2023    9:39 AM 06/08/2023   11:06 AM 03/17/2023   11:46 AM 08/25/2022   10:24 AM 06/03/2022   10:09 AM  Depression screen PHQ 2/9  Decreased Interest 1 1 0 0 0  Down, Depressed, Hopeless 1 1 0 0 0  PHQ - 2 Score 2 2 0 0 0  Altered sleeping 1 1 0 1   Tired, decreased energy 0 1 0 0   Change in appetite 1 1 0 1   Feeling bad or failure about yourself  0 0 0 0   Trouble concentrating 0 0 0 0   Moving slowly or fidgety/restless 0 0 0 0   Suicidal thoughts 0 0 0 0   PHQ-9 Score 4 5 0 2   Difficult doing work/chores Somewhat difficult Not difficult at all Not difficult at all Not difficult at all       08/04/2023    9:40 AM 06/08/2023   11:06 AM 08/25/2022   10:25 AM  GAD 7 : Generalized Anxiety Score  Nervous, Anxious, on Edge 1 1 1   Control/stop worrying 1 1 1   Worry too much - different things 1 2 1   Trouble relaxing 1 2 1   Restless 1 1 0  Easily annoyed or irritable 2 2 1   Afraid - awful might happen 1 1 1   Total GAD 7 Score 8 10 6   Anxiety Difficulty Somewhat difficult Not difficult at all Not difficult at all   More and more anxious  Harder to function  More stress  Affects her physically  Wants to talk to someone  Tamoxifen can play a role Takes sertraline 50 mg daily / would rather not go up on dose or add meds     Ear pain / about a week  May be sinus related  Sharp -like ice  pick  Constant  Trying to stay off pain reliever   Right cheek is painful/ maxillary sinus  Mucous is not moving   No fever  No cough     Lab Results  Component Value Date   ALT 67 (H) 07/26/2023   AST 50 (H) 07/26/2023   ALKPHOS 59 07/26/2023   BILITOT 0.3 07/26/2023   Had to take a tylenol 500 mg last night (otherwise not taking)  Off simvastatin -no change in liver labs   Alcohol-none   Tamoxifen - ? If causing this /onc     Lab Results  Component Value Date   CHOL 293 (H) 07/26/2023   CHOL 187 08/18/2022   CHOL 167 07/15/2021   Lab Results  Component Value Date   HDL 36.40 (L) 07/26/2023   HDL 34.40 (L) 08/18/2022   HDL 42.70 07/15/2021  Lab Results  Component Value Date   LDLCALC 189 (H) 07/26/2023   LDLCALC 83 11/10/2019   LDLCALC 80 11/19/2017   Lab Results  Component Value Date   TRIG 337.0 (H) 07/26/2023   TRIG 275.0 (H) 08/18/2022   TRIG 210.0 (H) 07/15/2021   Lab Results  Component Value Date   CHOLHDL 8 07/26/2023   CHOLHDL 5 08/18/2022   CHOLHDL 4 07/15/2021   Lab Results  Component Value Date   LDLDIRECT 105.0 08/18/2022   LDLDIRECT 89.0 07/15/2021   LDLDIRECT 71.0 11/29/2018   Recent iron labs Lab Results  Component Value Date   IRON 66 06/23/2023   TIBC 351.4 06/23/2023   Lab Results  Component Value Date   HEPAIGM NON-REACTIVE 06/23/2023   HEPBIGM NON-REACTIVE 06/23/2023   Hep C neg in 2017 Neg HIV in 2017     Patient Active Problem List   Diagnosis Date Noted   Acute sinusitis 08/04/2023   Elevated liver transaminase level 06/22/2023   Internal hemorrhoids 06/08/2023   Blood in stool 06/08/2023   Malignant neoplasm of lower-inner quadrant of right breast of female, estrogen receptor positive (HCC) 09/08/2022   Genetic testing 06/01/2022   Family history of breast cancer 05/19/2022   Ductal carcinoma in situ (DCIS) of right breast 03/20/2022   Abnormal ultrasound of endometrium 04/09/2020   Arthritis of right  hip 03/27/2020   Fatigue 03/22/2020   Osteopenia 02/20/2020   Estrogen deficiency 01/19/2020   Ganglion cyst 11/13/2019   Screening for HIV (human immunodeficiency virus) 09/03/2015   Prediabetes 08/13/2014   Routine general medical examination at a health care facility 06/15/2012   Encounter for routine gynecological examination 06/15/2012   Essential hypertension 02/10/2012   LOW BACK PAIN, CHRONIC 08/14/2008   HYPERCHOLESTEROLEMIA 01/28/2007   Anxiety disorder 01/28/2007   ALLERGIC RHINITIS 01/28/2007   Asthma 01/28/2007   TMJ SYNDROME 01/28/2007   MIGRAINES, HX OF 01/28/2007   Past Medical History:  Diagnosis Date   Allergy    Anxiety    Asthma    Breast cancer (HCC)    right breast   Cancer (HCC)    skin, basal cell   Essential hypertension    History of kidney stones    Insomnia    Migraines    very rarely now, more prior to menopause   TMJ (dislocation of temporomandibular joint)    Past Surgical History:  Procedure Laterality Date   BREAST BIOPSY  2023   BREAST LUMPECTOMY WITH RADIOACTIVE SEED LOCALIZATION Right 04/01/2022   Procedure: RIGHT BREAST LUMPECTOMY WITH RADIOACTIVE SEED LOCALIZATION;  Surgeon: Griselda Miner, MD;  Location: [facility];  Service: General;  Laterality: Right;   HAND SURGERY Left 2021   schwannoma removal   HEMORRHOID SURGERY  11/04/2003   RE-EXCISION OF BREAST CANCER,SUPERIOR MARGINS Right 04/14/2022   Procedure: RE-EXCISION OF RIGHT BREAST ANTERIOR MARGIN;  Surgeon: Griselda Miner, MD;  Location: [facility];  Service: General;  Laterality: Right;   SKIN LESION EXCISION     TUBAL LIGATION     Social History   Tobacco Use   Smoking status: Never   Smokeless tobacco: Never  Vaping Use   Vaping status: Never Used  Substance Use Topics   Alcohol use: Yes    Alcohol/week: 0.0 standard drinks of alcohol    Comment: 1-2 weekly   Drug use: No   Family History  Problem Relation Age of Onset   Depression Mother    Breast  cancer Mother 93  negative genetic testing   Depression Father    Hyperlipidemia Sister    Thyroid cancer Sister 88   Hyperlipidemia Brother    Lung cancer Maternal Aunt    Breast cancer Maternal Grandmother    Heart attack Maternal Grandmother    Stomach cancer Paternal Grandfather 97   Nephrolithiasis Daughter    Colon polyps Daughter    Breast cancer Cousin 52       maternal first cousin   No Known Allergies Current Outpatient Medications on File Prior to Visit  Medication Sig Dispense Refill   albuterol (PROVENTIL HFA;VENTOLIN HFA) 108 (90 Base) MCG/ACT inhaler INHALE TWO PUFFS BY MOUTH EVERY 4 HOURS AS NEEDED FOR  WHEEZE 9 g 1   cetirizine (ZYRTEC) 10 MG tablet Take 1 tablet by mouth daily as needed (allergies).     chlorthalidone (HYGROTON) 25 MG tablet TAKE 1 TABLET BY MOUTH ONCE DAILY IN THE MORNING 90 tablet 1   famotidine (PEPCID) 20 MG tablet Take 1 tablet (20 mg total) by mouth at bedtime. 90 tablet 3   fluticasone (FLONASE) 50 MCG/ACT nasal spray Place 1 spray into both nostrils daily. 15.8 mL 0   losartan (COZAAR) 100 MG tablet Take 1 tablet (100 mg total) by mouth daily. 90 tablet 3   Multiple Vitamin (MULTIVITAMIN) tablet Take 1 tablet by mouth daily.     Omega-3 Fatty Acids (FISH OIL PO) Take 1,400 mg by mouth daily.     sennosides-docusate sodium (SENOKOT-S) 8.6-50 MG tablet Take 1 tablet by mouth daily as needed for constipation.     sertraline (ZOLOFT) 50 MG tablet Take 1 tablet (50 mg total) by mouth daily. 90 tablet 3   simvastatin (ZOCOR) 40 MG tablet Take 1 tablet (40 mg total) by mouth daily. 90 tablet 3   tamoxifen (NOLVADEX) 20 MG tablet Take 1 tablet by mouth once daily 90 tablet 0   zolpidem (AMBIEN) 10 MG tablet TAKE 1 TABLET BY MOUTH AT BEDTIME AS NEEDED FOR SLEEP 30 tablet 0   No current facility-administered medications on file prior to visit.    Review of Systems  Constitutional:  Positive for fatigue. Negative for appetite change and fever.   HENT:  Positive for congestion, ear pain, postnasal drip, rhinorrhea, sinus pressure and sore throat. Negative for nosebleeds.   Eyes:  Negative for pain, redness and itching.  Respiratory:  Negative for cough, shortness of breath and wheezing.   Cardiovascular:  Negative for chest pain.  Gastrointestinal:  Negative for abdominal distention, abdominal pain, diarrhea, nausea and vomiting.  Endocrine: Negative for polyuria.  Genitourinary:  Negative for dysuria, frequency and urgency.  Musculoskeletal:  Negative for arthralgias and myalgias.  Allergic/Immunologic: Negative for immunocompromised state.  Neurological:  Positive for headaches. Negative for dizziness, tremors, syncope, weakness and numbness.  Hematological:  Negative for adenopathy. Does not bruise/bleed easily.  Psychiatric/Behavioral:  Negative for dysphoric mood. The patient is not nervous/anxious.        Objective:   Physical Exam Constitutional:      General: She is not in acute distress.    Appearance: Normal appearance. She is well-developed. She is not ill-appearing.  HENT:     Head: Normocephalic and atraumatic.     Comments: Mild right maxillary sinus tendeness    Right Ear: Tympanic membrane, ear canal and external ear normal.     Left Ear: Tympanic membrane, ear canal and external ear normal.     Ears:     Comments: Dull TMs    Nose:  Congestion and rhinorrhea present.     Mouth/Throat:     Mouth: Mucous membranes are moist.     Pharynx: Oropharynx is clear. No oropharyngeal exudate or posterior oropharyngeal erythema.     Comments: Clear pnd Eyes:     General: No scleral icterus.       Right eye: No discharge.        Left eye: No discharge.     Conjunctiva/sclera: Conjunctivae normal.     Pupils: Pupils are equal, round, and reactive to light.  Cardiovascular:     Rate and Rhythm: Normal rate and regular rhythm.  Pulmonary:     Effort: Pulmonary effort is normal. No respiratory distress.     Breath  sounds: Normal breath sounds. No stridor. No wheezing, rhonchi or rales.     Comments: Good air exch No rales or rhonchi Abdominal:     General: Abdomen is flat. Bowel sounds are normal. There is no abdominal bruit.     Palpations: There is no shifting dullness, fluid wave, hepatomegaly, splenomegaly, mass or pulsatile mass.     Tenderness: There is no abdominal tenderness.  Musculoskeletal:     Cervical back: Normal range of motion and neck supple. No tenderness.  Lymphadenopathy:     Cervical: No cervical adenopathy.  Skin:    General: Skin is warm and dry.     Findings: No rash.  Neurological:     Mental Status: She is alert.     Cranial Nerves: No cranial nerve deficit.     Coordination: Coordination normal.  Psychiatric:        Attention and Perception: Attention normal.        Mood and Affect: Mood is anxious and depressed. Affect is tearful.        Speech: Speech normal.        Behavior: Behavior normal.        Thought Content: Thought content normal.        Cognition and Memory: Cognition and memory normal.     Comments: Candidly discusses symptoms and stressors              Assessment & Plan:   Problem List Items Addressed This Visit       Respiratory   Acute sinusitis - Primary   Right sided / with ear pain  See AVS for symptom care  Prescription augmentin  Update if not starting to improve in a week or if worsening  Call back and Er precautions noted in detail today        Relevant Medications   amoxicillin-clavulanate (AUGMENTIN) 875-125 MG tablet     Other   Elevated liver transaminase level   Lab Results  Component Value Date   ALT 67 (H) 07/26/2023   AST 50 (H) 07/26/2023   ALKPHOS 59 07/26/2023   BILITOT 0.3 07/26/2023    No symptoms History of breast cancer and on tamoxifen (which may worsen or cause fatty liver according to source)  Almost never takes tylenol No alcohol   Will order [place] abd  Reassuring exam today      Relevant  Orders   US Abdomen Complete   Anxiety disorder   Worse recently  Pt notes this at end of visit on way out  On zoloft 50 mg  Tamoxifen may worsen mood  Also stress of breast cancer  PHQ 4  No SI GAD7 score of 8 Interested in counseling  Referral done

## 2023-08-04 NOTE — Assessment & Plan Note (Signed)
Right sided / with ear pain  See AVS for symptom care  Prescription augmentin  Update if not starting to improve in a week or if worsening  Call back and Er precautions noted in detail today

## 2023-08-05 ENCOUNTER — Encounter: Payer: Self-pay | Admitting: Family Medicine

## 2023-08-05 ENCOUNTER — Ambulatory Visit
Admission: RE | Admit: 2023-08-05 | Discharge: 2023-08-05 | Disposition: A | Discharge status: Home / Self Care | Payer: PPO | Source: Ambulatory Visit | Attending: Family Medicine | Admitting: Family Medicine

## 2023-08-05 DIAGNOSIS — K760 Fatty (change of) liver, not elsewhere classified: Secondary | ICD-10-CM | POA: Insufficient documentation

## 2023-08-05 DIAGNOSIS — K769 Liver disease, unspecified: Secondary | ICD-10-CM | POA: Diagnosis not present

## 2023-08-05 DIAGNOSIS — R7401 Elevation of levels of liver transaminase levels: Secondary | ICD-10-CM

## 2023-08-05 NOTE — Assessment & Plan Note (Signed)
Confirmed by [place] On tamoxifen Lab Results  Component Value Date   ALT 67 (H) 07/26/2023   AST 50 (H) 07/26/2023   ALKPHOS 59 07/26/2023   BILITOT 0.3 07/26/2023

## 2023-08-05 NOTE — Addendum Note (Signed)
Addended by: Roxy Manns A on: 08/05/2023 06:03 PM   Modules accepted: Orders

## 2023-08-06 ENCOUNTER — Telehealth: Payer: Self-pay | Admitting: Hematology and Oncology

## 2023-08-06 NOTE — Telephone Encounter (Signed)
 Spoke with patient confirming upcoming appointment

## 2023-08-10 ENCOUNTER — Inpatient Hospital Stay: Payer: PPO | Attending: Hematology and Oncology | Admitting: Hematology and Oncology

## 2023-08-10 VITALS — BP 120/71 | HR 76 | Temp 97.3°F | Resp 16 | Wt 144.1 lb

## 2023-08-10 DIAGNOSIS — D0511 Intraductal carcinoma in situ of right breast: Secondary | ICD-10-CM

## 2023-08-10 DIAGNOSIS — Z79810 Long term (current) use of selective estrogen receptor modulators (SERMs): Secondary | ICD-10-CM | POA: Insufficient documentation

## 2023-08-10 DIAGNOSIS — Z170 Estrogen receptor positive status [ER+]: Secondary | ICD-10-CM | POA: Diagnosis not present

## 2023-08-10 NOTE — Progress Notes (Signed)
 BRIEF ONCOLOGIC HISTORY:  Oncology History  Ductal carcinoma in situ (DCIS) of right breast  02/23/2022 Mammogram   3D screening mammogram showed indeterminate right breast calcifications.   03/12/2022 Pathology Results   Pathology from the right breast lumpectomy showed intermediate grade DCIS, negative for invasive carcinoma.  Prognostic showed ER 95% positive strong staining PR 95% positive strong staining   03/20/2022 Initial Diagnosis   Ductal carcinoma in situ (DCIS) of right breast   04/01/2022 Definitive Surgery   She had right breast lumpectomy on September 27 which once again showed a 38 mm measuring DCIS, intermediate grade, foci of atypical ductal epithelium seen at the anterior margin and close proximity to DCIS.  Questionable positive margin.    Genetic Testing   [facility] CustomNext+RNA was Negative. Report date is 05/27/2022.  The CustomNext gene panel offered by [employer] includes sequencing, rearrangement analysis, and RNA analysis for the following 38 genes:  APC, ATM, AXIN2, BARD1, BMPR1A, BRCA1, BRCA2, BRIP1, CDH1, CDK4, CDKN2A, CHEK2, DICER1, HOXB13, EPCAM, GREM1, MLH1, MSH2, MSH3, MSH6, MUTYH, NBN, NF1, NTHL1, PALB2, PMS2, POLD1, POLE, PRKAR1A, PTEN, RAD51C, RAD51D, RECQL, RET, SMAD4, SMARCA4, STK11, and TP53.    05/11/2022 - 06/09/2022 Radiation Therapy   Right breast:  40.05 Gy in 15 treatments "Boost": 10 Gy in 5 treatments    06/2022 -  Anti-estrogen oral therapy   Tamoxifen    09/21/2022 Cancer Staging   Staging form: Breast, AJCC 8th Edition - Pathologic: Stage 0 (pTis (DCIS), pN0, cM0) - Signed by Crawford Morna Pickle, NP on 09/21/2022 Stage prefix: Initial diagnosis     INTERVAL HISTORY  Taylor Ewing is a 73 year old female with DCIS intermediate grade who presents with concerns about elevated liver enzymes and side effects from tamoxifen . She was referred by her primary care physician for evaluation of elevated liver enzymes.  She was diagnosed  with DCIS intermediate grade in September 2023, detected radiologically on a mammogram. She underwent surgery in September 2023, which required a second procedure to achieve negative surgical margins. Necrosis was present in the pathology report.  She has been experiencing elevated liver enzymes, initially noted by her primary care physician. In an attempt to identify the cause, she discontinued her cholesterol medication for a month, but her liver enzymes remained elevated, and her cholesterol levels increased significantly. She suspects tamoxifen  as a potential cause of the elevated liver enzymes.  She experiences hot flashes, night sweats, and muscle cramping while on tamoxifen . These symptoms have been uncomfortable, and she has noticed an improvement since discontinuing tamoxifen  three days ago. She is concerned about the impact of tamoxifen  on her liver enzymes and has stopped taking it to see if her liver enzyme levels normalize.  She has a first-degree family history of breast cancer. Rest of the pertinent 10 point ROS reviewed and neg.  REVIEW OF SYSTEMS:  Review of Systems  Constitutional:  Negative for appetite change, chills, fatigue, fever and unexpected weight change.  HENT:   Negative for hearing loss, lump/mass and trouble swallowing.   Eyes:  Negative for eye problems and icterus.  Respiratory:  Negative for chest tightness, cough and shortness of breath.   Cardiovascular:  Negative for chest pain, leg swelling and palpitations.  Gastrointestinal:  Negative for abdominal distention, abdominal pain, constipation, diarrhea, nausea and vomiting.  Endocrine: Positive for hot flashes.  Genitourinary:  Negative for difficulty urinating.   Musculoskeletal:  Positive for arthralgias.  Skin:  Negative for itching and rash.  Neurological:  Negative for dizziness,  extremity weakness, headaches and numbness.  Hematological:  Negative for adenopathy. Does not bruise/bleed easily.   Psychiatric/Behavioral:  Negative for depression. The patient is not nervous/anxious.    Breast: Denies any new nodularity, masses, tenderness, nipple changes, or nipple discharge.        PAST MEDICAL/SURGICAL HISTORY:  Past Medical History:  Diagnosis Date   Allergy    Anxiety    Asthma    Breast cancer (HCC)    right breast   Cancer (HCC)    skin, basal cell   Essential hypertension    History of kidney stones    Insomnia    Migraines    very rarely now, more prior to menopause   TMJ (dislocation of temporomandibular joint)    Past Surgical History:  Procedure Laterality Date   BREAST BIOPSY  2023   BREAST LUMPECTOMY WITH RADIOACTIVE SEED LOCALIZATION Right 04/01/2022   Procedure: RIGHT BREAST LUMPECTOMY WITH RADIOACTIVE SEED LOCALIZATION;  Surgeon: Curvin Deward MOULD, MD;  Location: [facility];  Service: General;  Laterality: Right;   HAND SURGERY Left 2021   schwannoma removal   HEMORRHOID SURGERY  11/04/2003   RE-EXCISION OF BREAST CANCER,SUPERIOR MARGINS Right 04/14/2022   Procedure: RE-EXCISION OF RIGHT BREAST ANTERIOR MARGIN;  Surgeon: Curvin Deward MOULD, MD;  Location: [facility];  Service: General;  Laterality: Right;   SKIN LESION EXCISION     TUBAL LIGATION       ALLERGIES:  No Known Allergies   CURRENT MEDICATIONS:  Outpatient Encounter Medications as of 08/10/2023  Medication Sig   albuterol  (PROVENTIL  HFA;VENTOLIN  HFA) 108 (90 Base) MCG/ACT inhaler INHALE TWO PUFFS BY MOUTH EVERY 4 HOURS AS NEEDED FOR  WHEEZE   amoxicillin -clavulanate (AUGMENTIN ) 875-125 MG tablet Take 1 tablet by mouth 2 (two) times daily.   cetirizine (ZYRTEC) 10 MG tablet Take 1 tablet by mouth daily as needed (allergies).   chlorthalidone  (HYGROTON ) 25 MG tablet TAKE 1 TABLET BY MOUTH ONCE DAILY IN THE MORNING   famotidine  (PEPCID ) 20 MG tablet Take 1 tablet (20 mg total) by mouth at bedtime.   fluticasone  (FLONASE ) 50 MCG/ACT nasal spray Place 1 spray into both nostrils daily.    losartan  (COZAAR ) 100 MG tablet Take 1 tablet (100 mg total) by mouth daily.   Multiple Vitamin (MULTIVITAMIN) tablet Take 1 tablet by mouth daily.   Omega-3 Fatty Acids (FISH OIL PO) Take 1,400 mg by mouth daily.   sennosides-docusate sodium (SENOKOT-S) 8.6-50 MG tablet Take 1 tablet by mouth daily as needed for constipation.   sertraline  (ZOLOFT ) 50 MG tablet Take 1 tablet (50 mg total) by mouth daily.   simvastatin  (ZOCOR ) 40 MG tablet Take 1 tablet (40 mg total) by mouth daily.   tamoxifen  (NOLVADEX ) 20 MG tablet Take 1 tablet by mouth once daily   zolpidem  (AMBIEN ) 10 MG tablet TAKE 1 TABLET BY MOUTH AT BEDTIME AS NEEDED FOR SLEEP   No facility-administered encounter medications on file as of 08/10/2023.     ONCOLOGIC FAMILY HISTORY:  Family History  Problem Relation Age of Onset   Depression Mother    Breast cancer Mother 64       negative genetic testing   Depression Father    Hyperlipidemia Sister    Thyroid  cancer Sister 37   Hyperlipidemia Brother    Lung cancer Maternal Aunt    Breast cancer Maternal Grandmother    Heart attack Maternal Grandmother    Stomach cancer Paternal Grandfather 97   Nephrolithiasis Daughter  Colon polyps Daughter    Breast cancer Cousin 48       maternal first cousin     SOCIAL HISTORY:  Social History   Socioeconomic History   Marital status: Divorced    Spouse name: Not on file   Number of children: 2   Years of education: Not on file   Highest education level: Bachelor's degree (e.g., BA, AB, BS)  Occupational History   Not on file  Tobacco Use   Smoking status: Never   Smokeless tobacco: Never  Vaping Use   Vaping status: Never Used  Substance and Sexual Activity   Alcohol use: Yes    Alcohol/week: 0.0 standard drinks of alcohol    Comment: 1-2 weekly   Drug use: No   Sexual activity: Yes  Other Topics Concern   Not on file  Social History Narrative   Not on file   Social Drivers of Health   Financial Resource  Strain: Low Risk  (07/31/2023)   Overall Financial Resource Strain (CARDIA)    Difficulty of Paying Living Expenses: Not hard at all  Food Insecurity: No Food Insecurity (07/31/2023)   Hunger Vital Sign    Worried About Running Out of Food in the Last Year: Never true    Ran Out of Food in the Last Year: Never true  Transportation Needs: No Transportation Needs (07/31/2023)   PRAPARE - Administrator, Civil Service (Medical): No    Lack of Transportation (Non-Medical): No  Physical Activity: Sufficiently Active (07/31/2023)   Exercise Vital Sign    Days of Exercise per Week: 5 days    Minutes of Exercise per Session: 40 min  Stress: Stress Concern Present (07/31/2023)   [employer] of Occupational Health - Occupational Stress Questionnaire    Feeling of Stress : To some extent  Social Connections: Socially Isolated (07/31/2023)   Social Connection and Isolation Panel [NHANES]    Frequency of Communication with Friends and Family: Three times a week    Frequency of Social Gatherings with Friends and Family: Once a week    Attends Religious Services: Never    Database Administrator or Organizations: No    Attends Banker Meetings: Never    Marital Status: Divorced  Catering Manager Violence: Not At Risk (03/17/2023)   Humiliation, Afraid, Rape, and Kick questionnaire    Fear of Current or Ex-Partner: No    Emotionally Abused: No    Physically Abused: No    Sexually Abused: No     OBSERVATIONS/OBJECTIVE:  BP 120/71 (BP Location: Left Arm, Patient Position: Sitting)   Pulse 76   Temp (!) 97.3 F (36.3 C) (Temporal)   Resp 16   Wt 144 lb 1.6 oz (65.4 kg)   SpO2 97%   BMI 24.35 kg/m   GENERAL: Patient is a well appearing female in no acute distress PE deferred in lieu of counseling.   LABORATORY DATA:  None for this visit.  DIAGNOSTIC IMAGING:  None for this visit.      ASSESSMENT AND PLAN:  Ms.. Feig is a pleasant 73 y.o. female with  Stage 0 right breast DCIS, ER+/PR+, diagnosed in 02/2022, treated with lumpectomy, adjuvant radiation therapy, and anti-estrogen therapy with Tamoxifen  beginning in 06/2022.    She is tolerating tamoxifen  well except for hot flashes, intermittent mood swings and lower extremity cramps.  She is willing to try it for a bit longer.  We have also discussed about alternate options for antiestrogen therapy  with aromatase inhibitors including anastrozole, letrozole , exemestane  No concerns on physical exam.  Most recent mammogram done at [facility] with no concerns, repeat mammogram due in September 2025, this has been ordered.  In basket message sent to fax her order to [facility] for September of next year. If she is unable to tolerate tamoxifen , she will give us  a call.  We briefly discussed about the low-dose tamoxifen  in the TAM 01 study which has shown very good results.  She had excellent questions about why tamoxifen  increases risk of endometrial cancer and have gone over this.  Thank you for consulting us  in the care of this patient.  Please do not hesitate to contact us  with any additional questions or concerns  *Total Encounter Time as defined by the Centers for Medicare and Medicaid Services includes, in addition to the face-to-face time of a patient visit (documented in the note above) non-face-to-face time: obtaining and reviewing outside history, ordering and reviewing medications, tests or procedures, care coordination (communications with other health care professionals or caregivers) and documentation in the medical record.

## 2023-08-10 NOTE — Assessment & Plan Note (Signed)
 DCIS (Ductal Carcinoma In Situ) Patient experiencing side effects from Tamoxifen  including hot flashes, night sweats, and muscle cramping. Elevated liver enzymes possibly related to Tamoxifen  use. Patient has decided to discontinue Tamoxifen  due to side effects and potential liver impact. -Discontinue Tamoxifen . -Consider alternative treatment options such as Anastrozole or Letrozole  if patient decides to resume medication. -Monitor liver enzymes in 2-3 weeks and again in 1-2 months to assess for normalization after discontinuation of Tamoxifen . - We calculated her risks without anti estrogen therapy based on [facility] nomogram. She still thinks, the overall risk is low and she would rather not take any more medication at this time.  Hyperlipidemia Patient discontinued cholesterol medication for a month, resulting in elevated cholesterol levels. Medication has been resumed. -Continue current cholesterol medication.  Breast Cancer Surveillance Patient had a mammogram in September and is scheduled for another in September 2025. -Continue with scheduled mammogram. -Provider breast exam every 6 months, alternating with primary care. -Patient to perform self-breast exam monthly and report any changes.  Follow-up Plan to see patient in late September 2025 after next mammogram.

## 2023-08-24 ENCOUNTER — Encounter: Payer: Self-pay | Admitting: Family Medicine

## 2023-08-24 ENCOUNTER — Other Ambulatory Visit (INDEPENDENT_AMBULATORY_CARE_PROVIDER_SITE_OTHER): Payer: PPO

## 2023-08-24 DIAGNOSIS — R7401 Elevation of levels of liver transaminase levels: Secondary | ICD-10-CM

## 2023-08-24 LAB — HEPATIC FUNCTION PANEL
ALT: 51 U/L — ABNORMAL HIGH (ref 0–35)
AST: 41 U/L — ABNORMAL HIGH (ref 0–37)
Albumin: 4.2 g/dL (ref 3.5–5.2)
Alkaline Phosphatase: 57 U/L (ref 39–117)
Bilirubin, Direct: 0.1 mg/dL (ref 0.0–0.3)
Total Bilirubin: 0.4 mg/dL (ref 0.2–1.2)
Total Protein: 6.7 g/dL (ref 6.0–8.3)

## 2023-09-05 ENCOUNTER — Other Ambulatory Visit: Payer: Self-pay | Admitting: Hematology and Oncology

## 2023-09-13 ENCOUNTER — Other Ambulatory Visit: Payer: Self-pay | Admitting: Family Medicine

## 2023-09-21 ENCOUNTER — Other Ambulatory Visit: Payer: Self-pay | Admitting: Family Medicine

## 2023-09-21 NOTE — Telephone Encounter (Signed)
 LAST APPOINTMENT DATE: f/u 08/04/23    NEXT APPOINTMENT DATE: none scheduled     LAST REFILL:07/15/23 #30 tabs/ 0 refills

## 2023-09-22 ENCOUNTER — Ambulatory Visit: Payer: PPO | Admitting: Licensed Clinical Social Worker

## 2023-10-06 ENCOUNTER — Ambulatory Visit: Admitting: Licensed Clinical Social Worker

## 2023-10-06 DIAGNOSIS — F4323 Adjustment disorder with mixed anxiety and depressed mood: Secondary | ICD-10-CM | POA: Diagnosis not present
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
## 2023-10-19 ENCOUNTER — Ambulatory Visit (INDEPENDENT_AMBULATORY_CARE_PROVIDER_SITE_OTHER): Admitting: Licensed Clinical Social Worker

## 2023-10-19 DIAGNOSIS — F4323 Adjustment disorder with mixed anxiety and depressed mood: Secondary | ICD-10-CM

## 2023-10-19 NOTE — Progress Notes (Signed)
 [facility] Counselor/Therapist Progress Note  Patient ID: Taylor Ewing, MRN: [id number]    Date: 10/19/23  Time Spent: 10:09  am - 11:08 am : 59 Minutes  Treatment Type: Individual Therapy.  Reported Symptoms: feels like she is in a state of agitation all the time, has to have distraction to avoid what is going on in her head, takes meds for sleep, obsessive intrusive thoughts  Mental Status Exam: Appearance:  Well Groomed     Behavior: Sharing  Motor: Normal  Speech/Language:  Normal Rate  Affect: Appropriate  Mood: normal  Thought process: normal  Thought content:   WNL  Sensory/Perceptual disturbances:   WNL  Orientation: oriented to person, place, and time/date  Attention: Good  Concentration: Good  Memory: WNL  Fund of knowledge:  Good  Insight:   Good  Judgment:  Good  Impulse Control: Good   Risk Assessment: Danger to Self:  No Self-injurious Behavior: No Danger to Others: No Duty to Warn:no Physical Aggression / Violence:No  Access to Firearms a concern: No  Gang Involvement:No   Subjective:   Taylor Ewing participated in the session, in person in the office with the therapist, and consented to treatment Taylor Ewing reviewed the events of the past week. Feels like there is a grief component daily-good things and bad things happen and then sets her into a spiral frame of mind.  Feeling like attending to self and speaking of self too much is self indulgent.        We reviewed numerous treatment approaches including CBT and Solution focused therapy. Psych-education regarding the Taylor Ewing's diagnosis of No diagnosis found. was provided during the session. We discussed Taylor Ewing's goals treatment goals which include learning triggers for sadness and anxious thoughts. Taylor Ewing provided verbal approval of the treatment plan.    Interventions: Psycho-education & Goal Setting.   Diagnosis:   Adj with mixed anxiety and  depression  Psychiatric Treatment: No , N/A  Treatment Plan:  Client Abilities/Strengths Taylor Ewing is open and receptive to the therapeutic process.    Support System: Family and friends   Client Treatment Preferences In person   Client Statement of Needs Taylor Ewing would like to learn how to gain insight on shutting down obsessive thoughts.     Treatment Level Weekly  Symptoms  Worry   (Status: maintained)Fear   (Status: maintained)  Goals:   Taylor Ewing agreed to setting goal of evoking joy in life verus the fear and anxiety she has developed.  Learning what triggers so much sadness for what was in her past and acceptance of her current situation.  Connecting with self since mother was not nurturing.  Questioning faith and walk currently as well.    Treatment plan signed and available on s-drive:  Yes    Target Date: 11/30/23 Frequency: Weekly  Progress: 0 Modality: individual    Therapist will provide referrals for additional resources as appropriate.  Therapist will provide psycho-education regarding Taylor Ewing's diagnosis and corresponding treatment approaches and interventions. Licensed Clinical Mental Health Counselor, Taylor Ewing, [facility], will support the patient's ability to achieve the goals identified. will employ CBT, BA, Problem-solving, Solution Focused, Mindfulness,  coping skills, & other evidenced-based practices will be used to promote progress towards healthy functioning to help manage decrease symptoms associated with her diagnosis.   Reduce overall level, frequency, and intensity of the feelings of depression, anxiety and panic evidenced by decreased  from 6 to 7 days/week to 0 to 1 days/week per client  report for at least 3 consecutive months. Verbally express understanding of the relationship between feelings of depression, anxiety and their impact on thinking patterns and behaviors. Verbalize an understanding of the role that distorted thinking plays in creating  fears, excessive worry, and ruminations.  Taylor Ewing participated in the creation of the treatment plan)    Taylor Ewing, LCMHC

## 2023-10-27 ENCOUNTER — Other Ambulatory Visit: Payer: Self-pay | Admitting: Family Medicine

## 2023-11-02 ENCOUNTER — Ambulatory Visit (INDEPENDENT_AMBULATORY_CARE_PROVIDER_SITE_OTHER): Admitting: Licensed Clinical Social Worker

## 2023-11-02 DIAGNOSIS — F4323 Adjustment disorder with mixed anxiety and depressed mood: Secondary | ICD-10-CM | POA: Diagnosis not present

## 2023-11-02 NOTE — Progress Notes (Signed)
 [facility] Counselor/Therapist Progress Note  Patient ID: Taylor Ewing, MRN: [id number]    Date: 11/02/23  Time Spent: 10:03  am - 10:59 am : 56 Minutes  Treatment Type: Individual Therapy.  Reported Symptoms: hopeful about the state of the world, but feeling the heavy weight of current news events, questioning faith, feeling of lack of control  Mental Status Exam: Appearance:  Well Groomed     Behavior: Sharing  Motor: Normal  Speech/Language:  Normal Rate  Affect: Appropriate  Mood: anxious  Thought process: normal  Thought content:   WNL  Sensory/Perceptual disturbances:   WNL  Orientation: oriented to person, place, and time/date  Attention: Good  Concentration: Good  Memory: WNL  Fund of knowledge:  Good  Insight:   Good  Judgment:  Good  Impulse Control: Good   Risk Assessment: Danger to Self:  No Self-injurious Behavior: No Danger to Others: No Duty to Warn:no Physical Aggression / Violence:No  Access to Firearms a concern: No  Gang Involvement:No   Subjective:   Taylor Ewing participated in the session, in person in the office with the therapist, and consented to treatment. Taylor Ewing reviewed the events of the past week.      Interventions: Cognitive Behavioral Therapy, Mindfulness Meditation, and Insight-Oriented  Diagnosis:   Adjustment disorder with mixed anxiety and depressed mood   Psychiatric Treatment: No , N/A  Treatment Plan:  Client Abilities/Strengths Taylor Ewing is open and receptive to sessions.    Support System: Family and Friends  Hospital doctor Preferences In person   Client Statement of Needs Taylor Ewing would like to begin to heal her mother wound by strengthening her relationship with her daughter and also increasing her faith.    Treatment Level Biweekly  Symptoms  Hopeful   (Status: improved) Thankful   (Status: maintained)  Goals:   Taylor Ewing experiences symptoms of pain and physical discomfort physically  but utilizing her walking as a stress reliever. Focus on mindfully connecting with self and events.  Lack of validation from mother.  Gratitude and mother wound healing activity with daughter.  F/U next session.     Target Date: 11/30/23 Frequency: Bi-Weekly  Progress: 0 Modality: individual    Therapist will provide referrals for additional resources as appropriate.  Therapist will provide psycho-education regarding Taylor Ewing's diagnosis and corresponding treatment approaches and interventions. Licensed Clinical Mental Health Counselor, [name], [facility] will support the patient's ability to achieve the goals identified. will employ CBT, BA, Problem-solving, Solution Focused, Mindfulness,  coping skills, & other evidenced-based practices will be used to promote progress towards healthy functioning to help manage decrease symptoms associated with her diagnosis.   Reduce overall level, frequency, and intensity of the feelings of depression, anxiety and panic evidenced by decreased feeling of anxiety with more self aware and responsive to her own feelings.   Verbally express understanding of the relationship between feelings of depression, anxiety and their impact on thinking patterns and behaviors. Verbalize an understanding of the role that distorted thinking plays in creating fears, excessive worry, and ruminations.  Taylor Ewing participated in the creation of the treatment plan)      Autrey Human, LCMHC

## 2023-11-16 ENCOUNTER — Ambulatory Visit (INDEPENDENT_AMBULATORY_CARE_PROVIDER_SITE_OTHER): Admitting: Licensed Clinical Social Worker

## 2023-11-16 DIAGNOSIS — F4323 Adjustment disorder with mixed anxiety and depressed mood: Secondary | ICD-10-CM

## 2023-11-16 NOTE — Progress Notes (Signed)
 [facility] Counselor/Therapist Progress Note  Patient ID: Taylor Ewing, MRN: [id number]    Date: 11/16/23  Time Spent: 11:02  am - 12:00 pm : 58 Minutes  Treatment Type: Individual Therapy.  Reported Symptoms: a bit nervous with pending dental issue on Thursday, a bit agitated upon waking   Mental Status Exam: Appearance:  Well Groomed     Behavior: Sharing  Motor: Normal  Speech/Language:  Normal Rate  Affect: Appropriate  Mood: normal  Thought process: normal  Thought content:   WNL  Sensory/Perceptual disturbances:   WNL  Orientation: oriented to person, place, and time/date  Attention: Good  Concentration: Good  Memory: WNL  Fund of knowledge:  Good  Insight:   Good  Judgment:  Good  Impulse Control: Good   Risk Assessment: Danger to Self:  No Self-injurious Behavior: No Danger to Others: No Duty to Warn:no Physical Aggression / Violence:No  Access to Firearms a concern: No  Gang Involvement:No   Subjective:   Taylor Ewing participated in the session, in person in the office with the therapist, and consented to treatment. Taylor Ewing reviewed the events of the past week.      Interventions: Cognitive Behavioral Therapy and Insight-Oriented  Diagnosis:   Adj with mixed depressed mood and anxiety   Psychiatric Treatment: No , N/A  Treatment Plan:  Client Abilities/Strengths Taylor Ewing is open and receptive to sessions.    Support System: Family and Friends  Hospital doctor Preferences In person   Client Statement of Needs Taylor Ewing would like to focus on moving past avoidant behaviors to move into acceptance.     Treatment Level Biweekly  Symptoms  Agitated   (Status: declined) Fear   (Status: maintained)  Goals:   Taylor Ewing experiences symptoms of feeling fearful of changes but open to change and accepting some of the things of the past.  Didn't see her girls for mothers day but is trying to move into understanding her levels of  guilt.  What emotion have I been avoiding and why? Radical acceptance -I have dealt with difficulties before and I can deal with this.     Target Date: 11/30/23 Frequency: Biweekly  Progress: 0 Modality: individual    Therapist will provide referrals for additional resources as appropriate.  Therapist will provide psycho-education regarding Taylor Ewing's diagnosis and corresponding treatment approaches and interventions. Licensed Clinical Mental Health Counselor, [name], [facility] will support the patient's ability to achieve the goals identified. will employ CBT, BA, Problem-solving, Solution Focused, Mindfulness,  coping skills, & other evidenced-based practices will be used to promote progress towards healthy functioning to help manage decrease symptoms associated with her diagnosis.   Reduce overall level, frequency, and intensity of the feelings of depression, anxiety and panic evidenced by self awareness and acceptance of the various versions of herself to aid in healing.   Verbally express understanding of the relationship between feelings of depression, anxiety and their impact on thinking patterns and behaviors. Verbalize an understanding of the role that distorted thinking plays in creating fears, excessive worry, and ruminations.  Taylor Ewing participated in the creation of the treatment plan)   Ajaya Crutchfield, LCMHC

## 2023-11-30 ENCOUNTER — Ambulatory Visit (INDEPENDENT_AMBULATORY_CARE_PROVIDER_SITE_OTHER): Admitting: Licensed Clinical Social Worker

## 2023-11-30 DIAGNOSIS — F4323 Adjustment disorder with mixed anxiety and depressed mood: Secondary | ICD-10-CM | POA: Diagnosis not present

## 2023-11-30 NOTE — Progress Notes (Signed)
  [name], [facility] Counselor/Therapist Progress Note  Patient ID: STARLINA Ewing, MRN: [id number]    Date: 11/30/23  Time Spent: 9:01  am - 10:00 am : 59 Minutes  Treatment Type: Individual Therapy.  Reported Symptoms: feeling on edge about fathers declining health  Mental Status Exam: Appearance:  Well Groomed     Behavior: Sharing  Motor: Normal  Speech/Language:  Normal Rate  Affect: Appropriate  Mood: anxious  Thought process: goal directed  Thought content:   WNL  Sensory/Perceptual disturbances:   WNL  Orientation: oriented to person, place, and time/date  Attention: Good  Concentration: Good  Memory: WNL  Fund of knowledge:  Good  Insight:   Good  Judgment:  Good  Impulse Control: Good   Risk Assessment: Danger to Self:  No Self-injurious Behavior: No Danger to Others: No Duty to Warn:no Physical Aggression / Violence:No  Access to Firearms a concern: No  Gang Involvement:No   Subjective:   Taylor Ewing participated in the session, in person in the office with the therapist, and consented to treatment. Taylor Ewing reviewed the events of the past week. Dad took a fall and mom has been more concerned about him declining.  She is resistant to discuss his final plans.  Happy she is close by to manage things.     Interventions: Mindfulness Meditation, Solution-Oriented/Positive Psychology, and Insight-Oriented  Diagnosis:   Adjustment disorder with mixed anxiety and depressed mood  Psychiatric Treatment: No , N/A  Treatment Plan:  Client Abilities/Strengths Taylor Ewing is open to sessions.   Support System: Family and Friends   Hospital doctor Preferences In person   Client Statement of Needs Taylor Ewing would like to be able to focus on prayer for strength to cope and manage.     Treatment Level Biweekly  Symptoms  Antsy   (Status: declined) Comparing/Ruminating   (Status: maintained)  Goals:   Taylor Ewing experiences  symptoms of doom reading about mental health and sadness in an effort to feel seen/heard.  Finding comfort in doom but more mindful of the joy.  Will focus on looking into new poetry by Rumi, Fasting    Target Date: 11/30/23 Frequency: Biweekly  Progress: 75% Modality: individual    Therapist will provide referrals for additional resources as appropriate.  Therapist will provide psycho-education regarding Burgandy's diagnosis and corresponding treatment approaches and interventions. Licensed Clinical Mental Health Counselor, [name], [facility] will support the patient's ability to achieve the goals identified. will employ CBT, BA, Problem-solving, Solution Focused, Mindfulness,  coping skills, & other evidenced-based practices will be used to promote progress towards healthy functioning to help manage decrease symptoms associated with her diagnosis.   Reduce overall level, frequency, and intensity of the feelings of depression, anxiety and panic evidenced by decreased  from 6 to 7 days/week to 0 to 1 days/week per client report for at least 3 consecutive months. Verbally express understanding of the relationship between feelings of depression, anxiety and their impact on thinking patterns and behaviors. Verbalize an understanding of the role that distorted thinking plays in creating fears, excessive worry, and ruminations.  Taylor Ewing participated in the creation of the treatment plan)   Kodey Xue, LCMHC

## 2023-12-02 ENCOUNTER — Other Ambulatory Visit: Payer: Self-pay | Admitting: Family Medicine

## 2023-12-03 ENCOUNTER — Other Ambulatory Visit: Payer: Self-pay | Admitting: Family Medicine

## 2023-12-03 DIAGNOSIS — E7800 Pure hypercholesterolemia, unspecified: Secondary | ICD-10-CM

## 2023-12-03 DIAGNOSIS — R7401 Elevation of levels of liver transaminase levels: Secondary | ICD-10-CM

## 2023-12-03 NOTE — Telephone Encounter (Signed)
 Refilled times one  Needs fasting lab for liver and hepatic profile when able T[facility]  I am out of town

## 2023-12-03 NOTE — Telephone Encounter (Signed)
 Labs in Jan indicate pt wasn't taking med and then pt f/u with PCP on 08/04/23 to discuss labs/ med. I can't tell by notes if pt was suppose to restart med or not. Please advise

## 2023-12-03 NOTE — Telephone Encounter (Signed)
 Name of Medication: Ambien   Name of Pharmacy: [facility] [address] Last Fill or Written Date and Quantity: 09/21/23 #30 tab/ 0 refills  Last Office Visit and Type: f/u 08/04/23 Next Office Visit and Type: none scheduled

## 2023-12-06 NOTE — Telephone Encounter (Signed)
 Spoke to pt, scheduled labs for 12/10/23

## 2023-12-10 ENCOUNTER — Other Ambulatory Visit

## 2023-12-10 DIAGNOSIS — E7800 Pure hypercholesterolemia, unspecified: Secondary | ICD-10-CM | POA: Diagnosis not present

## 2023-12-10 DIAGNOSIS — R7401 Elevation of levels of liver transaminase levels: Secondary | ICD-10-CM | POA: Diagnosis not present

## 2023-12-10 LAB — HEPATIC FUNCTION PANEL
ALT: 61 U/L — ABNORMAL HIGH (ref 0–35)
AST: 46 U/L — ABNORMAL HIGH (ref 0–37)
Albumin: 4.7 g/dL (ref 3.5–5.2)
Alkaline Phosphatase: 72 U/L (ref 39–117)
Bilirubin, Direct: 0.1 mg/dL (ref 0.0–0.3)
Total Bilirubin: 0.4 mg/dL (ref 0.2–1.2)
Total Protein: 7.4 g/dL (ref 6.0–8.3)

## 2023-12-10 LAB — LIPID PANEL
Cholesterol: 152 mg/dL (ref 0–200)
HDL: 39.7 mg/dL (ref >39.00)
LDL Cholesterol: 76 mg/dL (ref 0–99)
NonHDL: 111.88
Total CHOL/HDL Ratio: 4
Triglycerides: 180 mg/dL — ABNORMAL HIGH (ref 0.0–149.0)
VLDL: 36 mg/dL (ref 0.0–40.0)

## 2023-12-12 ENCOUNTER — Ambulatory Visit: Payer: Self-pay | Admitting: Family Medicine

## 2023-12-12 DIAGNOSIS — K760 Fatty (change of) liver, not elsewhere classified: Secondary | ICD-10-CM

## 2023-12-14 ENCOUNTER — Telehealth: Payer: Self-pay | Admitting: *Deleted

## 2023-12-14 NOTE — Telephone Encounter (Signed)
 Called pt and schedule appt for labs

## 2023-12-14 NOTE — Telephone Encounter (Signed)
 Per Dr. Malissa Se:  re check LFTs in about 4-6 weeks   Please schedule non fasting labs in 4-6 weeks, pt aware due to lab results she viewed on mychart

## 2023-12-28 ENCOUNTER — Ambulatory Visit: Admitting: Licensed Clinical Social Worker

## 2024-01-25 ENCOUNTER — Ambulatory Visit: Payer: Self-pay | Admitting: Family Medicine

## 2024-01-25 ENCOUNTER — Other Ambulatory Visit (INDEPENDENT_AMBULATORY_CARE_PROVIDER_SITE_OTHER)

## 2024-01-25 DIAGNOSIS — K760 Fatty (change of) liver, not elsewhere classified: Secondary | ICD-10-CM

## 2024-01-25 LAB — HEPATIC FUNCTION PANEL
ALT: 40 U/L — ABNORMAL HIGH (ref 0–35)
AST: 33 U/L (ref 0–37)
Albumin: 4.6 g/dL (ref 3.5–5.2)
Alkaline Phosphatase: 77 U/L (ref 39–117)
Bilirubin, Direct: 0.1 mg/dL (ref 0.0–0.3)
Total Bilirubin: 0.5 mg/dL (ref 0.2–1.2)
Total Protein: 7.1 g/dL (ref 6.0–8.3)

## 2024-02-02 ENCOUNTER — Other Ambulatory Visit: Payer: Self-pay | Admitting: Family Medicine

## 2024-02-03 NOTE — Telephone Encounter (Signed)
 Name of Medication: Ambien   Name of Pharmacy: [facility] [address] Last Fill or Written Date and Quantity: 12/03/23 #30 tab/ 0 refills  Last Office Visit and Type: f/u 08/04/23 Next Office Visit and Type: CPE 03/22/24

## 2024-03-03 ENCOUNTER — Other Ambulatory Visit: Payer: Self-pay | Admitting: Family Medicine

## 2024-03-04 ENCOUNTER — Other Ambulatory Visit: Payer: Self-pay | Admitting: Family Medicine

## 2024-03-10 DIAGNOSIS — R928 Other abnormal and inconclusive findings on diagnostic imaging of breast: Secondary | ICD-10-CM | POA: Diagnosis not present

## 2024-03-10 LAB — HM MAMMOGRAPHY

## 2024-03-13 ENCOUNTER — Ambulatory Visit: Payer: PPO | Admitting: Hematology and Oncology

## 2024-03-13 ENCOUNTER — Encounter: Payer: Self-pay | Admitting: Family Medicine

## 2024-03-14 ENCOUNTER — Telehealth: Payer: Self-pay | Admitting: Family Medicine

## 2024-03-14 ENCOUNTER — Inpatient Hospital Stay: Payer: PPO | Attending: Hematology and Oncology | Admitting: Hematology and Oncology

## 2024-03-14 VITALS — BP 108/64 | HR 73 | Temp 98.0°F | Resp 18 | Wt 145.5 lb

## 2024-03-14 DIAGNOSIS — E7800 Pure hypercholesterolemia, unspecified: Secondary | ICD-10-CM

## 2024-03-14 DIAGNOSIS — M8580 Other specified disorders of bone density and structure, unspecified site: Secondary | ICD-10-CM | POA: Insufficient documentation

## 2024-03-14 DIAGNOSIS — Z923 Personal history of irradiation: Secondary | ICD-10-CM | POA: Diagnosis not present

## 2024-03-14 DIAGNOSIS — Z801 Family history of malignant neoplasm of trachea, bronchus and lung: Secondary | ICD-10-CM | POA: Diagnosis not present

## 2024-03-14 DIAGNOSIS — Z803 Family history of malignant neoplasm of breast: Secondary | ICD-10-CM | POA: Insufficient documentation

## 2024-03-14 DIAGNOSIS — Z800 Family history of malignant neoplasm of digestive organs: Secondary | ICD-10-CM | POA: Insufficient documentation

## 2024-03-14 DIAGNOSIS — I10 Essential (primary) hypertension: Secondary | ICD-10-CM

## 2024-03-14 DIAGNOSIS — Z170 Estrogen receptor positive status [ER+]: Secondary | ICD-10-CM | POA: Diagnosis not present

## 2024-03-14 DIAGNOSIS — D0511 Intraductal carcinoma in situ of right breast: Secondary | ICD-10-CM | POA: Diagnosis not present

## 2024-03-14 DIAGNOSIS — R7303 Prediabetes: Secondary | ICD-10-CM

## 2024-03-14 DIAGNOSIS — Z85828 Personal history of other malignant neoplasm of skin: Secondary | ICD-10-CM | POA: Diagnosis not present

## 2024-03-14 MED ORDER — LETROZOLE 2.5 MG PO TABS: 2.5000 mg | ORAL_TABLET | Freq: Every day | ORAL | 0 refills | Status: DC

## 2024-03-14 NOTE — Telephone Encounter (Signed)
-----   Message from Veva JINNY Ferrari sent at 02/28/2024  3:35 PM EDT ----- Regarding: Lab orders for Wed, 9.10.25 Patient is scheduled for CPX labs, please order future labs, Thanks , Terri  Has wellness appt with the nurse, no cpx scheduled

## 2024-03-14 NOTE — Progress Notes (Signed)
 BRIEF ONCOLOGIC HISTORY:  Oncology History  Ductal carcinoma in situ (DCIS) of right breast  02/23/2022 Mammogram   3D screening mammogram showed indeterminate right breast calcifications.   03/12/2022 Pathology Results   Pathology from the right breast lumpectomy showed intermediate grade DCIS, negative for invasive carcinoma.  Prognostic showed ER 95% positive strong staining PR 95% positive strong staining   03/20/2022 Initial Diagnosis   Ductal carcinoma in situ (DCIS) of right breast   04/01/2022 Definitive Surgery   She had right breast lumpectomy on September 27 which once again showed a 38 mm measuring DCIS, intermediate grade, foci of atypical ductal epithelium seen at the anterior margin and close proximity to DCIS.  Questionable positive margin.    Genetic Testing   [facility] CustomNext+RNA was Negative. Report date is 05/27/2022.  The CustomNext gene panel offered by [employer] includes sequencing, rearrangement analysis, and RNA analysis for the following 38 genes:  APC, ATM, AXIN2, BARD1, BMPR1A, BRCA1, BRCA2, BRIP1, CDH1, CDK4, CDKN2A, CHEK2, DICER1, HOXB13, EPCAM, GREM1, MLH1, MSH2, MSH3, MSH6, MUTYH, NBN, NF1, NTHL1, PALB2, PMS2, POLD1, POLE, PRKAR1A, PTEN, RAD51C, RAD51D, RECQL, RET, SMAD4, SMARCA4, STK11, and TP53.    05/11/2022 - 06/09/2022 Radiation Therapy   Right breast:  40.05 Gy in 15 treatments "Boost": 10 Gy in 5 treatments    06/2022 -  Anti-estrogen oral therapy   Tamoxifen    09/21/2022 Cancer Staging   Staging form: Breast, AJCC 8th Edition - Pathologic: Stage 0 (pTis (DCIS), pN0, cM0) - Signed by Crawford Morna Pickle, NP on 09/21/2022 Stage prefix: Initial diagnosis     INTERVAL HISTORY  Discussed the use of AI scribe software for clinical note transcription with the patient, who gave verbal consent to proceed.  History of Present Illness Taylor Ewing is a 73 year old female with early stage breast cancer who presents for follow-up regarding her  treatment plan.  She has a history of ductal carcinoma in situ (DCIS) treated with surgery and radiation. She was on tamoxifen  but discontinued it in February due to side effects such as hot flashes, night sweats, and leg cramps, which have since resolved.  Her recent mammogram was reported as good. She has mild osteopenia noted in a bone density scan from 2023.  She has not traveled recently due to responsibilities related to her mother's care.    REVIEW OF SYSTEMS:  Review of Systems  Constitutional:  Negative for appetite change, chills, fatigue, fever and unexpected weight change.  HENT:   Negative for hearing loss, lump/mass and trouble swallowing.   Eyes:  Negative for eye problems and icterus.  Respiratory:  Negative for chest tightness, cough and shortness of breath.   Cardiovascular:  Negative for chest pain, leg swelling and palpitations.  Gastrointestinal:  Negative for abdominal distention, abdominal pain, constipation, diarrhea, nausea and vomiting.  Endocrine: Positive for hot flashes.  Genitourinary:  Negative for difficulty urinating.   Musculoskeletal:  Positive for arthralgias.  Skin:  Negative for itching and rash.  Neurological:  Negative for dizziness, extremity weakness, headaches and numbness.  Hematological:  Negative for adenopathy. Does not bruise/bleed easily.  Psychiatric/Behavioral:  Negative for depression. The patient is not nervous/anxious.    Breast: Denies any new nodularity, masses, tenderness, nipple changes, or nipple discharge.        PAST MEDICAL/SURGICAL HISTORY:  Past Medical History:  Diagnosis Date   Allergy    Anxiety    Asthma    Breast cancer (HCC)    right breast   Cancer (  HCC)    skin, basal cell   Essential hypertension    History of kidney stones    Insomnia    Migraines    very rarely now, more prior to menopause   TMJ (dislocation of temporomandibular joint)    Past Surgical History:  Procedure Laterality Date    BREAST BIOPSY  2023   BREAST LUMPECTOMY WITH RADIOACTIVE SEED LOCALIZATION Right 04/01/2022   Procedure: RIGHT BREAST LUMPECTOMY WITH RADIOACTIVE SEED LOCALIZATION;  Surgeon: Curvin Deward MOULD, MD;  Location: [facility];  Service: General;  Laterality: Right;   HAND SURGERY Left 2021   schwannoma removal   HEMORRHOID SURGERY  11/04/2003   RE-EXCISION OF BREAST CANCER,SUPERIOR MARGINS Right 04/14/2022   Procedure: RE-EXCISION OF RIGHT BREAST ANTERIOR MARGIN;  Surgeon: Curvin Deward III, MD;  Location: [facility] OR;  Service: General;  Laterality: Right;   SKIN LESION EXCISION     TUBAL LIGATION       ALLERGIES:  No Known Allergies   CURRENT MEDICATIONS:  Outpatient Encounter Medications as of 03/14/2024  Medication Sig   albuterol  (PROVENTIL  HFA;VENTOLIN  HFA) 108 (90 Base) MCG/ACT inhaler INHALE TWO PUFFS BY MOUTH EVERY 4 HOURS AS NEEDED FOR  WHEEZE   cetirizine (ZYRTEC) 10 MG tablet Take 1 tablet by mouth daily as needed (allergies).   chlorthalidone  (HYGROTON ) 25 MG tablet TAKE 1 TABLET BY MOUTH ONCE DAILY IN THE MORNING   famotidine  (PEPCID ) 20 MG tablet TAKE 1 TABLET BY MOUTH AT BEDTIME   losartan  (COZAAR ) 100 MG tablet Take 1 tablet by mouth once daily   Multiple Vitamin (MULTIVITAMIN) tablet Take 1 tablet by mouth daily.   Omega-3 Fatty Acids (FISH OIL PO) Take 1,400 mg by mouth daily.   sennosides-docusate sodium (SENOKOT-S) 8.6-50 MG tablet Take 1 tablet by mouth daily as needed for constipation.   sertraline  (ZOLOFT ) 50 MG tablet Take 1 tablet by mouth once daily   simvastatin  (ZOCOR ) 40 MG tablet Take 1 tablet by mouth once daily   tamoxifen  (NOLVADEX ) 20 MG tablet Take 1 tablet by mouth once daily   zolpidem  (AMBIEN ) 10 MG tablet TAKE 1 TABLET BY MOUTH AT BEDTIME AS NEEDED FOR SLEEP   No facility-administered encounter medications on file as of 03/14/2024.     ONCOLOGIC FAMILY HISTORY:  Family History  Problem Relation Age of Onset   Depression Mother    Breast cancer  Mother 53       negative genetic testing   Depression Father    Hyperlipidemia Sister    Thyroid  cancer Sister 89   Hyperlipidemia Brother    Lung cancer Maternal Aunt    Breast cancer Maternal Grandmother    Heart attack Maternal Grandmother    Stomach cancer Paternal Grandfather 40   Nephrolithiasis Daughter    Colon polyps Daughter    Breast cancer Cousin 34       maternal first cousin     SOCIAL HISTORY:  Social History   Socioeconomic History   Marital status: Divorced    Spouse name: Not on file   Number of children: 2   Years of education: Not on file   Highest education level: Bachelor's degree (e.g., BA, AB, BS)  Occupational History   Not on file  Tobacco Use   Smoking status: Never   Smokeless tobacco: Never  Vaping Use   Vaping status: Never Used  Substance and Sexual Activity   Alcohol use: Yes    Alcohol/week: 0.0 standard drinks of alcohol    Comment:  1-2 weekly   Drug use: No   Sexual activity: Yes  Other Topics Concern   Not on file  Social History Narrative   Not on file   Social Drivers of Health   Financial Resource Strain: Low Risk  (07/31/2023)   Overall Financial Resource Strain (CARDIA)    Difficulty of Paying Living Expenses: Not hard at all  Food Insecurity: No Food Insecurity (07/31/2023)   Hunger Vital Sign    Worried About Running Out of Food in the Last Year: Never true    Ran Out of Food in the Last Year: Never true  Transportation Needs: No Transportation Needs (07/31/2023)   PRAPARE - Administrator, Civil Service (Medical): No    Lack of Transportation (Non-Medical): No  Physical Activity: Sufficiently Active (07/31/2023)   Exercise Vital Sign    Days of Exercise per Week: 5 days    Minutes of Exercise per Session: 40 min  Stress: Stress Concern Present (07/31/2023)   [employer] of Occupational Health - Occupational Stress Questionnaire    Feeling of Stress : To some extent  Social Connections: Socially  Isolated (07/31/2023)   Social Connection and Isolation Panel    Frequency of Communication with Friends and Family: Three times a week    Frequency of Social Gatherings with Friends and Family: Once a week    Attends Religious Services: Never    Database administrator or Organizations: No    Attends Banker Meetings: Never    Marital Status: Divorced  Catering manager Violence: Not At Risk (03/17/2023)   Humiliation, Afraid, Rape, and Kick questionnaire    Fear of Current or Ex-Partner: No    Emotionally Abused: No    Physically Abused: No    Sexually Abused: No     OBSERVATIONS/OBJECTIVE:  There were no vitals taken for this visit.  GENERAL: Patient is a well appearing female in no acute distress Bilateral breasts examined. No palpable mass. No regional adenopathy   LABORATORY DATA:  None for this visit.  DIAGNOSTIC IMAGING:  None for this visit.      ASSESSMENT AND PLAN:  Ms.. Hams is a pleasant 73 y.o. female with Stage 0 right breast DCIS, ER+/PR+, diagnosed in 02/2022, treated with lumpectomy, adjuvant radiation therapy, and anti-estrogen therapy with Tamoxifen  beginning in 06/2022.    Assessment and Plan Assessment & Plan Ductal carcinoma in situ of breast, post-treatment DCIS surgically removed and treated with radiation. Tamoxifen  discontinued due to side effects. Recent mammogram favorable. Discussed recurrence risks and initiated letrozole  after discussing side effects. - Prescribe letrozole  for 3 months. - Send prescription to [facility] [address] - Schedule follow-up in three months to assess letrozole  tolerance and effectiveness. - Monitor bone density due to aromatase inhibitors.  Osteopenia Mild osteopenia noted. Letrozole  may affect bone density, requiring monitoring. - Repeat bone density scan next year.     Thank you for consulting us  in the care of this patient.  Please do not hesitate to contact us  with any additional questions or  concerns  *Total Encounter Time as defined by the Centers for Medicare and Medicaid Services includes, in addition to the face-to-face time of a patient visit (documented in the note above) non-face-to-face time: obtaining and reviewing outside history, ordering and reviewing medications, tests or procedures, care coordination (communications with other health care professionals or caregivers) and documentation in the medical record.

## 2024-03-15 ENCOUNTER — Ambulatory Visit: Payer: Self-pay | Admitting: Family Medicine

## 2024-03-15 ENCOUNTER — Other Ambulatory Visit (INDEPENDENT_AMBULATORY_CARE_PROVIDER_SITE_OTHER)

## 2024-03-15 DIAGNOSIS — E7800 Pure hypercholesterolemia, unspecified: Secondary | ICD-10-CM | POA: Diagnosis not present

## 2024-03-15 DIAGNOSIS — I10 Essential (primary) hypertension: Secondary | ICD-10-CM | POA: Diagnosis not present

## 2024-03-15 DIAGNOSIS — R7303 Prediabetes: Secondary | ICD-10-CM

## 2024-03-15 LAB — COMPREHENSIVE METABOLIC PANEL WITH GFR
ALT: 37 U/L — ABNORMAL HIGH (ref 0–35)
AST: 31 U/L (ref 0–37)
Albumin: 4.7 g/dL (ref 3.5–5.2)
Alkaline Phosphatase: 82 U/L (ref 39–117)
BUN: 17 mg/dL (ref 6–23)
CO2: 30 meq/L (ref 19–32)
Calcium: 9.9 mg/dL (ref 8.4–10.5)
Chloride: 101 meq/L (ref 96–112)
Creatinine, Ser: 0.88 mg/dL (ref 0.40–1.20)
GFR: 66.25 mL/min
Glucose, Bld: 106 mg/dL — ABNORMAL HIGH (ref 70–99)
Potassium: 3.5 meq/L (ref 3.5–5.1)
Sodium: 139 meq/L (ref 135–145)
Total Bilirubin: 0.5 mg/dL (ref 0.2–1.2)
Total Protein: 7.5 g/dL (ref 6.0–8.3)

## 2024-03-15 LAB — CBC WITH DIFFERENTIAL/PLATELET
Basophils Absolute: 0 K/uL (ref 0.0–0.1)
Basophils Relative: 0.6 % (ref 0.0–3.0)
Eosinophils Absolute: 0.1 K/uL (ref 0.0–0.7)
Eosinophils Relative: 2 % (ref 0.0–5.0)
HCT: 38.9 % (ref 36.0–46.0)
Hemoglobin: 13.3 g/dL (ref 12.0–15.0)
Lymphocytes Relative: 29.4 % (ref 12.0–46.0)
Lymphs Abs: 1.6 K/uL (ref 0.7–4.0)
MCHC: 34.1 g/dL (ref 30.0–36.0)
MCV: 88.5 fl (ref 78.0–100.0)
Monocytes Absolute: 0.6 K/uL (ref 0.1–1.0)
Monocytes Relative: 10.9 % (ref 3.0–12.0)
Neutro Abs: 3.1 K/uL (ref 1.4–7.7)
Neutrophils Relative %: 57.1 % (ref 43.0–77.0)
Platelets: 276 K/uL (ref 150.0–400.0)
RBC: 4.39 Mil/uL (ref 3.87–5.11)
RDW: 13 % (ref 11.5–15.5)
WBC: 5.4 K/uL (ref 4.0–10.5)

## 2024-03-15 LAB — LIPID PANEL
Cholesterol: 165 mg/dL (ref 0–200)
HDL: 37.8 mg/dL — ABNORMAL LOW (ref >39.00)
LDL Cholesterol: 79 mg/dL (ref 0–99)
NonHDL: 127.67
Total CHOL/HDL Ratio: 4
Triglycerides: 244 mg/dL — ABNORMAL HIGH (ref 0.0–149.0)
VLDL: 48.8 mg/dL — ABNORMAL HIGH (ref 0.0–40.0)

## 2024-03-15 LAB — TSH: TSH: 1.93 u[IU]/mL (ref 0.35–5.50)

## 2024-03-15 LAB — HEMOGLOBIN A1C: Hgb A1c MFr Bld: 6.2 % (ref 4.6–6.5)

## 2024-03-16 ENCOUNTER — Encounter: Payer: Self-pay | Admitting: Hematology and Oncology

## 2024-03-22 ENCOUNTER — Encounter: Payer: Self-pay | Admitting: Family Medicine

## 2024-03-22 ENCOUNTER — Ambulatory Visit: Admitting: Family Medicine

## 2024-03-22 VITALS — BP 122/64 | HR 71 | Temp 98.1°F | Ht 64.5 in | Wt 144.2 lb

## 2024-03-22 DIAGNOSIS — E2839 Other primary ovarian failure: Secondary | ICD-10-CM

## 2024-03-22 DIAGNOSIS — E7800 Pure hypercholesterolemia, unspecified: Secondary | ICD-10-CM | POA: Diagnosis not present

## 2024-03-22 DIAGNOSIS — C50311 Malignant neoplasm of lower-inner quadrant of right female breast: Secondary | ICD-10-CM | POA: Diagnosis not present

## 2024-03-22 DIAGNOSIS — R7401 Elevation of levels of liver transaminase levels: Secondary | ICD-10-CM

## 2024-03-22 DIAGNOSIS — Z23 Encounter for immunization: Secondary | ICD-10-CM

## 2024-03-22 DIAGNOSIS — I10 Essential (primary) hypertension: Secondary | ICD-10-CM

## 2024-03-22 DIAGNOSIS — F419 Anxiety disorder, unspecified: Secondary | ICD-10-CM

## 2024-03-22 DIAGNOSIS — Z0000 Encounter for general adult medical examination without abnormal findings: Secondary | ICD-10-CM | POA: Diagnosis not present

## 2024-03-22 DIAGNOSIS — M85859 Other specified disorders of bone density and structure, unspecified thigh: Secondary | ICD-10-CM | POA: Diagnosis not present

## 2024-03-22 DIAGNOSIS — R7303 Prediabetes: Secondary | ICD-10-CM

## 2024-03-22 DIAGNOSIS — K760 Fatty (change of) liver, not elsewhere classified: Secondary | ICD-10-CM | POA: Diagnosis not present

## 2024-03-22 DIAGNOSIS — Z170 Estrogen receptor positive status [ER+]: Secondary | ICD-10-CM | POA: Diagnosis not present

## 2024-03-22 MED ORDER — LOSARTAN POTASSIUM 100 MG PO TABS: 100.0000 mg | ORAL_TABLET | Freq: Every day | ORAL | 3 refills | Status: AC

## 2024-03-22 NOTE — Assessment & Plan Note (Signed)
 With fatty liver Better off tamoxifen   Lab Results  Component Value Date   ALT 37 (H) 03/15/2024   AST 31 03/15/2024   ALKPHOS 82 03/15/2024   BILITOT 0.5 03/15/2024

## 2024-03-22 NOTE — Assessment & Plan Note (Signed)
 Disc goals for lipids and reasons to control them Rev last labs with pt Rev low sat fat diet in detail Encouraged exercise for HDL LDL 79 Trig up -will watch sugar/carb intake  Enc her to continue fish oil and exercise   Simvastatin  40 mg daily

## 2024-03-22 NOTE — Assessment & Plan Note (Signed)
 Dexa ordered for dec

## 2024-03-22 NOTE — Assessment & Plan Note (Signed)
 Reviewed health habits including diet and exercise and skin cancer prevention Reviewed appropriate screening tests for age  Also reviewed health mt list, fam hx and immunization status , as well as social and family history   See HPI Labs reviewed and ordered Health Maintenance  Topic Date Due   Medicare Annual Wellness Visit  03/16/2024   COVID-19 Vaccine (8 - Pfizer risk 2024-25 season) 04/07/2026*   DTaP/Tdap/Td vaccine (3 - Td or Tdap) 08/13/2024   Breast Cancer Screening  03/10/2025   Colon Cancer Screening  07/04/2033   Pneumococcal Vaccine for age over 29  Completed   Flu Shot  Completed   DEXA scan (bone density measurement)  Completed   Hepatitis C Screening  Completed   Zoster (Shingles) Vaccine  Completed   HPV Vaccine  Aged Out   Meningitis B Vaccine  Aged Out  *Topic was postponed. The date shown is not the original due date.    Flu shot today  Will get covid shot at pharmacy  [facility] breast cancer care Dexa ordered for December Discussed fall prevention, supplements and exercise for bone density  PHQ 3  with treatment

## 2024-03-22 NOTE — Progress Notes (Signed)
 Subjective:    Patient ID: Taylor Ewing, female    DOB: [birth year], 73 y.o.   MRN: [id number]  HPI  Here for health maintenance exam and to review chronic medical problems   Wt Readings from Last 3 Encounters:  03/22/24 144 lb 4 oz (65.4 kg)  03/14/24 145 lb 8 oz (66 kg)  08/10/23 144 lb 1.6 oz (65.4 kg)   24.38 kg/m  Vitals:   03/22/24 0943  BP: 122/64  Pulse: 71  Temp: 98.1 F (36.7 C)  SpO2: 99%    Immunization History  Administered Date(s) Administered   Fluad Quad(high Dose 65+) 06/06/2019, 04/24/2022, 03/11/2023   INFLUENZA, HIGH DOSE SEASONAL PF 07/12/2018, 03/11/2023, 03/22/2024   Influenza Split 06/15/2012   Influenza,inj,Quad PF,6+ Mos 08/13/2014, 09/03/2015   Influenza-Unspecified 05/15/2021   Moderna Covid-19 Fall Seasonal Vaccine 71yrs & older 04/24/2022, 03/11/2023   PFIZER Comirnaty(Gray Top)Covid-19 Tri-Sucrose Vaccine 12/20/2020   PFIZER(Purple Top)SARS-COV-2 Vaccination 07/27/2019, 08/17/2019, 04/19/2020, 12/20/2020   Pfizer Covid-19 Vaccine Bivalent Booster 40yrs & up 04/24/2022   Pneumococcal Conjugate-13 06/06/2019   Pneumococcal Polysaccharide-23 11/13/2019   Respiratory Syncytial Virus Vaccine,Recomb Aduvanted(Arexvy) 04/24/2022   Td 07/30/2003   Tdap 08/13/2014   Unspecified SARS-COV-2 Vaccination 03/11/2023   Zoster Recombinant(Shingrix) 06/06/2019, 10/24/2019    Health Maintenance Due  Topic Date Due   Medicare Annual Wellness (AWV)  03/16/2024   Taking care of her parents  Less time for self care   Flu shot today   Mammogram- 03/2024  Self breast exam- no lumps / had surgical exam last week  History of breast cancer Letrozole  2.5 mg daily   did not tolerate tamoxifen  and it raised LFTs perhaps  Trial 3 months - 6 days in so far so good   Gyn health-no problems  Wants to do some strength training   Colon cancer screening  Colonoscopy 06/2023   Bone health  Dexa 06/2022  Osteopenia  On letrozole  currently  Falls-none   Fractures-none  Supplements mvi  Last vitamin D Lab Results  Component Value Date   VD25OH 36 03/22/2020    Exercise  Walks 4 days per week 45 minutes     Mood    03/22/2024    9:52 AM 03/14/2024    9:00 AM 08/04/2023    9:39 AM 06/08/2023   11:06 AM 03/17/2023   11:46 AM  Depression screen PHQ 2/9  Decreased Interest 1 1 1 1  0  Down, Depressed, Hopeless 1 1 1 1  0  PHQ - 2 Score 2 2 2 2  0  Altered sleeping 1 0 1 1 0  Tired, decreased energy 1 0 0 1 0  Change in appetite 1 0 1 1 0  Feeling bad or failure about yourself  0 0 0 0 0  Trouble concentrating 1 1 0 0 0  Moving slowly or fidgety/restless 0 0 0 0 0  Suicidal thoughts 0 0 0 0 0  PHQ-9 Score 6 3 4 5  0  Difficult doing work/chores Not difficult at all  Somewhat difficult Not difficult at all Not difficult at all      03/22/2024    9:53 AM 08/04/2023    9:40 AM 06/08/2023   11:06 AM 08/25/2022   10:25 AM  GAD 7 : Generalized Anxiety Score  Nervous, Anxious, on Edge 1 1 1 1   Control/stop worrying 1 1 1 1   Worry too much - different things 1 1 2 1   Trouble relaxing 1 1 2 1   Restless 1 1 1  0  Easily annoyed or irritable 1 2 2 1   Afraid - awful might happen 1 1 1 1   Total GAD 7 Score 7 8 10 6   Anxiety Difficulty Not difficult at all Somewhat difficult Not difficult at all Not difficult at all  Some improvement in anxiety  High stress   History of anxiety disorder   Zoloft  50 mg daily   HTN bp is stable today  No cp or palpitations or headaches or edema  No side effects to medicines  BP Readings from Last 3 Encounters:  03/22/24 122/64  03/14/24 108/64  08/10/23 120/71    Losartan  100 mg daily  Chlorthalidone  25 mg daily   Lab Results  Component Value Date   NA 139 03/15/2024   K 3.5 03/15/2024   CO2 30 03/15/2024   GLUCOSE 106 (H) 03/15/2024   BUN 17 03/15/2024   CREATININE 0.88 03/15/2024   CALCIUM 9.9 03/15/2024   GFR 66.25 03/15/2024   GFRNONAA >60 04/14/2022    Hyperlipidemia Lab Results   Component Value Date   CHOL 165 03/15/2024   CHOL 152 12/10/2023   CHOL 293 (H) 07/26/2023   Lab Results  Component Value Date   HDL 37.80 (L) 03/15/2024   HDL 39.70 12/10/2023   HDL 36.40 (L) 07/26/2023   Lab Results  Component Value Date   LDLCALC 79 03/15/2024   LDLCALC 76 12/10/2023   LDLCALC 189 (H) 07/26/2023   Lab Results  Component Value Date   TRIG 244.0 (H) 03/15/2024   TRIG 180.0 (H) 12/10/2023   TRIG 337.0 (H) 07/26/2023   Lab Results  Component Value Date   CHOLHDL 4 03/15/2024   CHOLHDL 4 12/10/2023   CHOLHDL 8 07/26/2023   Lab Results  Component Value Date   LDLDIRECT 105.0 08/18/2022   LDLDIRECT 89.0 07/15/2021   LDLDIRECT 71.0 11/29/2018   Zocor  40 mg daily   History of hepatic steatosis on US   Improved LFTs off tamoxifen   Lab Results  Component Value Date   ALT 37 (H) 03/15/2024   AST 31 03/15/2024   ALKPHOS 82 03/15/2024   BILITOT 0.5 03/15/2024   Fibrosis 4 Score = 1.31  Fib-4 interpretation is not validated for people under 35 or over 32 years of age. However, scores under 2.0 are generally considered low risk.   Prediabetes Lab Results  Component Value Date   HGBA1C 6.2 03/15/2024   HGBA1C 5.7 08/18/2022   HGBA1C 5.7 07/15/2021   Has been eating some more carbs / salty snacks   Lots of veggies   Lab Results  Component Value Date   TSH 1.93 03/15/2024   Lab Results  Component Value Date   WBC 5.4 03/15/2024   HGB 13.3 03/15/2024   HCT 38.9 03/15/2024   MCV 88.5 03/15/2024   PLT 276.0 03/15/2024         Patient Active Problem List   Diagnosis Date Noted   Hepatic steatosis 08/05/2023   Elevated liver transaminase level 06/22/2023   Internal hemorrhoids 06/08/2023   Blood in stool 06/08/2023   Malignant neoplasm of lower-inner quadrant of right breast of female, estrogen receptor positive (HCC) 09/08/2022   Genetic testing 06/01/2022   Family history of breast cancer 05/19/2022   Ductal carcinoma in situ  (DCIS) of right breast 03/20/2022   Abnormal ultrasound of endometrium 04/09/2020   Arthritis of right hip 03/27/2020   Fatigue 03/22/2020   Osteopenia 02/20/2020   Estrogen deficiency 01/19/2020   Ganglion cyst 11/13/2019   Prediabetes  08/13/2014   Routine general medical examination at a health care facility 06/15/2012   Encounter for routine gynecological examination 06/15/2012   Essential hypertension 02/10/2012   LOW BACK PAIN, CHRONIC 08/14/2008   HYPERCHOLESTEROLEMIA 01/28/2007   Anxiety disorder 01/28/2007   ALLERGIC RHINITIS 01/28/2007   Asthma 01/28/2007   TMJ SYNDROME 01/28/2007   MIGRAINES, HX OF 01/28/2007   Past Medical History:  Diagnosis Date   Allergy    Anxiety    Asthma    Breast cancer (HCC) 03/2022   right breast   Cancer (HCC) 03/2022   skin, basal cell   Depression 20+ years   Essential hypertension    History of kidney stones    Insomnia    Migraines    very rarely now, more prior to menopause   Skin cancer 8+ years ago   TMJ (dislocation of temporomandibular joint)    Past Surgical History:  Procedure Laterality Date   BREAST BIOPSY  2023   BREAST LUMPECTOMY WITH RADIOACTIVE SEED LOCALIZATION Right 04/01/2022   Procedure: RIGHT BREAST LUMPECTOMY WITH RADIOACTIVE SEED LOCALIZATION;  Surgeon: Curvin Deward MOULD, MD;  Location: [facility];  Service: General;  Laterality: Right;   COSMETIC SURGERY  Around 1974   rhinoplasty   HAND SURGERY Left 2021   schwannoma removal   HEMORRHOID SURGERY  11/04/2003   RE-EXCISION OF BREAST CANCER,SUPERIOR MARGINS Right 04/14/2022   Procedure: RE-EXCISION OF RIGHT BREAST ANTERIOR MARGIN;  Surgeon: Curvin Deward MOULD, MD;  Location: [facility];  Service: General;  Laterality: Right;   SKIN LESION EXCISION     TUBAL LIGATION  05/1983   Social History   Tobacco Use   Smoking status: Never   Smokeless tobacco: Never  Vaping Use   Vaping status: Never Used  Substance Use Topics   Alcohol use: Yes     Alcohol/week: 0.0 standard drinks of alcohol    Comment: 1-2 weekly   Drug use: No   Family History  Problem Relation Age of Onset   Depression Mother    Breast cancer Mother 51       negative genetic testing   Cancer Mother    Depression Father    Asthma Father    Hyperlipidemia Sister    Thyroid  cancer Sister 19   Cancer Sister    Hyperlipidemia Brother    Lung cancer Maternal Aunt    Breast cancer Maternal Grandmother    Heart attack Maternal Grandmother    Cancer Maternal Grandmother    Stomach cancer Paternal Grandfather 48   Cancer Paternal Grandfather    Nephrolithiasis Daughter    Colon polyps Daughter    Miscarriages / Stillbirths Daughter    Breast cancer Cousin 41       maternal first cousin   Cancer Maternal Aunt    Cancer Paternal Aunt    Cancer Paternal Aunt    No Known Allergies Current Outpatient Medications on File Prior to Visit  Medication Sig Dispense Refill   albuterol  (PROVENTIL  HFA;VENTOLIN  HFA) 108 (90 Base) MCG/ACT inhaler INHALE TWO PUFFS BY MOUTH EVERY 4 HOURS AS NEEDED FOR  WHEEZE 9 g 1   cetirizine (ZYRTEC) 10 MG tablet Take 1 tablet by mouth daily as needed (allergies).     chlorthalidone  (HYGROTON ) 25 MG tablet TAKE 1 TABLET BY MOUTH ONCE DAILY IN THE MORNING 90 tablet 1   famotidine  (PEPCID ) 20 MG tablet TAKE 1 TABLET BY MOUTH AT BEDTIME 90 tablet 1   letrozole  (FEMARA ) 2.5 MG tablet Take 1  tablet (2.5 mg total) by mouth daily. 90 tablet 0   Multiple Vitamin (MULTIVITAMIN) tablet Take 1 tablet by mouth daily.     Omega-3 Fatty Acids (FISH OIL PO) Take 1,400 mg by mouth daily.     sennosides-docusate sodium (SENOKOT-S) 8.6-50 MG tablet Take 1 tablet by mouth daily as needed for constipation.     sertraline  (ZOLOFT ) 50 MG tablet Take 1 tablet by mouth once daily 90 tablet 1   simvastatin  (ZOCOR ) 40 MG tablet Take 1 tablet by mouth once daily 90 tablet 0   zolpidem  (AMBIEN ) 10 MG tablet TAKE 1 TABLET BY MOUTH AT BEDTIME AS NEEDED FOR SLEEP 30  tablet 0   No current facility-administered medications on file prior to visit.    Review of Systems  Constitutional:  Negative for activity change, appetite change, fatigue, fever and unexpected weight change.  HENT:  Negative for congestion, ear pain, rhinorrhea, sinus pressure and sore throat.   Eyes:  Negative for pain, redness and visual disturbance.  Respiratory:  Negative for cough, shortness of breath and wheezing.   Cardiovascular:  Negative for chest pain and palpitations.  Gastrointestinal:  Negative for abdominal pain, blood in stool, constipation and diarrhea.  Endocrine: Negative for polydipsia and polyuria.  Genitourinary:  Negative for dysuria, frequency and urgency.  Musculoskeletal:  Negative for arthralgias, back pain and myalgias.  Skin:  Negative for pallor and rash.  Allergic/Immunologic: Negative for environmental allergies.  Neurological:  Negative for dizziness, syncope and headaches.  Hematological:  Negative for adenopathy. Does not bruise/bleed easily.  Psychiatric/Behavioral:  Negative for decreased concentration and dysphoric mood. The patient is not nervous/anxious.        Objective:   Physical Exam Constitutional:      General: She is not in acute distress.    Appearance: Normal appearance. She is well-developed and normal weight. She is not ill-appearing or diaphoretic.  HENT:     Head: Normocephalic and atraumatic.     Right Ear: Tympanic membrane, ear canal and external ear normal.     Left Ear: Tympanic membrane, ear canal and external ear normal.     Nose: Nose normal. No congestion.     Mouth/Throat:     Mouth: Mucous membranes are moist.     Pharynx: Oropharynx is clear. No posterior oropharyngeal erythema.  Eyes:     General: No scleral icterus.    Extraocular Movements: Extraocular movements intact.     Conjunctiva/sclera: Conjunctivae normal.     Pupils: Pupils are equal, round, and reactive to light.  Neck:     Thyroid : No  thyromegaly.     Vascular: No carotid bruit or JVD.  Cardiovascular:     Rate and Rhythm: Normal rate and regular rhythm.     Pulses: Normal pulses.     Heart sounds: Normal heart sounds.     No gallop.  Pulmonary:     Effort: Pulmonary effort is normal. No respiratory distress.     Breath sounds: Normal breath sounds. No wheezing.     Comments: Good air exch Chest:     Chest wall: No tenderness.  Abdominal:     General: Bowel sounds are normal. There is no distension or abdominal bruit.     Palpations: Abdomen is soft. There is no mass.     Tenderness: There is no abdominal tenderness.     Hernia: No hernia is present.  Genitourinary:    Comments: Breast exam done recently from surgeon   Musculoskeletal:  General: No tenderness. Normal range of motion.     Cervical back: Normal range of motion and neck supple. No rigidity. No muscular tenderness.     Right lower leg: No edema.     Left lower leg: No edema.     Comments: No kyphosis   Lymphadenopathy:     Cervical: No cervical adenopathy.  Skin:    General: Skin is warm and dry.     Coloration: Skin is not pale.     Findings: No erythema or rash.     Comments: Solar lentigines diffusely Scattered sks   Neurological:     Mental Status: She is alert. Mental status is at baseline.     Cranial Nerves: No cranial nerve deficit.     Motor: No abnormal muscle tone.     Coordination: Coordination normal.     Gait: Gait normal.     Deep Tendon Reflexes: Reflexes are normal and symmetric. Reflexes normal.  Psychiatric:        Mood and Affect: Mood normal.        Cognition and Memory: Cognition and memory normal.           Assessment & Plan:   Problem List Items Addressed This Visit       Cardiovascular and Mediastinum   Essential hypertension   bp in fair control at this time  BP Readings from Last 1 Encounters:  03/22/24 122/64   No changes needed Most recent labs reviewed  Disc lifstyle change with low  sodium diet and exercise   Losartan  100 mg daily  Chlorthalidone  25 mg daily        Relevant Medications   losartan  (COZAAR ) 100 MG tablet     Digestive   Hepatic steatosis   LFTs are improved Lab Results  Component Value Date   ALT 37 (H) 03/15/2024   AST 31 03/15/2024   ALKPHOS 82 03/15/2024   BILITOT 0.5 03/15/2024   Fibrosis 4 Score = 1.31  Fib-4 interpretation is not validated for people under 35 or over 94 years of age. However, scores under 2.0 are generally considered low risk.   Encouraged healthy diet /exercise           Musculoskeletal and Integument   Osteopenia   Dexa due after 06/22/24 at [facility] Order is in  Pt will schedule  On letrozole  now for breast cancer   Discussed fall prevention, supplements and exercise for bone density          Other   Routine general medical examination at a health care facility - Primary   Reviewed health habits including diet and exercise and skin cancer prevention Reviewed appropriate screening tests for age  Also reviewed health mt list, fam hx and immunization status , as well as social and family history   See HPI Labs reviewed and ordered Health Maintenance  Topic Date Due   Medicare Annual Wellness Visit  03/16/2024   COVID-19 Vaccine (8 - Pfizer risk 2024-25 season) 04/07/2026*   DTaP/Tdap/Td vaccine (3 - Td or Tdap) 08/13/2024   Breast Cancer Screening  03/10/2025   Colon Cancer Screening  07/04/2033   Pneumococcal Vaccine for age over 75  Completed   Flu Shot  Completed   DEXA scan (bone density measurement)  Completed   Hepatitis C Screening  Completed   Zoster (Shingles) Vaccine  Completed   HPV Vaccine  Aged Out   Meningitis B Vaccine  Aged Out  *Topic was postponed. The date shown is  not the original due date.    Flu shot today  Will get covid shot at pharmacy  [facility] breast cancer care Dexa ordered for December Discussed fall prevention, supplements and exercise for bone density  PHQ 3  with  treatment        Prediabetes   Prediabetes  Lab Results  Component Value Date   HGBA1C 6.2 03/15/2024   HGBA1C 5.7 08/18/2022   HGBA1C 5.7 07/15/2021    disc imp of low glycemic diet and wt loss to prevent DM2        Malignant neoplasm of lower-inner quadrant of right breast of female, estrogen receptor positive (HCC)   Has changed to letrizole from tamoxifen  Under onc care Recent exam normal at surgeon and recent normal mammo      HYPERCHOLESTEROLEMIA   Disc goals for lipids and reasons to control them Rev last labs with pt Rev low sat fat diet in detail Encouraged exercise for HDL LDL 79 Trig up -will watch sugar/carb intake  Enc her to continue fish oil and exercise   Simvastatin  40 mg daily        Relevant Medications   losartan  (COZAAR ) 100 MG tablet   Estrogen deficiency   Dexa ordered for dec       Relevant Orders   DG Bone Density   Elevated liver transaminase level   With fatty liver Better off tamoxifen   Lab Results  Component Value Date   ALT 37 (H) 03/15/2024   AST 31 03/15/2024   ALKPHOS 82 03/15/2024   BILITOT 0.5 03/15/2024         Anxiety disorder   Improved with zoloft  50 mg  Wants to continue this dose  Some increase stress with elder care  Reviewed stressors/ coping techniques/symptoms/ support sources/ tx options and side effects in detail today  Encouraged good self care       Other Visit Diagnoses       Need for influenza vaccination       Relevant Orders   Flu vaccine HIGH DOSE PF(Fluzone Trivalent) (Completed)

## 2024-03-22 NOTE — Assessment & Plan Note (Signed)
 bp in fair control at this time  BP Readings from Last 1 Encounters:  03/22/24 122/64   No changes needed Most recent labs reviewed  Disc lifstyle change with low sodium diet and exercise   Losartan  100 mg daily  Chlorthalidone  25 mg daily

## 2024-03-22 NOTE — Assessment & Plan Note (Signed)
 Prediabetes  Lab Results  Component Value Date   HGBA1C 6.2 03/15/2024   HGBA1C 5.7 08/18/2022   HGBA1C 5.7 07/15/2021    disc imp of low glycemic diet and wt loss to prevent DM2

## 2024-03-22 NOTE — Patient Instructions (Addendum)
 Try to get 1200-1500 mg of calcium per day with at least 2000 iu of vitamin D - for bone health If calcium constipates - then just take the D   Keep walking Add some strength training to your routine, this is important for bone and brain health and can reduce your risk of falls and help your body use insulin properly and regulate weight  Light weights, exercise bands , and internet videos are a good way to start  Yoga (chair or regular), machines , floor exercises or a gym with machines are also good options     To prevent diabetes Avoid added sugars  Try to get most of your carbohydrates from produce (with the exception of white potatoes) and whole grains Eat less bread/pasta/rice/snack foods/cereals/sweets and other items from the middle of the grocery store (processed carbs)     You have an order for:  []   3D Mammogram  [x]   Bone Density   (due after 06/22/24)     Please call for appointment:   []   [facility]  [address] [place] [address]  [phone]  []   [facility])   [address]  [place], [place] [address]  [phone]  []   [facility]      [address] [place], [place]        [phone]         [x]   [facility]  [address] [place], [place]  [phone]  []  [facility]   [address]   [place], [place] [address]  [phone]  []  [facility]  [address] [place], [place] [address] [phone]    Make sure to wear two piece clothing  No lotions powders or deodorants the day of the appointment Make sure to bring picture ID and insurance card.  Bring list of medications you are currently taking including any supplements.   Schedule your screening mammogram through MyChart!   Select  [place] imaging sites can now be scheduled through MyChart.  Log into your MyChart account.  Go to 'Visit' (or 'Appointments' if  on mobile App) --> Schedule an  Appointment  Under 'Select a Reason for Visit' choose the Mammogram  Screening option.  Complete the pre-visit questions  and select the time and place that  best fits your schedule

## 2024-03-22 NOTE — Assessment & Plan Note (Signed)
 LFTs are improved Lab Results  Component Value Date   ALT 37 (H) 03/15/2024   AST 31 03/15/2024   ALKPHOS 82 03/15/2024   BILITOT 0.5 03/15/2024   Fibrosis 4 Score = 1.31  Fib-4 interpretation is not validated for people under 35 or over 71 years of age. However, scores under 2.0 are generally considered low risk.   Encouraged healthy diet /exercise

## 2024-03-22 NOTE — Assessment & Plan Note (Signed)
 Dexa due after 06/22/24 at [facility] Order is in  Pt will schedule  On letrozole  now for breast cancer   Discussed fall prevention, supplements and exercise for bone density

## 2024-03-22 NOTE — Assessment & Plan Note (Signed)
 Has changed to letrizole from tamoxifen  Under onc care Recent exam normal at surgeon and recent normal mammo

## 2024-03-22 NOTE — Assessment & Plan Note (Signed)
 Improved with zoloft  50 mg  Wants to continue this dose  Some increase stress with elder care  Reviewed stressors/ coping techniques/symptoms/ support sources/ tx options and side effects in detail today  Encouraged good self care

## 2024-03-27 ENCOUNTER — Ambulatory Visit (INDEPENDENT_AMBULATORY_CARE_PROVIDER_SITE_OTHER): Payer: PPO | Admitting: *Deleted

## 2024-03-27 VITALS — Ht 64.5 in | Wt 143.0 lb

## 2024-03-27 DIAGNOSIS — Z0000 Encounter for general adult medical examination without abnormal findings: Secondary | ICD-10-CM | POA: Diagnosis not present

## 2024-03-27 NOTE — Patient Instructions (Signed)
 Taylor Ewing,  Thank you for taking the time for your Medicare Wellness Visit. I appreciate your continued commitment to your health goals. Please review the care plan we discussed, and feel free to reach out if I can assist you further.  Medicare recommends these wellness visits once per year to help you and your care team stay ahead of potential health issues. These visits are designed to focus on prevention, allowing your provider to concentrate on managing your acute and chronic conditions during your regular appointments.  Please note that Annual Wellness Visits do not include a physical exam. Some assessments may be limited, especially if the visit was conducted virtually. If needed, we may recommend a separate in-person follow-up with your provider.  Ongoing Care Seeing your primary care provider every 3 to 6 months helps us  monitor your health and provide consistent, personalized care.  Remember to call and schedule an eye exam and keep your appointment scheduled for your Dexa  Referrals If a referral was made during today's visit and you haven't received any updates within two weeks, please contact the referred provider directly to check on the status.  Recommended Screenings:  Health Maintenance  Topic Date Due   COVID-19 Vaccine (8 - Pfizer risk 2024-25 season) 04/07/2026*   DTaP/Tdap/Td vaccine (3 - Td or Tdap) 08/13/2024   Breast Cancer Screening  03/10/2025   Medicare Annual Wellness Visit  03/27/2025   Colon Cancer Screening  07/04/2033   Pneumococcal Vaccine for age over 92  Completed   Flu Shot  Completed   DEXA scan (bone density measurement)  Completed   Hepatitis C Screening  Completed   Zoster (Shingles) Vaccine  Completed   HPV Vaccine  Aged Out   Meningitis B Vaccine  Aged Out  *Topic was postponed. The date shown is not the original due date.       03/14/2024    9:29 AM  Advanced Directives  Does Patient Have a Medical Advance Directive? No  Would patient  like information on creating a medical advance directive? No - Patient declined   Advance Care Planning is important because it: Ensures you receive medical care that aligns with your values, goals, and preferences. Provides guidance to your family and loved ones, reducing the emotional burden of decision-making during critical moments.  Vision: Annual vision screenings are recommended for early detection of glaucoma, cataracts, and diabetic retinopathy. These exams can also reveal signs of chronic conditions such as diabetes and high blood pressure.  Dental: Annual dental screenings help detect early signs of oral cancer, gum disease, and other conditions linked to overall health, including heart disease and diabetes.  Please see the attached documents for additional preventive care recommendations.

## 2024-03-27 NOTE — Progress Notes (Signed)
 Subjective:   Taylor Ewing is a 73 y.o. who presents for a Medicare Wellness preventive visit.  As a reminder, Annual Wellness Visits don't include a physical exam, and some assessments may be limited, especially if this visit is performed virtually. We may recommend an in-person follow-up visit with your provider if needed.  Visit Complete: Virtual I connected with  Taylor Ewing on 03/27/24 by a audio enabled telemedicine application and verified that I am speaking with the correct person using two identifiers.  Patient Location: Home  Provider Location: Home Office  I discussed the limitations of evaluation and management by telemedicine. The patient expressed understanding and agreed to proceed.  Vital Signs: Because this visit was a virtual/telehealth visit, some criteria may be missing or patient reported. Any vitals not documented were not able to be obtained and vitals that have been documented are patient reported.  VideoDeclined- This patient declined Librarian, academic. Therefore the visit was completed with audio only.  Persons Participating in Visit: Patient.  AWV Questionnaire: Yes: Patient Medicare AWV questionnaire was completed by the patient on 03/24/24; I have confirmed that all information answered by patient is correct and no changes since this date.  Cardiac Risk Factors include: advanced age (>58men, >42 women);dyslipidemia;hypertension     Objective:    Today's Vitals   03/27/24 1205  Weight: 143 lb (64.9 kg)  Height: 5' 4.5 (1.638 m)   Body mass index is 24.17 kg/m.     03/14/2024    9:29 AM 03/17/2023   11:45 AM 07/16/2022   11:00 AM 06/03/2022   10:04 AM 04/27/2022    7:53 AM 04/01/2022   11:15 AM 03/20/2022    7:42 AM  Advanced Directives  Does Patient Have a Medical Advance Directive? No No No No No No No  Would patient like information on creating a medical advance directive? No - Patient declined  No -  Patient declined No - Patient declined No - Patient declined No - Patient declined     Current Medications (verified) Outpatient Encounter Medications as of 03/27/2024  Medication Sig   albuterol  (PROVENTIL  HFA;VENTOLIN  HFA) 108 (90 Base) MCG/ACT inhaler INHALE TWO PUFFS BY MOUTH EVERY 4 HOURS AS NEEDED FOR  WHEEZE   cetirizine (ZYRTEC) 10 MG tablet Take 1 tablet by mouth daily as needed (allergies).   chlorthalidone  (HYGROTON ) 25 MG tablet TAKE 1 TABLET BY MOUTH ONCE DAILY IN THE MORNING   famotidine  (PEPCID ) 20 MG tablet TAKE 1 TABLET BY MOUTH AT BEDTIME   letrozole  (FEMARA ) 2.5 MG tablet Take 1 tablet (2.5 mg total) by mouth daily.   losartan  (COZAAR ) 100 MG tablet Take 1 tablet (100 mg total) by mouth daily.   Multiple Vitamin (MULTIVITAMIN) tablet Take 1 tablet by mouth daily.   Omega-3 Fatty Acids (FISH OIL PO) Take 1,400 mg by mouth daily.   sennosides-docusate sodium (SENOKOT-S) 8.6-50 MG tablet Take 1 tablet by mouth daily as needed for constipation.   sertraline  (ZOLOFT ) 50 MG tablet Take 1 tablet by mouth once daily   simvastatin  (ZOCOR ) 40 MG tablet Take 1 tablet by mouth once daily   zolpidem  (AMBIEN ) 10 MG tablet TAKE 1 TABLET BY MOUTH AT BEDTIME AS NEEDED FOR SLEEP   No facility-administered encounter medications on file as of 03/27/2024.    Allergies (verified) Patient has no known allergies.   History: Past Medical History:  Diagnosis Date   Allergy    Anxiety    Asthma    Breast  cancer (HCC) 03/2022   right breast   Cancer (HCC) 03/2022   skin, basal cell   Depression 20+ years   Essential hypertension    History of kidney stones    Insomnia    Migraines    very rarely now, more prior to menopause   Skin cancer 8+ years ago   TMJ (dislocation of temporomandibular joint)    Past Surgical History:  Procedure Laterality Date   BREAST BIOPSY  2023   BREAST LUMPECTOMY WITH RADIOACTIVE SEED LOCALIZATION Right 04/01/2022   Procedure: RIGHT BREAST LUMPECTOMY  WITH RADIOACTIVE SEED LOCALIZATION;  Surgeon: Curvin Deward MOULD, MD;  Location: [facility];  Service: General;  Laterality: Right;   COSMETIC SURGERY  Around 1974   rhinoplasty   HAND SURGERY Left 2021   schwannoma removal   HEMORRHOID SURGERY  11/04/2003   RE-EXCISION OF BREAST CANCER,SUPERIOR MARGINS Right 04/14/2022   Procedure: RE-EXCISION OF RIGHT BREAST ANTERIOR MARGIN;  Surgeon: Curvin Deward MOULD, MD;  Location: [facility];  Service: General;  Laterality: Right;   SKIN LESION EXCISION     TUBAL LIGATION  05/1983   Family History  Problem Relation Age of Onset   Depression Mother    Breast cancer Mother 62       negative genetic testing   Cancer Mother    Depression Father    Asthma Father    Hyperlipidemia Sister    Thyroid  cancer Sister 26   Cancer Sister    Hyperlipidemia Brother    Lung cancer Maternal Aunt    Breast cancer Maternal Grandmother    Heart attack Maternal Grandmother    Cancer Maternal Grandmother    Stomach cancer Paternal Grandfather 20   Cancer Paternal Grandfather    Nephrolithiasis Daughter    Colon polyps Daughter    Miscarriages / Stillbirths Daughter    Breast cancer Cousin 76       maternal first cousin   Cancer Maternal Aunt    Cancer Paternal Aunt    Cancer Paternal Aunt    Social History   Socioeconomic History   Marital status: Divorced    Spouse name: Not on file   Number of children: 2   Years of education: Not on file   Highest education level: Bachelor's degree (e.g., BA, AB, BS)  Occupational History   Not on file  Tobacco Use   Smoking status: Never   Smokeless tobacco: Never  Vaping Use   Vaping status: Never Used  Substance and Sexual Activity   Alcohol use: Yes    Alcohol/week: 0.0 standard drinks of alcohol    Comment: 1-2 weekly   Drug use: No   Sexual activity: Yes  Other Topics Concern   Not on file  Social History Narrative   Not on file   Social Drivers of Health   Financial Resource Strain: Low  Risk  (03/24/2024)   Overall Financial Resource Strain (CARDIA)    Difficulty of Paying Living Expenses: Not hard at all  Food Insecurity: No Food Insecurity (03/24/2024)   Hunger Vital Sign    Worried About Running Out of Food in the Last Year: Never true    Ran Out of Food in the Last Year: Never true  Transportation Needs: No Transportation Needs (03/24/2024)   PRAPARE - Administrator, Civil Service (Medical): No    Lack of Transportation (Non-Medical): No  Physical Activity: Sufficiently Active (03/24/2024)   Exercise Vital Sign    Days of Exercise per  Week: 4 days    Minutes of Exercise per Session: 50 min  Stress: No Stress Concern Present (03/24/2024)   [employer] of Occupational Health - Occupational Stress Questionnaire    Feeling of Stress: Only a little  Social Connections: Socially Isolated (03/24/2024)   Social Connection and Isolation Panel    Frequency of Communication with Friends and Family: Three times a week    Frequency of Social Gatherings with Friends and Family: Twice a week    Attends Religious Services: Never    Database administrator or Organizations: No    Attends Engineer, structural: Never    Marital Status: Divorced    Tobacco Counseling Counseling given: Not Answered    Clinical Intake:  Pre-visit preparation completed: Yes  Pain : No/denies pain     BMI - recorded: 24.17 Nutritional Status: BMI of 19-24  Normal Nutritional Risks: None Diabetes: No  Lab Results  Component Value Date   HGBA1C 6.2 03/15/2024   HGBA1C 5.7 08/18/2022   HGBA1C 5.7 07/15/2021     How often do you need to have someone help you when you read instructions, pamphlets, or other written materials from your doctor or pharmacy?: 1 - Never  Interpreter Needed?: No  Information entered by :: R. Hollyn Stucky LPN   Activities of Daily Living     03/24/2024    8:48 PM  In your present state of health, do you have any difficulty performing the  following activities:  Hearing? 0  Vision? 0  Difficulty concentrating or making decisions? 0  Walking or climbing stairs? 0  Dressing or bathing? 0  Doing errands, shopping? 0  Preparing Food and eating ? N  Using the Toilet? N  In the past six months, have you accidently leaked urine? N  Do you have problems with loss of bowel control? N  Managing your Medications? N  Managing your Finances? N  Housekeeping or managing your Housekeeping? N    Patient Care Team: Tower, Laine LABOR, MD as PCP - General Izell Domino, MD as Attending Physician (Radiation Oncology) Loretha Ash, MD as Consulting Physician (Hematology and Oncology) Curvin Deward MOULD, MD as Consulting Physician (General Surgery) [facility] [place]  I have updated your Care Teams any recent Medical Services you may have received from other providers in the past year.     Assessment:   This is a routine wellness examination for [place].  Hearing/Vision screen Hearing Screening - Comments:: No issues Vision Screening - Comments:: glasses   Goals Addressed             This Visit's Progress    Patient Stated       Wants to get into more of a regular exercise program        Depression Screen     03/27/2024   12:09 PM 03/22/2024    9:52 AM 03/14/2024    9:00 AM 08/04/2023    9:39 AM 06/08/2023   11:06 AM 03/17/2023   11:46 AM 08/25/2022   10:24 AM  PHQ 2/9 Scores  PHQ - 2 Score 2 2 2 2 2  0 0  PHQ- 9 Score 6 6 3 4 5  0 2    Fall Risk     03/24/2024    8:48 PM 03/22/2024    9:52 AM 08/04/2023    9:39 AM 06/08/2023   11:06 AM 03/14/2023    9:37 PM  Fall Risk   Falls in the past year? 0 0 1 1  0  Number falls in past yr: 0 0 0 0 0  Injury with Fall? 0 0 0 0 0  Risk for fall due to : No Fall Risks No Fall Risks History of fall(s) History of fall(s) Medication side effect  Follow up Falls evaluation completed;Falls prevention discussed Falls evaluation completed Falls evaluation completed Falls evaluation  completed Falls prevention discussed;Falls evaluation completed    MEDICARE RISK AT HOME:  Medicare Risk at Home Any stairs in or around the home?: (Patient-Rptd) Yes If so, are there any without handrails?: (Patient-Rptd) No Home free of loose throw rugs in walkways, pet beds, electrical cords, etc?: (Patient-Rptd) Yes Adequate lighting in your home to reduce risk of falls?: (Patient-Rptd) Yes Life alert?: (Patient-Rptd) No Use of a cane, walker or w/c?: (Patient-Rptd) No Grab bars in the bathroom?: (Patient-Rptd) No Shower chair or bench in shower?: (Patient-Rptd) No Elevated toilet seat or a handicapped toilet?: (Patient-Rptd) No  TIMED UP AND GO:  Was the test performed?  No  Cognitive Function: 6CIT completed    11/10/2019    9:12 AM  MMSE - Mini Mental State Exam  Orientation to time 5  Orientation to Place 5  Registration 3  Attention/ Calculation 5  Recall 3  Language- repeat 1        03/27/2024   12:16 PM 03/17/2023   11:47 AM 06/03/2022   10:14 AM  6CIT Screen  What Year? 0 points 0 points 0 points  What month? 0 points 0 points 0 points  What time? 0 points 0 points 0 points  Count back from 20 0 points 0 points 0 points  Months in reverse 0 points 0 points 0 points  Repeat phrase 0 points 2 points 0 points  Total Score 0 points 2 points 0 points    Immunizations Immunization History  Administered Date(s) Administered   Fluad Quad(high Dose 65+) 06/06/2019, 04/24/2022, 03/11/2023   INFLUENZA, HIGH DOSE SEASONAL PF 07/12/2018, 03/11/2023, 03/22/2024   Influenza Split 06/15/2012   Influenza,inj,Quad PF,6+ Mos 08/13/2014, 09/03/2015   Influenza-Unspecified 05/15/2021   Moderna Covid-19 Fall Seasonal Vaccine 41yrs & older 04/24/2022, 03/11/2023   PFIZER Comirnaty(Gray Top)Covid-19 Tri-Sucrose Vaccine 12/20/2020   PFIZER(Purple Top)SARS-COV-2 Vaccination 07/27/2019, 08/17/2019, 04/19/2020, 12/20/2020   Pfizer Covid-19 Vaccine Bivalent Booster 61yrs & up  04/24/2022   Pneumococcal Conjugate-13 06/06/2019   Pneumococcal Polysaccharide-23 11/13/2019   Respiratory Syncytial Virus Vaccine,Recomb Aduvanted(Arexvy) 04/24/2022   Td 07/30/2003   Tdap 08/13/2014   Unspecified SARS-COV-2 Vaccination 03/11/2023   Zoster Recombinant(Shingrix) 06/06/2019, 10/24/2019    Screening Tests Health Maintenance  Topic Date Due   Medicare Annual Wellness (AWV)  03/16/2024   COVID-19 Vaccine (8 - Pfizer risk 2024-25 season) 04/07/2026 (Originally 03/06/2024)   DTaP/Tdap/Td (3 - Td or Tdap) 08/13/2024   Mammogram  03/10/2025   Colonoscopy  07/04/2033   Pneumococcal Vaccine: 50+ Years  Completed   Influenza Vaccine  Completed   DEXA SCAN  Completed   Hepatitis C Screening  Completed   Zoster Vaccines- Shingrix  Completed   HPV VACCINES  Aged Out   Meningococcal B Vaccine  Aged Out    Health Maintenance Items Addressed: Discussed the order placed for Dexa 03/22/24. Patient has scheduled an appointment.   Additional Screening:  Vision Screening: Recommended annual ophthalmology exams for early detection of glaucoma and other disorders of the eye. Is the patient up to date with their annual eye exam?  No  Who is the provider or what is the name of the office in  which the patient attends annual eye exams? [facility]    Patient stated that she will call and schedule an eye appointment.  Dental Screening: Recommended annual dental exams for proper oral hygiene  Community Resource Referral / Chronic Care Management: CRR required this visit?  No   CCM required this visit?  No   Plan:    I have personally reviewed and noted the following in the patient's chart:   Medical and social history Use of alcohol, tobacco or illicit drugs  Current medications and supplements including opioid prescriptions. Patient is not currently taking opioid prescriptions. Functional ability and status Nutritional status Physical activity Advanced directives List of  other physicians Hospitalizations, surgeries, and ER visits in previous 12 months Vitals Screenings to include cognitive, depression, and falls Referrals and appointments  In addition, I have reviewed and discussed with patient certain preventive protocols, quality metrics, and best practice recommendations. A written personalized care plan for preventive services as well as general preventive health recommendations were provided to patient.   Angeline Fredericks, LPN   [id number]   After Visit Summary: (MyChart) Due to this being a telephonic visit, the after visit summary with patients personalized plan was offered to patient via MyChart   Notes: Nothing significant to report at this time.

## 2024-04-02 ENCOUNTER — Other Ambulatory Visit: Payer: Self-pay | Admitting: Family Medicine

## 2024-04-03 NOTE — Telephone Encounter (Signed)
 Name of Medication: Ambien   Name of Pharmacy: [facility] [address] Last Fill or Written Date and Quantity: 02/03/24 #30 tab/ 0 refills  Last Office Visit and Type: CPE 03/22/24 Next Office Visit and Type: nothing scheduled

## 2024-04-28 ENCOUNTER — Other Ambulatory Visit: Payer: Self-pay | Admitting: Family Medicine

## 2024-05-23 ENCOUNTER — Other Ambulatory Visit: Payer: Self-pay | Admitting: Family Medicine

## 2024-05-23 NOTE — Telephone Encounter (Signed)
 Medication: Ambien  Directions: TAKE 1 TABLET BY MOUTH AT BEDTIME AS NEEDED FOR SLEEP  Last given: 04/03/2024 Number refills: 0 Last o/v:  Follow up:  Labs:     Please review refill request.

## 2024-05-31 ENCOUNTER — Other Ambulatory Visit: Payer: Self-pay | Admitting: Family Medicine

## 2024-06-06 ENCOUNTER — Inpatient Hospital Stay: Attending: Hematology and Oncology | Admitting: Hematology and Oncology

## 2024-06-06 ENCOUNTER — Telehealth: Payer: Self-pay | Admitting: Hematology and Oncology

## 2024-06-06 DIAGNOSIS — D0511 Intraductal carcinoma in situ of right breast: Secondary | ICD-10-CM | POA: Diagnosis not present

## 2024-06-06 MED ORDER — LETROZOLE 2.5 MG PO TABS: 2.5000 mg | ORAL_TABLET | Freq: Every day | ORAL | 3 refills | Status: AC

## 2024-06-06 NOTE — Progress Notes (Signed)
 BRIEF ONCOLOGIC HISTORY:  Oncology History  Ductal carcinoma in situ (DCIS) of right breast  02/23/2022 Mammogram   3D screening mammogram showed indeterminate right breast calcifications.   03/12/2022 Pathology Results   Pathology from the right breast lumpectomy showed intermediate grade DCIS, negative for invasive carcinoma.  Prognostic showed ER 95% positive strong staining PR 95% positive strong staining   03/20/2022 Initial Diagnosis   Ductal carcinoma in situ (DCIS) of right breast   04/01/2022 Definitive Surgery   She had right breast lumpectomy on September 27 which once again showed a 38 mm measuring DCIS, intermediate grade, foci of atypical ductal epithelium seen at the anterior margin and close proximity to DCIS.  Questionable positive margin.    Genetic Testing   Ambry CustomNext+RNA was Negative. Report date is 05/27/2022.  The CustomNext gene panel offered by [employer] includes sequencing, rearrangement analysis, and RNA analysis for the following 38 genes:  APC, ATM, AXIN2, BARD1, BMPR1A, BRCA1, BRCA2, BRIP1, CDH1, CDK4, CDKN2A, CHEK2, DICER1, HOXB13, EPCAM, GREM1, MLH1, MSH2, MSH3, MSH6, MUTYH, NBN, NF1, NTHL1, PALB2, PMS2, POLD1, POLE, PRKAR1A, PTEN, RAD51C, RAD51D, RECQL, RET, SMAD4, SMARCA4, STK11, and TP53.    05/11/2022 - 06/09/2022 Radiation Therapy   Right breast:  40.05 Gy in 15 treatments "Boost": 10 Gy in 5 treatments    06/2022 -  Anti-estrogen oral therapy   Tamoxifen    09/21/2022 Cancer Staging   Staging form: Breast, AJCC 8th Edition - Pathologic: Stage 0 (pTis (DCIS), pN0, cM0) - Signed by Crawford Morna Pickle, NP on 09/21/2022 Stage prefix: Initial diagnosis     INTERVAL HISTORY  Discussed the use of AI scribe software for clinical note transcription with the patient, who gave verbal consent to proceed.  History of Present Illness Taylor Ewing is a 73 year old female with early stage breast cancer who presents for telephone follow  up. Since her last visit, she has been doing well. She is tolerating letrozole  extremely well, no issues at all She has been taking this for almost 3 months now. She hopes to continue letrozole  No other health issues reported.  Rest of the pertinent 10 point ROS reviewed and neg.   PAST MEDICAL/SURGICAL HISTORY:  Past Medical History:  Diagnosis Date   Allergy    Anxiety    Asthma    Breast cancer (HCC) 03/2022   right breast   Cancer (HCC) 03/2022   skin, basal cell   Depression 20+ years   Essential hypertension    History of kidney stones    Insomnia    Migraines    very rarely now, more prior to menopause   Skin cancer 8+ years ago   TMJ (dislocation of temporomandibular joint)    Past Surgical History:  Procedure Laterality Date   BREAST BIOPSY  2023   BREAST LUMPECTOMY WITH RADIOACTIVE SEED LOCALIZATION Right 04/01/2022   Procedure: RIGHT BREAST LUMPECTOMY WITH RADIOACTIVE SEED LOCALIZATION;  Surgeon: Curvin Deward MOULD, MD;  Location: [facility];  Service: General;  Laterality: Right;   COSMETIC SURGERY  Around 1974   rhinoplasty   HAND SURGERY Left 2021   schwannoma removal   HEMORRHOID SURGERY  11/04/2003   RE-EXCISION OF BREAST CANCER,SUPERIOR MARGINS Right 04/14/2022   Procedure: RE-EXCISION OF RIGHT BREAST ANTERIOR MARGIN;  Surgeon: Curvin Deward MOULD, MD;  Location: [facility];  Service: General;  Laterality: Right;   SKIN LESION EXCISION     TUBAL LIGATION  05/1983     ALLERGIES:  No Known Allergies  CURRENT MEDICATIONS:  Outpatient Encounter Medications as of 06/06/2024  Medication Sig   albuterol  (PROVENTIL  HFA;VENTOLIN  HFA) 108 (90 Base) MCG/ACT inhaler INHALE TWO PUFFS BY MOUTH EVERY 4 HOURS AS NEEDED FOR  WHEEZE   cetirizine (ZYRTEC) 10 MG tablet Take 1 tablet by mouth daily as needed (allergies).   chlorthalidone  (HYGROTON ) 25 MG tablet TAKE 1 TABLET BY MOUTH ONCE DAILY IN THE MORNING   famotidine  (PEPCID ) 20 MG tablet TAKE 1 TABLET BY MOUTH  AT BEDTIME   letrozole  (FEMARA ) 2.5 MG tablet Take 1 tablet (2.5 mg total) by mouth daily.   losartan  (COZAAR ) 100 MG tablet Take 1 tablet (100 mg total) by mouth daily.   Multiple Vitamin (MULTIVITAMIN) tablet Take 1 tablet by mouth daily.   Omega-3 Fatty Acids (FISH OIL PO) Take 1,400 mg by mouth daily.   sennosides-docusate sodium (SENOKOT-S) 8.6-50 MG tablet Take 1 tablet by mouth daily as needed for constipation.   sertraline  (ZOLOFT ) 50 MG tablet Take 1 tablet by mouth once daily   simvastatin  (ZOCOR ) 40 MG tablet Take 1 tablet by mouth once daily   zolpidem  (AMBIEN ) 10 MG tablet TAKE 1 TABLET BY MOUTH AT BEDTIME AS NEEDED FOR SLEEP   [DISCONTINUED] letrozole  (FEMARA ) 2.5 MG tablet Take 1 tablet (2.5 mg total) by mouth daily.   No facility-administered encounter medications on file as of 06/06/2024.     ONCOLOGIC FAMILY HISTORY:  Family History  Problem Relation Age of Onset   Depression Mother    Breast cancer Mother 50       negative genetic testing   Cancer Mother    Depression Father    Asthma Father    Hyperlipidemia Sister    Thyroid  cancer Sister 58   Cancer Sister    Hyperlipidemia Brother    Lung cancer Maternal Aunt    Breast cancer Maternal Grandmother    Heart attack Maternal Grandmother    Cancer Maternal Grandmother    Stomach cancer Paternal Grandfather 77   Cancer Paternal Grandfather    Nephrolithiasis Daughter    Colon polyps Daughter    Miscarriages / Stillbirths Daughter    Breast cancer Cousin 73       maternal first cousin   Cancer Maternal Aunt    Cancer Paternal Aunt    Cancer Paternal Aunt      SOCIAL HISTORY:  Social History   Socioeconomic History   Marital status: Divorced    Spouse name: Not on file   Number of children: 2   Years of education: Not on file   Highest education level: Bachelor's degree (e.g., BA, AB, BS)  Occupational History   Not on file  Tobacco Use   Smoking status: Never   Smokeless tobacco: Never  Vaping  Use   Vaping status: Never Used  Substance and Sexual Activity   Alcohol use: Yes    Alcohol/week: 0.0 standard drinks of alcohol    Comment: 1-2 weekly   Drug use: No   Sexual activity: Yes  Other Topics Concern   Not on file  Social History Narrative   Not on file   Social Drivers of Health   Financial Resource Strain: Low Risk  (03/24/2024)   Overall Financial Resource Strain (CARDIA)    Difficulty of Paying Living Expenses: Not hard at all  Food Insecurity: No Food Insecurity (03/24/2024)   Hunger Vital Sign    Worried About Running Out of Food in the Last Year: Never true    Ran Out of Food  in the Last Year: Never true  Transportation Needs: No Transportation Needs (03/24/2024)   PRAPARE - Administrator, Civil Service (Medical): No    Lack of Transportation (Non-Medical): No  Physical Activity: Sufficiently Active (03/24/2024)   Exercise Vital Sign    Days of Exercise per Week: 4 days    Minutes of Exercise per Session: 50 min  Stress: No Stress Concern Present (03/24/2024)   [employer] of Occupational Health - Occupational Stress Questionnaire    Feeling of Stress: Only a little  Social Connections: Socially Isolated (03/24/2024)   Social Connection and Isolation Panel    Frequency of Communication with Friends and Family: Three times a week    Frequency of Social Gatherings with Friends and Family: Twice a week    Attends Religious Services: Never    Database Administrator or Organizations: No    Attends Banker Meetings: Never    Marital Status: Divorced  Catering Manager Violence: Not At Risk (03/27/2024)   Humiliation, Afraid, Rape, and Kick questionnaire    Fear of Current or Ex-Partner: No    Emotionally Abused: No    Physically Abused: No    Sexually Abused: No     OBSERVATIONS/OBJECTIVE:    VS and PE not done, telephone visit   LABORATORY DATA:  None for this visit.  DIAGNOSTIC IMAGING:  None for this visit.       ASSESSMENT AND PLAN:  Ms.. Epstein is a pleasant 73 y.o. female with Stage 0 right breast DCIS, ER+/PR+, diagnosed in 02/2022, treated with lumpectomy, adjuvant radiation therapy, and anti-estrogen therapy with Tamoxifen  beginning in 06/2022.    Assessment & Plan Ductal carcinoma in situ of breast, post-treatment DCIS surgically removed and treated with radiation. Tamoxifen  discontinued due to side effects. She is now on letrozole , tolerating it well. She denies any new complaints at all. She wants to continue letrozole  and requests a refill  Osteopenia Mild osteopenia noted. Letrozole  may affect bone density, requiring monitoring. Scheduled for it on 12/19.  I connected with  Inocente LELON Gibson on 06/06/24 by a telephone application and verified that I am speaking with the correct person using two identifiers.   I discussed the limitations of evaluation and management by telemedicine. The patient expressed understanding and agreed to proceed.  Location of pt: Home Location of provider: office.  Thank you for consulting us  in the care of this patient.  Please do not hesitate to contact us  with any additional questions or concerns  *Total Encounter Time as defined by the Centers for Medicare and Medicaid Services includes, in addition to the face-to-face time of a patient visit (documented in the note above) non-face-to-face time: obtaining and reviewing outside history, ordering and reviewing medications, tests or procedures, care coordination (communications with other health care professionals or caregivers) and documentation in the medical record.

## 2024-06-06 NOTE — Telephone Encounter (Signed)
 I left voicemail for patient to return my call if date/time does not work for scheduled 6 month lab and MD follow up on 12/05/2024.

## 2024-06-26 ENCOUNTER — Telehealth: Payer: Self-pay

## 2024-06-26 NOTE — Telephone Encounter (Signed)
 Patient called requesting a letter for jury duty. Patient reports her father recently passed away and she is the primary caregiver for her mother. A letter excusing the patient from jury duty was generated and placed at the front desk for patient pickup.

## 2024-07-17 LAB — HM DEXA SCAN

## 2024-07-19 ENCOUNTER — Ambulatory Visit: Payer: Self-pay | Admitting: Family Medicine

## 2024-07-19 ENCOUNTER — Encounter: Payer: Self-pay | Admitting: Family Medicine

## 2024-12-05 ENCOUNTER — Inpatient Hospital Stay: Admitting: Hematology and Oncology

## 2024-12-05 ENCOUNTER — Inpatient Hospital Stay: Attending: Hematology and Oncology

## 2025-03-28 ENCOUNTER — Ambulatory Visit
